# Patient Record
Sex: Female | Born: 1935 | Race: White | Hispanic: No | Marital: Married | Smoking: Former smoker
Health system: Southern US, Community
[De-identification: ages and names within clinical notes are randomized; demographics above are authoritative.]

## PROBLEM LIST (undated history)

## (undated) DIAGNOSIS — K2100 Gastro-esophageal reflux disease with esophagitis, without bleeding: Secondary | ICD-10-CM

## (undated) DIAGNOSIS — R002 Palpitations: Secondary | ICD-10-CM

## (undated) DIAGNOSIS — R739 Hyperglycemia, unspecified: Secondary | ICD-10-CM

## (undated) DIAGNOSIS — F419 Anxiety disorder, unspecified: Secondary | ICD-10-CM

## (undated) DIAGNOSIS — I10 Essential (primary) hypertension: Secondary | ICD-10-CM

## (undated) DIAGNOSIS — R9389 Abnormal findings on diagnostic imaging of other specified body structures: Secondary | ICD-10-CM

## (undated) DIAGNOSIS — M810 Age-related osteoporosis without current pathological fracture: Secondary | ICD-10-CM

## (undated) DIAGNOSIS — M1990 Unspecified osteoarthritis, unspecified site: Secondary | ICD-10-CM

## (undated) DIAGNOSIS — K219 Gastro-esophageal reflux disease without esophagitis: Secondary | ICD-10-CM

## (undated) DIAGNOSIS — Z862 Personal history of diseases of the blood and blood-forming organs and certain disorders involving the immune mechanism: Secondary | ICD-10-CM

## (undated) DIAGNOSIS — E7800 Pure hypercholesterolemia, unspecified: Secondary | ICD-10-CM

## (undated) DIAGNOSIS — L409 Psoriasis, unspecified: Secondary | ICD-10-CM

## (undated) DIAGNOSIS — N6019 Diffuse cystic mastopathy of unspecified breast: Secondary | ICD-10-CM

## (undated) DIAGNOSIS — R7303 Prediabetes: Secondary | ICD-10-CM

## (undated) HISTORY — DX: Abnormal findings on diagnostic imaging of other specified body structures: R93.89

## (undated) HISTORY — DX: Essential (primary) hypertension: I10

## (undated) HISTORY — DX: Unspecified osteoarthritis, unspecified site: M19.90

## (undated) HISTORY — DX: Gastro-esophageal reflux disease without esophagitis: K21.9

## (undated) HISTORY — DX: Age-related osteoporosis without current pathological fracture: M81.0

## (undated) HISTORY — DX: Psoriasis, unspecified: L40.9

## (undated) HISTORY — DX: Hyperglycemia, unspecified: R73.9

## (undated) HISTORY — DX: Anxiety disorder, unspecified: F41.9

## (undated) HISTORY — DX: Pure hypercholesterolemia, unspecified: E78.00

## (undated) HISTORY — DX: Diffuse cystic mastopathy of unspecified breast: N60.19

## (undated) HISTORY — DX: Personal history of diseases of the blood and blood-forming organs and certain disorders involving the immune mechanism: Z86.2

---

## 1974-11-29 HISTORY — PX: BREAST EXCISIONAL BIOPSY: SUR124

## 1978-11-29 HISTORY — PX: ABDOMINAL HYSTERECTOMY: SHX81

## 1998-08-20 ENCOUNTER — Other Ambulatory Visit: Admission: RE | Admit: 1998-08-20 | Discharge: 1998-08-20 | Payer: Self-pay | Admitting: Gynecology

## 1998-08-22 ENCOUNTER — Other Ambulatory Visit: Admission: RE | Admit: 1998-08-22 | Discharge: 1998-08-22 | Payer: Self-pay | Admitting: Gynecology

## 1999-08-25 ENCOUNTER — Other Ambulatory Visit: Admission: RE | Admit: 1999-08-25 | Discharge: 1999-08-25 | Payer: Self-pay | Admitting: Gynecology

## 2000-08-24 ENCOUNTER — Other Ambulatory Visit: Admission: RE | Admit: 2000-08-24 | Discharge: 2000-08-24 | Payer: Self-pay | Admitting: Gynecology

## 2001-08-22 ENCOUNTER — Other Ambulatory Visit: Admission: RE | Admit: 2001-08-22 | Discharge: 2001-08-22 | Payer: Self-pay | Admitting: Gynecology

## 2002-08-29 ENCOUNTER — Other Ambulatory Visit: Admission: RE | Admit: 2002-08-29 | Discharge: 2002-08-29 | Payer: Self-pay | Admitting: Gynecology

## 2003-09-09 ENCOUNTER — Other Ambulatory Visit: Admission: RE | Admit: 2003-09-09 | Discharge: 2003-09-09 | Payer: Self-pay | Admitting: Gynecology

## 2004-10-14 ENCOUNTER — Other Ambulatory Visit: Admission: RE | Admit: 2004-10-14 | Discharge: 2004-10-14 | Payer: Self-pay | Admitting: Gynecology

## 2005-02-04 ENCOUNTER — Ambulatory Visit: Payer: Self-pay | Admitting: Internal Medicine

## 2005-03-08 ENCOUNTER — Ambulatory Visit: Payer: Self-pay | Admitting: Internal Medicine

## 2005-09-15 ENCOUNTER — Ambulatory Visit: Payer: Self-pay | Admitting: Internal Medicine

## 2005-10-25 ENCOUNTER — Other Ambulatory Visit: Admission: RE | Admit: 2005-10-25 | Discharge: 2005-10-25 | Payer: Self-pay | Admitting: Gynecology

## 2005-12-28 ENCOUNTER — Ambulatory Visit: Payer: Self-pay | Admitting: Internal Medicine

## 2006-02-24 ENCOUNTER — Ambulatory Visit: Payer: Self-pay | Admitting: Internal Medicine

## 2006-03-21 ENCOUNTER — Ambulatory Visit: Payer: Self-pay | Admitting: Internal Medicine

## 2006-08-30 ENCOUNTER — Ambulatory Visit: Payer: Self-pay | Admitting: Internal Medicine

## 2006-11-07 ENCOUNTER — Other Ambulatory Visit: Admission: RE | Admit: 2006-11-07 | Discharge: 2006-11-07 | Payer: Self-pay | Admitting: Gynecology

## 2007-02-27 ENCOUNTER — Ambulatory Visit: Payer: Self-pay | Admitting: Internal Medicine

## 2007-09-06 ENCOUNTER — Ambulatory Visit: Payer: Self-pay | Admitting: Internal Medicine

## 2007-11-13 ENCOUNTER — Other Ambulatory Visit: Admission: RE | Admit: 2007-11-13 | Discharge: 2007-11-13 | Payer: Self-pay | Admitting: Gynecology

## 2008-02-05 ENCOUNTER — Ambulatory Visit: Payer: Self-pay | Admitting: Unknown Physician Specialty

## 2008-02-28 ENCOUNTER — Ambulatory Visit: Payer: Self-pay | Admitting: Internal Medicine

## 2008-04-24 ENCOUNTER — Ambulatory Visit: Payer: Self-pay | Admitting: Rheumatology

## 2008-11-05 ENCOUNTER — Ambulatory Visit: Payer: Self-pay | Admitting: Internal Medicine

## 2008-11-15 ENCOUNTER — Ambulatory Visit: Payer: Self-pay | Admitting: Family Medicine

## 2008-11-18 ENCOUNTER — Emergency Department: Payer: Self-pay | Admitting: Emergency Medicine

## 2008-12-11 ENCOUNTER — Other Ambulatory Visit: Admission: RE | Admit: 2008-12-11 | Discharge: 2008-12-11 | Payer: Self-pay | Admitting: Gynecology

## 2008-12-25 ENCOUNTER — Ambulatory Visit: Payer: Self-pay | Admitting: Unknown Physician Specialty

## 2009-03-03 ENCOUNTER — Ambulatory Visit: Payer: Self-pay | Admitting: Internal Medicine

## 2009-03-19 ENCOUNTER — Ambulatory Visit: Payer: Self-pay | Admitting: Unknown Physician Specialty

## 2009-09-03 ENCOUNTER — Ambulatory Visit: Payer: Self-pay | Admitting: Surgery

## 2010-03-09 ENCOUNTER — Ambulatory Visit: Payer: Self-pay | Admitting: Internal Medicine

## 2010-06-22 ENCOUNTER — Ambulatory Visit: Payer: Self-pay | Admitting: Family Medicine

## 2010-06-22 DIAGNOSIS — T7840XA Allergy, unspecified, initial encounter: Secondary | ICD-10-CM | POA: Insufficient documentation

## 2010-06-22 DIAGNOSIS — E7800 Pure hypercholesterolemia, unspecified: Secondary | ICD-10-CM | POA: Insufficient documentation

## 2010-06-22 DIAGNOSIS — I10 Essential (primary) hypertension: Secondary | ICD-10-CM

## 2010-12-29 NOTE — Assessment & Plan Note (Signed)
Summary: sore throat/jbb   Vital Signs:  Patient Profile:   77 Years Old Female CC:      Sore Throat / rwt Height:     58.5 inches Weight:      157 pounds BMI:     32.37 O2 Sat:      97 % O2 treatment:    Room Air Temp:     97.9 degrees F oral Pulse rate:   74 / minute Pulse rhythm:   regular Resp:     20 per minute BP sitting:   152 / 83  (right arm)  Pt. in pain?   yes    Location:   neck    Intensity:   2    Type:       burning  Vitals Entered By: Levonne Spiller EMT-P (June 22, 2010 1:26 PM)              Is Patient Diabetic? No      Current Allergies: ! VIOXX ! * NAPROXEN SODIUMHistory of Present Illness History from: patient Reason for visit: see chief complaint Chief Complaint: Sore Throat / rwt History of Present Illness: Patient reports that she has a sore throat that started 3 days ago. Described as scratchy. Last night, however, the sore throat became severe and she felt a swelling in the right side of her neck. She was afraid to lay flat and slept upright in a recliner. She sucked on a lozenge and reports that it burned her throat towards the end, but soonn had relief. Then went to bed. This morning she still has a slight sore throat and clear nasal drainage. Has a slight dry cough.   She has been taking doxycycline and hydroyzine for a week. given to her by her dermatologist for a skin condition. no other changes in meds or foods.    REVIEW OF SYSTEMS Constitutional Symptoms      Denies fever, chills, night sweats, weight loss, weight gain, and fatigue.  Eyes       Denies change in vision, eye pain, eye discharge, glasses, contact lenses, and eye surgery. Ear/Nose/Throat/Mouth       Complains of sore throat.      Denies hearing loss/aids, change in hearing, ear pain, ear discharge, dizziness, frequent runny nose, frequent nose bleeds, sinus problems, hoarseness, and tooth pain or bleeding.  Respiratory       Complains of dry cough.      Denies productive  cough, wheezing, shortness of breath, asthma, bronchitis, and emphysema/COPD.  Cardiovascular       Denies murmurs, chest pain, and tires easily with exhertion.    Gastrointestinal       Denies stomach pain, nausea/vomiting, diarrhea, constipation, blood in bowel movements, and indigestion. Genitourniary       Denies painful urination, kidney stones, and loss of urinary control. Neurological       Denies paralysis, seizures, and fainting/blackouts. Musculoskeletal       Denies muscle pain, joint pain, joint stiffness, decreased range of motion, redness, swelling, muscle weakness, and gout.  Skin       Denies bruising, unusual mles/lumps or sores, and hair/skin or nail changes.      Comments: Being treated - improving Psych       Denies mood changes, temper/anger issues, anxiety/stress, speech problems, depression, and sleep problems.  Past History:  Past Medical History: Hyperlipidemia Hypertension  Past Surgical History: colonoscopy - 1990, 1995, 2000, 2005  Family History: Father: D:62 auto accident Mother:  D:77 heart Siblings: 3 sisters - deceased 1 brother - alive  Social History: Married Never Smoked Alcohol use-no Drug use-no Smoking Status:  never Drug Use:  no Physical Exam General appearance: well developed, well nourished, no acute distress Eyes: conjunctivae and lids normal Pupils: equal, round, reactive to light Nasal: swollen red turbinates with congestion Oral/Pharynx: beefy red  swollen tonsils w/o exudate Neck: neck supple,  trachea midline, no masses - right sided shotty nontender lymphadenopathy Chest/Lungs: no rales, wheezes, or rhonchi bilateral, breath sounds equal without effort Heart: regular rate and  rhythm, no murmur MSE: oriented to time, place, and person Assessment New Problems: ACUTE PHARYNGITIS (ICD-462) ALLERGIC REACTION (ICD-995.3) HYPERTENSION (ICD-401.9) HYPERLIPIDEMIA (ICD-272.4)   Plan New Orders: New Patient Level II  [99202] Rapid Strep [04540] Planning Comments:   Patient was told to discontinue the doxycycline and return for re-evaluation in 2-3 days. If she were to have any new or concerning symptoms recur or occur, she was encouraged to have this evaluated right away. She was told to continue the hydroxyzine as needed itching.   The patient and/or caregiver has been counseled thoroughly with regard to medications prescribed including dosage, schedule, interactions, rationale for use, and possible side effects and they verbalize understanding.  Diagnoses and expected course of recovery discussed and will return if not improved as expected or if the condition worsens. Patient and/or caregiver verbalized understanding.   Orders Added: 1)  New Patient Level II [99202] 2)  Rapid Strep [98119]  The patient was informed that there is no on-call provider or services available at this clinic during off-hours (when the clinic is closed).  If the patient developed a problem or concern that required immediate attention, the patient was advised to go the the nearest available urgent care or emergency department for medical care.  The patient verbalized understanding.     The risks, benefits and possible side effects of the treatments and tests were explained clearly to the patient and the patient verbalized understanding.

## 2011-03-08 ENCOUNTER — Observation Stay: Payer: Self-pay | Admitting: Internal Medicine

## 2011-03-23 ENCOUNTER — Ambulatory Visit: Payer: Self-pay | Admitting: Specialist

## 2011-04-05 ENCOUNTER — Ambulatory Visit: Payer: Self-pay | Admitting: Specialist

## 2011-04-13 ENCOUNTER — Ambulatory Visit: Payer: Self-pay | Admitting: Internal Medicine

## 2011-09-27 ENCOUNTER — Ambulatory Visit: Payer: Self-pay | Admitting: Unknown Physician Specialty

## 2011-12-07 DIAGNOSIS — M545 Low back pain: Secondary | ICD-10-CM | POA: Diagnosis not present

## 2012-03-08 DIAGNOSIS — Z79899 Other long term (current) drug therapy: Secondary | ICD-10-CM | POA: Diagnosis not present

## 2012-03-08 DIAGNOSIS — E7800 Pure hypercholesterolemia, unspecified: Secondary | ICD-10-CM | POA: Diagnosis not present

## 2012-03-08 DIAGNOSIS — R7989 Other specified abnormal findings of blood chemistry: Secondary | ICD-10-CM | POA: Diagnosis not present

## 2012-03-08 DIAGNOSIS — I10 Essential (primary) hypertension: Secondary | ICD-10-CM | POA: Diagnosis not present

## 2012-03-22 DIAGNOSIS — K219 Gastro-esophageal reflux disease without esophagitis: Secondary | ICD-10-CM | POA: Diagnosis not present

## 2012-03-22 DIAGNOSIS — R109 Unspecified abdominal pain: Secondary | ICD-10-CM | POA: Diagnosis not present

## 2012-03-22 DIAGNOSIS — R1084 Generalized abdominal pain: Secondary | ICD-10-CM | POA: Diagnosis not present

## 2012-03-23 ENCOUNTER — Ambulatory Visit: Payer: Self-pay | Admitting: Internal Medicine

## 2012-03-23 DIAGNOSIS — R110 Nausea: Secondary | ICD-10-CM | POA: Diagnosis not present

## 2012-03-23 DIAGNOSIS — N289 Disorder of kidney and ureter, unspecified: Secondary | ICD-10-CM | POA: Diagnosis not present

## 2012-03-23 DIAGNOSIS — R109 Unspecified abdominal pain: Secondary | ICD-10-CM | POA: Diagnosis not present

## 2012-03-23 DIAGNOSIS — R1084 Generalized abdominal pain: Secondary | ICD-10-CM | POA: Diagnosis not present

## 2012-03-29 ENCOUNTER — Ambulatory Visit: Payer: Self-pay | Admitting: Internal Medicine

## 2012-03-29 DIAGNOSIS — R109 Unspecified abdominal pain: Secondary | ICD-10-CM | POA: Diagnosis not present

## 2012-04-12 DIAGNOSIS — E7800 Pure hypercholesterolemia, unspecified: Secondary | ICD-10-CM | POA: Diagnosis not present

## 2012-04-12 DIAGNOSIS — I10 Essential (primary) hypertension: Secondary | ICD-10-CM | POA: Diagnosis not present

## 2012-04-12 DIAGNOSIS — R1032 Left lower quadrant pain: Secondary | ICD-10-CM | POA: Diagnosis not present

## 2012-04-14 DIAGNOSIS — N83209 Unspecified ovarian cyst, unspecified side: Secondary | ICD-10-CM | POA: Diagnosis not present

## 2012-04-14 DIAGNOSIS — N949 Unspecified condition associated with female genital organs and menstrual cycle: Secondary | ICD-10-CM | POA: Diagnosis not present

## 2012-05-16 ENCOUNTER — Ambulatory Visit: Payer: Self-pay | Admitting: Internal Medicine

## 2012-05-16 DIAGNOSIS — Z1231 Encounter for screening mammogram for malignant neoplasm of breast: Secondary | ICD-10-CM | POA: Diagnosis not present

## 2012-06-07 DIAGNOSIS — L570 Actinic keratosis: Secondary | ICD-10-CM | POA: Diagnosis not present

## 2012-06-07 DIAGNOSIS — L821 Other seborrheic keratosis: Secondary | ICD-10-CM | POA: Diagnosis not present

## 2012-06-07 DIAGNOSIS — L940 Localized scleroderma [morphea]: Secondary | ICD-10-CM | POA: Diagnosis not present

## 2012-06-20 DIAGNOSIS — R1310 Dysphagia, unspecified: Secondary | ICD-10-CM | POA: Diagnosis not present

## 2012-06-20 DIAGNOSIS — R1032 Left lower quadrant pain: Secondary | ICD-10-CM | POA: Diagnosis not present

## 2012-06-20 DIAGNOSIS — Z86010 Personal history of colonic polyps: Secondary | ICD-10-CM | POA: Diagnosis not present

## 2012-06-20 DIAGNOSIS — K219 Gastro-esophageal reflux disease without esophagitis: Secondary | ICD-10-CM | POA: Diagnosis not present

## 2012-07-19 DIAGNOSIS — E7800 Pure hypercholesterolemia, unspecified: Secondary | ICD-10-CM | POA: Diagnosis not present

## 2012-07-19 DIAGNOSIS — R7989 Other specified abnormal findings of blood chemistry: Secondary | ICD-10-CM | POA: Diagnosis not present

## 2012-07-19 DIAGNOSIS — R5381 Other malaise: Secondary | ICD-10-CM | POA: Diagnosis not present

## 2012-07-19 DIAGNOSIS — R5383 Other fatigue: Secondary | ICD-10-CM | POA: Diagnosis not present

## 2012-07-19 DIAGNOSIS — I10 Essential (primary) hypertension: Secondary | ICD-10-CM | POA: Diagnosis not present

## 2012-07-19 DIAGNOSIS — Z79899 Other long term (current) drug therapy: Secondary | ICD-10-CM | POA: Diagnosis not present

## 2012-07-25 ENCOUNTER — Ambulatory Visit: Payer: Self-pay | Admitting: Unknown Physician Specialty

## 2012-07-25 DIAGNOSIS — D126 Benign neoplasm of colon, unspecified: Secondary | ICD-10-CM | POA: Diagnosis not present

## 2012-07-25 DIAGNOSIS — R002 Palpitations: Secondary | ICD-10-CM | POA: Diagnosis not present

## 2012-07-25 DIAGNOSIS — R7309 Other abnormal glucose: Secondary | ICD-10-CM | POA: Diagnosis not present

## 2012-07-25 DIAGNOSIS — K5730 Diverticulosis of large intestine without perforation or abscess without bleeding: Secondary | ICD-10-CM | POA: Diagnosis not present

## 2012-07-25 DIAGNOSIS — L408 Other psoriasis: Secondary | ICD-10-CM | POA: Diagnosis not present

## 2012-07-25 DIAGNOSIS — Z86010 Personal history of colonic polyps: Secondary | ICD-10-CM | POA: Diagnosis not present

## 2012-07-25 DIAGNOSIS — Z87891 Personal history of nicotine dependence: Secondary | ICD-10-CM | POA: Diagnosis not present

## 2012-07-25 DIAGNOSIS — R109 Unspecified abdominal pain: Secondary | ICD-10-CM | POA: Diagnosis not present

## 2012-07-25 DIAGNOSIS — Z7982 Long term (current) use of aspirin: Secondary | ICD-10-CM | POA: Diagnosis not present

## 2012-07-25 DIAGNOSIS — R1032 Left lower quadrant pain: Secondary | ICD-10-CM | POA: Diagnosis not present

## 2012-07-25 DIAGNOSIS — I10 Essential (primary) hypertension: Secondary | ICD-10-CM | POA: Diagnosis not present

## 2012-07-25 DIAGNOSIS — K648 Other hemorrhoids: Secondary | ICD-10-CM | POA: Diagnosis not present

## 2012-07-25 DIAGNOSIS — Z800 Family history of malignant neoplasm of digestive organs: Secondary | ICD-10-CM | POA: Diagnosis not present

## 2012-07-25 DIAGNOSIS — F411 Generalized anxiety disorder: Secondary | ICD-10-CM | POA: Diagnosis not present

## 2012-07-25 DIAGNOSIS — E785 Hyperlipidemia, unspecified: Secondary | ICD-10-CM | POA: Diagnosis not present

## 2012-07-25 DIAGNOSIS — K219 Gastro-esophageal reflux disease without esophagitis: Secondary | ICD-10-CM | POA: Diagnosis not present

## 2012-07-25 DIAGNOSIS — M810 Age-related osteoporosis without current pathological fracture: Secondary | ICD-10-CM | POA: Diagnosis not present

## 2012-07-25 DIAGNOSIS — R1310 Dysphagia, unspecified: Secondary | ICD-10-CM | POA: Diagnosis not present

## 2012-07-25 DIAGNOSIS — Z79899 Other long term (current) drug therapy: Secondary | ICD-10-CM | POA: Diagnosis not present

## 2012-07-25 DIAGNOSIS — M1990 Unspecified osteoarthritis, unspecified site: Secondary | ICD-10-CM | POA: Diagnosis not present

## 2012-07-25 LAB — HM COLONOSCOPY

## 2012-07-28 LAB — PATHOLOGY REPORT

## 2012-08-16 DIAGNOSIS — Z23 Encounter for immunization: Secondary | ICD-10-CM | POA: Diagnosis not present

## 2012-09-05 DIAGNOSIS — M25549 Pain in joints of unspecified hand: Secondary | ICD-10-CM | POA: Diagnosis not present

## 2012-09-05 DIAGNOSIS — M19049 Primary osteoarthritis, unspecified hand: Secondary | ICD-10-CM | POA: Diagnosis not present

## 2012-09-18 ENCOUNTER — Encounter: Payer: Self-pay | Admitting: Rheumatology

## 2012-09-18 DIAGNOSIS — IMO0001 Reserved for inherently not codable concepts without codable children: Secondary | ICD-10-CM | POA: Diagnosis not present

## 2012-09-18 DIAGNOSIS — M19049 Primary osteoarthritis, unspecified hand: Secondary | ICD-10-CM | POA: Diagnosis not present

## 2012-09-18 DIAGNOSIS — M25549 Pain in joints of unspecified hand: Secondary | ICD-10-CM | POA: Diagnosis not present

## 2012-09-29 ENCOUNTER — Encounter: Payer: Self-pay | Admitting: Rheumatology

## 2012-09-29 DIAGNOSIS — M25549 Pain in joints of unspecified hand: Secondary | ICD-10-CM | POA: Diagnosis not present

## 2012-09-29 DIAGNOSIS — M19049 Primary osteoarthritis, unspecified hand: Secondary | ICD-10-CM | POA: Diagnosis not present

## 2012-09-29 DIAGNOSIS — IMO0001 Reserved for inherently not codable concepts without codable children: Secondary | ICD-10-CM | POA: Diagnosis not present

## 2012-10-29 ENCOUNTER — Encounter: Payer: Self-pay | Admitting: Rheumatology

## 2012-11-02 DIAGNOSIS — M19049 Primary osteoarthritis, unspecified hand: Secondary | ICD-10-CM | POA: Diagnosis not present

## 2012-11-02 DIAGNOSIS — M25549 Pain in joints of unspecified hand: Secondary | ICD-10-CM | POA: Diagnosis not present

## 2012-11-07 ENCOUNTER — Encounter: Payer: Self-pay | Admitting: *Deleted

## 2012-11-07 DIAGNOSIS — Z86010 Personal history of colonic polyps: Secondary | ICD-10-CM

## 2012-11-08 ENCOUNTER — Ambulatory Visit (INDEPENDENT_AMBULATORY_CARE_PROVIDER_SITE_OTHER): Payer: Medicare Other | Admitting: Internal Medicine

## 2012-11-08 ENCOUNTER — Encounter: Payer: Self-pay | Admitting: Internal Medicine

## 2012-11-08 VITALS — BP 132/62 | HR 64 | Temp 97.8°F | Ht 58.5 in | Wt 155.2 lb

## 2012-11-08 DIAGNOSIS — I10 Essential (primary) hypertension: Secondary | ICD-10-CM

## 2012-11-08 DIAGNOSIS — R002 Palpitations: Secondary | ICD-10-CM | POA: Diagnosis not present

## 2012-11-08 DIAGNOSIS — R7309 Other abnormal glucose: Secondary | ICD-10-CM | POA: Diagnosis not present

## 2012-11-08 DIAGNOSIS — E119 Type 2 diabetes mellitus without complications: Secondary | ICD-10-CM | POA: Insufficient documentation

## 2012-11-08 DIAGNOSIS — M810 Age-related osteoporosis without current pathological fracture: Secondary | ICD-10-CM | POA: Diagnosis not present

## 2012-11-08 DIAGNOSIS — E785 Hyperlipidemia, unspecified: Secondary | ICD-10-CM | POA: Diagnosis not present

## 2012-11-08 DIAGNOSIS — E782 Mixed hyperlipidemia: Secondary | ICD-10-CM | POA: Diagnosis not present

## 2012-11-08 DIAGNOSIS — R739 Hyperglycemia, unspecified: Secondary | ICD-10-CM

## 2012-11-08 DIAGNOSIS — K219 Gastro-esophageal reflux disease without esophagitis: Secondary | ICD-10-CM | POA: Insufficient documentation

## 2012-11-08 DIAGNOSIS — E1165 Type 2 diabetes mellitus with hyperglycemia: Secondary | ICD-10-CM | POA: Insufficient documentation

## 2012-11-08 LAB — CBC WITH DIFFERENTIAL/PLATELET
Basophils Absolute: 0 10*3/uL (ref 0.0–0.1)
Eosinophils Relative: 0.6 % (ref 0.0–5.0)
HCT: 39.8 % (ref 36.0–46.0)
Lymphocytes Relative: 22.4 % (ref 12.0–46.0)
Lymphs Abs: 2.2 10*3/uL (ref 0.7–4.0)
Monocytes Relative: 8.6 % (ref 3.0–12.0)
Neutrophils Relative %: 68.1 % (ref 43.0–77.0)
Platelets: 213 10*3/uL (ref 150.0–400.0)
RDW: 13.3 % (ref 11.5–14.6)
WBC: 9.8 10*3/uL (ref 4.5–10.5)

## 2012-11-08 LAB — COMPREHENSIVE METABOLIC PANEL
ALT: 17 U/L (ref 0–35)
Albumin: 4.3 g/dL (ref 3.5–5.2)
CO2: 30 mEq/L (ref 19–32)
Calcium: 9 mg/dL (ref 8.4–10.5)
Chloride: 100 mEq/L (ref 96–112)
GFR: 56.22 mL/min — ABNORMAL LOW (ref >60.00)
Glucose, Bld: 81 mg/dL (ref 70–99)
Potassium: 4.5 mEq/L (ref 3.5–5.1)
Sodium: 139 mEq/L (ref 135–145)
Total Bilirubin: 0.7 mg/dL (ref 0.3–1.2)
Total Protein: 7.3 g/dL (ref 6.0–8.3)

## 2012-11-08 LAB — TSH: TSH: 1.9 u[IU]/mL (ref 0.35–5.50)

## 2012-11-08 NOTE — Progress Notes (Signed)
Subjective:    Patient ID: Teresa Wade, female    DOB: 1931/12/18, 78 y.o.   MRN: [id number]  HPI 78 year old female with past history of palpitations, hypercholesterolemia, hypertension and FCD who comes in today to follow up on these issues as well as for a complete physical exam.  She states she has noticed problems recently with increased palpitations.  Makes her feel a little lightheaded and unsteady.  Can feel it skipping.  Has been more noticeable over the last month and worse over the last week.  Several days ago, symptoms lasted all night.  Notices more at night.  Minimal sob.  No chest pain.  States is makes her feel she needs to take a deep breath.  She also has osteoarthritis in her thumbs and hands.  S/p injection.  She did have a colonoscopy recently.  Had mammogram and flu shot also.    Past Medical History  Diagnosis Date  . Hypertension   . Anxiety   . Osteoporosis     s/p Actonel, Reclast (last 2011)  . Emphysema of lung   . Hypercholesterolemia   . GERD (gastroesophageal reflux disease)   . Fibrocystic disease of breast   . History of thrombocytopenia   . Abnormal chest CT     right hilar cyst  . Psoriasis   . Hyperglycemia   . Osteoarthritis     Current Outpatient Prescriptions on File Prior to Visit  Medication Sig Dispense Refill  . acebutolol (SECTRAL) 200 MG capsule Two capsules in the am and one in the pm      . calcium carbonate (OS-CAL) 1250 MG chewable tablet Chew 1 tablet by mouth 2 (two) times daily.      Marland Kitchen esomeprazole (NEXIUM) 40 MG capsule Take 40 mg by mouth 2 (two) times daily.      Marland Kitchen lisinopril (PRINIVIL,ZESTRIL) 10 MG tablet Take 10 mg by mouth daily.      . Omega-3 Fatty Acids (FISH OIL TRIPLE STRENGTH) 1400 MG CAPS Take 1 tablet by mouth daily.      . sertraline (ZOLOFT) 25 MG tablet Take 25 mg by mouth daily.      . simvastatin (ZOCOR) 40 MG tablet Take 40 mg by mouth every evening.        Review of Systems Patient denies any headache,  lightheadedness or dizziness.  No significant sinus or allergy symptoms.  No chest pain.  Increased  Palpitations as outlined.  No increased shortness of breath, but she does state when the above occurs - she feels she cannot get a good breath.  No cough or congestion.  No nausea or vomiting.  No abdominal pain or cramping.  No bowel change, such as diarrhea, constipation, BRBPR or melana.  No urine change.        Objective:   Physical Exam Filed Vitals:   11/08/12 0932  BP: 132/62  Pulse: 64  Temp: 97.8 F (73.29 C)   78 year old female in no acute distress.   HEENT:  Nares- clear.  Oropharynx - without lesions. NECK:  Supple.  Nontender.  No audible bruit.  HEART:  Appears to be regular. LUNGS:  No crackles or wheezing audible.  Respirations even and unlabored.  RADIAL PULSE:  Equal bilaterally.    BREASTS:  No nipple discharge or nipple retraction present.  Could not appreciate any distinct nodules or axillary adenopathy.  ABDOMEN:  Soft, nontender.  Bowel sounds present and normal.  No audible abdominal bruit.  GU:  Normal external genitalia.  Vaginal vault without lesions.  S/p hysterectomy.  Could not appreciate any adnexal masses or tenderness.   RECTAL:  Heme negative.   EXTREMITIES:  No increased edema present.  DP pulses palpable and equal bilaterally.          Assessment & Plan:  GI.  Colonoscopy 02/05/08 - internal hemorrhoids.  Currently  Doing well.  Follow.   HISTORY OF OVARIAN CYST.  Was followed by gyn.  Has had serial ultrasounds.  They have released her and felt no further w/up warranted.  Had CT abdomen/pelvis - negative for acute abnoramlity (03/29/12).    PULMONARY.  Followed by Dr Meredeth Ide.  Breathing stable.  Follow.   INCREASED PSYCHOSOCIAL STRESSORS.  On Zoloft.  Doing well.  Follow.    CARDIOVASCULAR.  Increased palpitations as outlined.  EKG obtained and revealed SR with minimal ST depression in avL and TWI in v1 and v2.  Given change in symptoms and worsening  recently, will refer to cardiology for evaluation and further treatment.  Pt comfortable with this plan.    HEALTH MAINTENANCE.  Physical today.  Colonoscopy as outlined.  She is s/p hysterectomy and does not require yearly pap smears.  Need to obtain copies of last mammogram.

## 2012-11-08 NOTE — Patient Instructions (Addendum)
It was nice seeing you today.  I am sorry you have not been feeling well.  We will schedule an appt to see Dr Lady Gary.  Let me know if you have any problems.

## 2012-11-08 NOTE — Assessment & Plan Note (Signed)
Received IV Reclast - 04/2010.  Had six years of bisphosphonate therapy.  Off now.  Continue calcium and vitamin D.

## 2012-11-12 ENCOUNTER — Encounter: Payer: Self-pay | Admitting: Internal Medicine

## 2012-11-12 NOTE — Assessment & Plan Note (Signed)
Symptoms controlled on Nexium.  Follow.

## 2012-11-12 NOTE — Assessment & Plan Note (Signed)
Low carb diet and exercise.  Check met b and a1c.   

## 2012-11-12 NOTE — Assessment & Plan Note (Signed)
Blood pressure under good control.  Same medication.  Check metabolic panel.    

## 2012-11-12 NOTE — Assessment & Plan Note (Signed)
Low cholesterol diet and exercise.  On simvastatin.  Check lipid panel and liver function.    

## 2012-11-16 ENCOUNTER — Emergency Department: Payer: Self-pay | Admitting: Emergency Medicine

## 2012-11-16 DIAGNOSIS — I4949 Other premature depolarization: Secondary | ICD-10-CM | POA: Diagnosis not present

## 2012-11-16 DIAGNOSIS — E785 Hyperlipidemia, unspecified: Secondary | ICD-10-CM | POA: Diagnosis not present

## 2012-11-16 DIAGNOSIS — Z9079 Acquired absence of other genital organ(s): Secondary | ICD-10-CM | POA: Diagnosis not present

## 2012-11-16 DIAGNOSIS — Z79899 Other long term (current) drug therapy: Secondary | ICD-10-CM | POA: Diagnosis not present

## 2012-11-16 DIAGNOSIS — K219 Gastro-esophageal reflux disease without esophagitis: Secondary | ICD-10-CM | POA: Diagnosis not present

## 2012-11-16 DIAGNOSIS — R002 Palpitations: Secondary | ICD-10-CM | POA: Diagnosis not present

## 2012-11-16 DIAGNOSIS — Z87891 Personal history of nicotine dependence: Secondary | ICD-10-CM | POA: Diagnosis not present

## 2012-11-17 LAB — COMPREHENSIVE METABOLIC PANEL
Alkaline Phosphatase: 109 U/L (ref 50–136)
Anion Gap: 7 (ref 7–16)
Bilirubin,Total: 0.4 mg/dL (ref 0.2–1.0)
Chloride: 107 mmol/L (ref 98–107)
Co2: 25 mmol/L (ref 21–32)
Creatinine: 1.05 mg/dL (ref 0.60–1.30)
EGFR (African American): 59 — ABNORMAL LOW
EGFR (Non-African Amer.): 51 — ABNORMAL LOW
Glucose: 86 mg/dL (ref 65–99)
Osmolality: 281 (ref 275–301)
Potassium: 4.4 mmol/L (ref 3.5–5.1)
Sodium: 139 mmol/L (ref 136–145)

## 2012-11-17 LAB — CK TOTAL AND CKMB (NOT AT ARMC)
CK, Total: 88 U/L (ref 21–215)
CK-MB: 2 ng/mL (ref 0.5–3.6)

## 2012-11-17 LAB — CBC
MCH: 29.8 pg (ref 26.0–34.0)
MCV: 91 fL (ref 80–100)
Platelet: 193 10*3/uL (ref 150–440)
RBC: 4.26 10*6/uL (ref 3.80–5.20)
RDW: 13.5 % (ref 11.5–14.5)
WBC: 7.8 10*3/uL (ref 3.6–11.0)

## 2012-11-17 LAB — TSH: Thyroid Stimulating Horm: 2.06 u[IU]/mL

## 2012-11-17 LAB — MAGNESIUM: Magnesium: 2.1 mg/dL

## 2012-11-17 LAB — TROPONIN I: Troponin-I: <0.02 ng/mL

## 2012-11-23 DIAGNOSIS — R002 Palpitations: Secondary | ICD-10-CM | POA: Diagnosis not present

## 2012-12-20 DIAGNOSIS — I10 Essential (primary) hypertension: Secondary | ICD-10-CM | POA: Diagnosis not present

## 2012-12-20 DIAGNOSIS — I491 Atrial premature depolarization: Secondary | ICD-10-CM | POA: Diagnosis not present

## 2012-12-20 DIAGNOSIS — R002 Palpitations: Secondary | ICD-10-CM | POA: Diagnosis not present

## 2012-12-21 ENCOUNTER — Other Ambulatory Visit: Payer: Self-pay | Admitting: *Deleted

## 2012-12-21 ENCOUNTER — Other Ambulatory Visit: Payer: Self-pay | Admitting: Internal Medicine

## 2012-12-21 NOTE — Telephone Encounter (Signed)
acebutolol (SECTRAL) 200 MG capsule

## 2012-12-22 MED ORDER — ACEBUTOLOL HCL 200 MG PO CAPS: ORAL_CAPSULE | ORAL | Status: DC

## 2012-12-22 NOTE — Telephone Encounter (Signed)
Per [facility] Dr. Lorin Picket you prescribed the acebutolol

## 2012-12-22 NOTE — Telephone Encounter (Signed)
Need to confirm with pharmacy if I am the one that usually fill this medication or does cardiology.

## 2012-12-28 ENCOUNTER — Telehealth: Payer: Self-pay | Admitting: *Deleted

## 2012-12-28 MED ORDER — ACEBUTOLOL HCL 200 MG PO CAPS: ORAL_CAPSULE | ORAL | Status: DC

## 2012-12-28 NOTE — Telephone Encounter (Signed)
Patient called and said that she wanted her acebutolol prescription with 90 day supply. Patient has already picked up medication from pharmacy. She wanted to know if she took this prescription back would we call in other with 90 day supply. Patient said that it is cheaper this way. Please advise?

## 2012-12-28 NOTE — Telephone Encounter (Signed)
Sent in to pharmacy. Patient notified.  

## 2012-12-28 NOTE — Telephone Encounter (Signed)
I am ok to change it to 90 day supply.  They can add to what she has picked up and make 90 - i would think.   Let us know if problems.

## 2013-01-10 ENCOUNTER — Ambulatory Visit (INDEPENDENT_AMBULATORY_CARE_PROVIDER_SITE_OTHER): Payer: Medicare Other | Admitting: Internal Medicine

## 2013-01-10 ENCOUNTER — Encounter: Payer: Self-pay | Admitting: Internal Medicine

## 2013-01-10 VITALS — BP 118/62 | HR 66 | Temp 97.6°F | Ht 59.75 in | Wt 155.0 lb

## 2013-01-10 DIAGNOSIS — I10 Essential (primary) hypertension: Secondary | ICD-10-CM

## 2013-01-10 DIAGNOSIS — K219 Gastro-esophageal reflux disease without esophagitis: Secondary | ICD-10-CM

## 2013-01-10 DIAGNOSIS — M810 Age-related osteoporosis without current pathological fracture: Secondary | ICD-10-CM

## 2013-01-10 DIAGNOSIS — R7309 Other abnormal glucose: Secondary | ICD-10-CM

## 2013-01-10 DIAGNOSIS — R739 Hyperglycemia, unspecified: Secondary | ICD-10-CM

## 2013-01-10 DIAGNOSIS — E785 Hyperlipidemia, unspecified: Secondary | ICD-10-CM | POA: Diagnosis not present

## 2013-01-12 ENCOUNTER — Encounter: Payer: Self-pay | Admitting: Internal Medicine

## 2013-01-12 NOTE — Assessment & Plan Note (Signed)
Remains on simvastatin.  Low cholesterol diet and exercise.  Check lipid panel and liver function with next fasting labs.

## 2013-01-12 NOTE — Assessment & Plan Note (Signed)
Blood pressure under good control.  Same medication regimen.  Check metabolic panel with next labs.   

## 2013-01-12 NOTE — Assessment & Plan Note (Signed)
Low carb diet and exercise.  Follow metabolic panel and a1c.   

## 2013-01-12 NOTE — Assessment & Plan Note (Signed)
Check vitamin D level with next labs.  ?

## 2013-01-12 NOTE — Progress Notes (Signed)
Subjective:    Patient ID: Teresa Wade, female    DOB: 19-May-1932, 79 y.o.   MRN: [id number]  HPI 79 year old female with past history of palpitations, hypercholesterolemia, hypertension and FCD who comes in today for a scheduled follow up.  Is doing better.  Saw cardiology.  Had Holter.  Revealed PACs.  No changes made in her medications.  She denies any significant increased heart rate or palpitations now.  States she may occasionally notice some mild palpitations.  No chest pain or tightness.  No increased sob.  Eating and drinking well.  May occasionally notice a little abdominal discomfort, but no increased pain.  Overall she feels better.  Feels she is doing well.       Past Medical History  Diagnosis Date  . Hypertension   . Anxiety   . Osteoporosis     s/p Actonel, Reclast (last 2011)  . Emphysema of lung   . Hypercholesterolemia   . GERD (gastroesophageal reflux disease)   . Fibrocystic disease of breast   . History of thrombocytopenia   . Abnormal chest CT     right hilar cyst  . Psoriasis   . Hyperglycemia   . Osteoarthritis     Current Outpatient Prescriptions on File Prior to Visit  Medication Sig Dispense Refill  . acebutolol (SECTRAL) 200 MG capsule Two capsules in the am and one in the pm  270 capsule  1  . calcium carbonate (OS-CAL) 1250 MG chewable tablet Chew 1 tablet by mouth 2 (two) times daily.      Marland Kitchen esomeprazole (NEXIUM) 40 MG capsule Take 40 mg by mouth 2 (two) times daily.      Marland Kitchen lisinopril (PRINIVIL,ZESTRIL) 10 MG tablet Take 10 mg by mouth daily.      . magnesium oxide (MAG-OX) 400 MG tablet Take 400 mg by mouth daily.      . Multiple Vitamin (MULTIVITAMIN) tablet Take 1 tablet by mouth daily.      . Omega-3 Fatty Acids (FISH OIL TRIPLE STRENGTH) 1400 MG CAPS Take 1 tablet by mouth daily.      . sertraline (ZOLOFT) 25 MG tablet Take 25 mg by mouth daily.      . simvastatin (ZOCOR) 40 MG tablet Take 40 mg by mouth every evening.      Marland Kitchen  glucosamine-chondroitin 500-400 MG tablet        No current facility-administered medications on file prior to visit.    Review of Systems Patient denies any headache, lightheadedness or dizziness.  No significant sinus or allergy symptoms.  No chest pain.  No significant palpitations.  No increased shortness of breath.  No cough or congestion.  No nausea or vomiting.  No significant abdominal pain or cramping.  No acid reflux.  No bowel change, such as diarrhea, constipation, BRBPR or melana.  No urine change.        Objective:   Physical Exam  Filed Vitals:   01/10/13 0909  BP: 118/62  Pulse: 66  Temp: 97.6 F (87.1 C)   79 year old female in no acute distress.   HEENT:  Nares- clear.  Oropharynx - without lesions. NECK:  Supple.  Nontender.  No audible bruit.  HEART:  Appears to be regular. LUNGS:  No crackles or wheezing audible.  Respirations even and unlabored.  RADIAL PULSE:  Equal bilaterally.   ABDOMEN:  Soft, nontender.  Bowel sounds present and normal.  No audible abdominal bruit.  EXTREMITIES:  No increased edema present.  DP pulses palpable and equal bilaterally.          Assessment & Plan:  GI.  Colonoscopy 02/05/08 - internal hemorrhoids.  Currently  Doing well.  Follow.   HISTORY OF OVARIAN CYST.  Was followed by gyn.  Has had serial ultrasounds.  They have released her and felt no further w/up warranted.  Had CT abdomen/pelvis - negative for acute abnoramlity (03/29/12).    PULMONARY.  Followed by Dr Meredeth Ide.  Breathing stable.  Follow.   INCREASED PSYCHOSOCIAL STRESSORS.  On Zoloft.  Doing well.  Follow.    CARDIOVASCULAR.  Previously had problems with increased palpitations.  Improved now.  Saw cardiology.  Had holter.  PACs.  Doing well. Now.  Follow.      HEALTH MAINTENANCE.  Physical last visit.  Colonoscopy as outlined.  She is s/p hysterectomy and does not require yearly pap smears.  Need to obtain copies of last mammogram.

## 2013-01-12 NOTE — Assessment & Plan Note (Signed)
Symptoms controlled.  Same medication regimen.  Follow.   

## 2013-01-15 ENCOUNTER — Other Ambulatory Visit: Payer: Self-pay | Admitting: *Deleted

## 2013-01-15 MED ORDER — SIMVASTATIN 40 MG PO TABS: 40.0000 mg | ORAL_TABLET | Freq: Every evening | ORAL | Status: DC

## 2013-01-15 MED ORDER — LISINOPRIL 10 MG PO TABS: 10.0000 mg | ORAL_TABLET | Freq: Every day | ORAL | Status: DC

## 2013-01-15 NOTE — Telephone Encounter (Signed)
Sent in to pharmacy.  

## 2013-01-22 DIAGNOSIS — H43819 Vitreous degeneration, unspecified eye: Secondary | ICD-10-CM | POA: Diagnosis not present

## 2013-01-23 ENCOUNTER — Telehealth: Payer: Self-pay | Admitting: Internal Medicine

## 2013-01-23 MED ORDER — SIMVASTATIN 40 MG PO TABS: 40.0000 mg | ORAL_TABLET | Freq: Every evening | ORAL | Status: DC

## 2013-01-23 MED ORDER — LISINOPRIL 10 MG PO TABS: 10.0000 mg | ORAL_TABLET | Freq: Every day | ORAL | Status: DC

## 2013-01-23 NOTE — Telephone Encounter (Signed)
[facility] only [facility] day refill patient is needing a 90 day refill on these two medication . Can you change her refill request on her chart from 30 to 90 day refills per the patient.   Lisinopril (PRINIVIL,ZESTRIL) 10 MG tablet [  #90   simvastatin (ZOCOR) 40 MG tablet  #90

## 2013-01-23 NOTE — Telephone Encounter (Signed)
[facility]

## 2013-02-28 ENCOUNTER — Telehealth: Payer: Self-pay | Admitting: Internal Medicine

## 2013-02-28 NOTE — Telephone Encounter (Signed)
Called [facility] to check if they have tetanus on record. They did not. Called patient to let her know. Patient stated that was all she needed to know.

## 2013-02-28 NOTE — Telephone Encounter (Signed)
Patient stepped on a nail wants to know when she had her last tetanus shot.

## 2013-03-01 ENCOUNTER — Other Ambulatory Visit: Payer: Self-pay | Admitting: *Deleted

## 2013-03-01 MED ORDER — SERTRALINE HCL 25 MG PO TABS: 25.0000 mg | ORAL_TABLET | Freq: Every day | ORAL | Status: DC

## 2013-03-01 MED ORDER — ESOMEPRAZOLE MAGNESIUM 40 MG PO CPDR: 40.0000 mg | DELAYED_RELEASE_CAPSULE | Freq: Two times a day (BID) | ORAL | Status: DC

## 2013-03-01 NOTE — Telephone Encounter (Signed)
Patient about rx for nexium and zoloft. Needs [facility] day supply [facility] [address] I've sent the nexium in already.

## 2013-04-04 ENCOUNTER — Other Ambulatory Visit (INDEPENDENT_AMBULATORY_CARE_PROVIDER_SITE_OTHER): Payer: Medicare Other

## 2013-04-04 ENCOUNTER — Encounter: Payer: Self-pay | Admitting: *Deleted

## 2013-04-04 DIAGNOSIS — R739 Hyperglycemia, unspecified: Secondary | ICD-10-CM

## 2013-04-04 DIAGNOSIS — R7309 Other abnormal glucose: Secondary | ICD-10-CM

## 2013-04-04 DIAGNOSIS — M810 Age-related osteoporosis without current pathological fracture: Secondary | ICD-10-CM | POA: Diagnosis not present

## 2013-04-04 DIAGNOSIS — E785 Hyperlipidemia, unspecified: Secondary | ICD-10-CM | POA: Diagnosis not present

## 2013-04-04 DIAGNOSIS — I10 Essential (primary) hypertension: Secondary | ICD-10-CM

## 2013-04-04 LAB — BASIC METABOLIC PANEL
BUN: 20 mg/dL (ref 6–23)
CO2: 31 mEq/L (ref 19–32)
Chloride: 104 mEq/L (ref 96–112)
Creatinine, Ser: 1.2 mg/dL (ref 0.4–1.2)

## 2013-04-04 LAB — HEPATIC FUNCTION PANEL
Albumin: 4 g/dL (ref 3.5–5.2)
Alkaline Phosphatase: 63 U/L (ref 39–117)
Bilirubin, Direct: 0 mg/dL (ref 0.0–0.3)

## 2013-04-04 LAB — LIPID PANEL
LDL Cholesterol: 90 mg/dL (ref 0–99)
Total CHOL/HDL Ratio: 3
Triglycerides: 116 mg/dL (ref 0.0–149.0)
VLDL: 23.2 mg/dL (ref 0.0–40.0)

## 2013-04-04 LAB — HEMOGLOBIN A1C: Hgb A1c MFr Bld: 6.2 % (ref 4.6–6.5)

## 2013-04-11 ENCOUNTER — Ambulatory Visit (INDEPENDENT_AMBULATORY_CARE_PROVIDER_SITE_OTHER): Payer: Medicare Other | Admitting: Internal Medicine

## 2013-04-11 ENCOUNTER — Encounter: Payer: Self-pay | Admitting: Internal Medicine

## 2013-04-11 VITALS — BP 124/70 | HR 67 | Temp 97.8°F | Ht 59.75 in | Wt 157.5 lb

## 2013-04-11 DIAGNOSIS — Z23 Encounter for immunization: Secondary | ICD-10-CM

## 2013-04-11 DIAGNOSIS — E785 Hyperlipidemia, unspecified: Secondary | ICD-10-CM | POA: Diagnosis not present

## 2013-04-11 DIAGNOSIS — I10 Essential (primary) hypertension: Secondary | ICD-10-CM | POA: Diagnosis not present

## 2013-04-11 DIAGNOSIS — K219 Gastro-esophageal reflux disease without esophagitis: Secondary | ICD-10-CM

## 2013-04-11 DIAGNOSIS — R739 Hyperglycemia, unspecified: Secondary | ICD-10-CM

## 2013-04-11 DIAGNOSIS — N289 Disorder of kidney and ureter, unspecified: Secondary | ICD-10-CM

## 2013-04-11 DIAGNOSIS — M810 Age-related osteoporosis without current pathological fracture: Secondary | ICD-10-CM | POA: Diagnosis not present

## 2013-04-11 DIAGNOSIS — R7309 Other abnormal glucose: Secondary | ICD-10-CM

## 2013-04-11 LAB — BASIC METABOLIC PANEL
GFR: 49.33 mL/min — ABNORMAL LOW (ref >60.00)
Glucose, Bld: 78 mg/dL (ref 70–99)
Potassium: 4.7 mEq/L (ref 3.5–5.1)
Sodium: 140 mEq/L (ref 135–145)

## 2013-04-11 NOTE — Progress Notes (Signed)
Subjective:    Patient ID: Teresa Wade, female    DOB: 1932-05-11, 79 y.o.   MRN: [id number]  HPI 79 year old female with past history of palpitations, hypercholesterolemia, hypertension and FCD who comes in today for a scheduled follow up.  Is doing better.  Saw cardiology.  Had Holter.  Revealed PACs.  No changes made in her medications.  She denies any significant increased heart rate or palpitations now.  No chest pain or tightness.  No increased sob.  Eating and drinking well.   Overall she feels better.  Feels she is doing well.       Past Medical History  Diagnosis Date  . Hypertension   . Anxiety   . Osteoporosis     s/p Actonel, Reclast (last 2011)  . Emphysema of lung   . Hypercholesterolemia   . GERD (gastroesophageal reflux disease)   . Fibrocystic disease of breast   . History of thrombocytopenia   . Abnormal chest CT     right hilar cyst  . Psoriasis   . Hyperglycemia   . Osteoarthritis     Current Outpatient Prescriptions on File Prior to Visit  Medication Sig Dispense Refill  . acebutolol (SECTRAL) 200 MG capsule Two capsules in the am and one in the pm  270 capsule  1  . calcium carbonate (OS-CAL) 1250 MG chewable tablet Chew 1 tablet by mouth 2 (two) times daily.      Marland Kitchen esomeprazole (NEXIUM) 40 MG capsule Take 1 capsule (40 mg total) by mouth 2 (two) times daily.  180 capsule  1  . glucosamine-chondroitin 500-400 MG tablet       . lisinopril (PRINIVIL,ZESTRIL) 10 MG tablet Take 1 tablet (10 mg total) by mouth daily.  90 tablet  1  . magnesium oxide (MAG-OX) 400 MG tablet Take 400 mg by mouth daily.      . Multiple Vitamin (MULTIVITAMIN) tablet Take 1 tablet by mouth daily.      . Omega-3 Fatty Acids (FISH OIL TRIPLE STRENGTH) 1400 MG CAPS Take 1 tablet by mouth daily.      . sertraline (ZOLOFT) 25 MG tablet Take 1 tablet (25 mg total) by mouth daily.  90 tablet  1  . simvastatin (ZOCOR) 40 MG tablet Take 1 tablet (40 mg total) by mouth every evening.  90  tablet  1   No current facility-administered medications on file prior to visit.    Review of Systems Patient denies any headache, lightheadedness or dizziness.  No significant sinus or allergy symptoms.  No chest pain.  No significant palpitations.  No increased shortness of breath.  No cough or congestion.  No nausea or vomiting.  No significant abdominal pain or cramping.  No acid reflux.  No bowel change, such as diarrhea, constipation, BRBPR or melana.  No urine change.   Did step on a rusty nail previously.  Is due tetanus booster.       Objective:   Physical Exam  Filed Vitals:   04/11/13 0912  BP: 124/70  Pulse: 67  Temp: 97.8 F (45.53 C)   79 year old female in no acute distress.   HEENT:  Nares- clear.  Oropharynx - without lesions. NECK:  Supple.  Nontender.  No audible bruit.  HEART:  Appears to be regular. LUNGS:  No crackles or wheezing audible.  Respirations even and unlabored.  RADIAL PULSE:  Equal bilaterally.   ABDOMEN:  Soft, nontender.  Bowel sounds present and normal.  No audible  abdominal bruit.  EXTREMITIES:  No increased edema present.  DP pulses palpable and equal bilaterally. \ No lesions or evidence of infection - plantar surface of foot.           Assessment & Plan:  GI.  Colonoscopy 02/05/08 - internal hemorrhoids.  Currently  Doing well.  Follow.   HISTORY OF OVARIAN CYST.  Was followed by gyn.  Has had serial ultrasounds.  They have released her and felt no further w/up warranted.  Had CT abdomen/pelvis - negative for acute abnoramlity (03/29/12).    S/P PUNCTURE WOUND.  No evidence of infection.  Tetanus booster given today.    PULMONARY.  Followed by Dr Meredeth Ide.  Breathing stable.  Follow.   INCREASED PSYCHOSOCIAL STRESSORS.  On Zoloft.  Doing well.  Follow.    CARDIOVASCULAR.  Previously had problems with increased palpitations.  Improved now.  Saw cardiology.  Had holter.  PACs.  Doing well. Now.  Follow.      HEALTH MAINTENANCE.  Physical  11/08/12.  Colonoscopy as outlined.  She is s/p hysterectomy and does not require yearly pap smears.  Need to obtain copies of last mammogram.

## 2013-04-15 ENCOUNTER — Other Ambulatory Visit: Payer: Self-pay | Admitting: Internal Medicine

## 2013-04-15 DIAGNOSIS — R739 Hyperglycemia, unspecified: Secondary | ICD-10-CM

## 2013-04-15 DIAGNOSIS — I10 Essential (primary) hypertension: Secondary | ICD-10-CM

## 2013-04-15 DIAGNOSIS — E7800 Pure hypercholesterolemia, unspecified: Secondary | ICD-10-CM

## 2013-04-15 NOTE — Progress Notes (Signed)
Orders placed for f/u labs.  

## 2013-04-16 ENCOUNTER — Encounter: Payer: Self-pay | Admitting: Internal Medicine

## 2013-04-16 NOTE — Assessment & Plan Note (Signed)
Blood pressure under good control.  Same medication regimen.  Follow metabolic panel.  Cr just slightly increased.   Stay hydrated.  Recheck today.

## 2013-04-16 NOTE — Assessment & Plan Note (Signed)
Remains on simvastatin.  Low cholesterol diet and exercise.  Follow lipid panel and liver function.  Recent cholesterol panel wnl.

## 2013-04-16 NOTE — Assessment & Plan Note (Addendum)
Low carb diet and exercise.  Follow met b and a1c.  

## 2013-04-16 NOTE — Assessment & Plan Note (Signed)
Symptoms controlled.  Same medication regimen.  Follow.   

## 2013-04-16 NOTE — Assessment & Plan Note (Addendum)
Calcium, vitamin D and weight bearing exercise.  Follow.  Vitamin D checked 04/04/13 - wnl.

## 2013-05-01 ENCOUNTER — Telehealth: Payer: Self-pay | Admitting: Internal Medicine

## 2013-05-01 DIAGNOSIS — Z1239 Encounter for other screening for malignant neoplasm of breast: Secondary | ICD-10-CM

## 2013-05-01 NOTE — Telephone Encounter (Signed)
Needing order for Mammogram. Patient aware of appointment at [facility] on 6.25.14 @ 9:40.

## 2013-05-02 NOTE — Telephone Encounter (Signed)
Order placed for mammogram.  See message.

## 2013-06-06 DIAGNOSIS — L819 Disorder of pigmentation, unspecified: Secondary | ICD-10-CM | POA: Diagnosis not present

## 2013-06-06 DIAGNOSIS — L940 Localized scleroderma [morphea]: Secondary | ICD-10-CM | POA: Diagnosis not present

## 2013-06-06 DIAGNOSIS — Z1283 Encounter for screening for malignant neoplasm of skin: Secondary | ICD-10-CM | POA: Diagnosis not present

## 2013-06-07 ENCOUNTER — Ambulatory Visit: Payer: Self-pay | Admitting: Internal Medicine

## 2013-06-07 DIAGNOSIS — I10 Essential (primary) hypertension: Secondary | ICD-10-CM | POA: Diagnosis not present

## 2013-06-07 DIAGNOSIS — E781 Pure hyperglyceridemia: Secondary | ICD-10-CM | POA: Diagnosis not present

## 2013-06-07 DIAGNOSIS — I491 Atrial premature depolarization: Secondary | ICD-10-CM | POA: Diagnosis not present

## 2013-06-07 DIAGNOSIS — Z1231 Encounter for screening mammogram for malignant neoplasm of breast: Secondary | ICD-10-CM | POA: Diagnosis not present

## 2013-06-08 ENCOUNTER — Encounter: Payer: Self-pay | Admitting: *Deleted

## 2013-06-21 ENCOUNTER — Encounter: Payer: Self-pay | Admitting: Internal Medicine

## 2013-06-29 ENCOUNTER — Other Ambulatory Visit: Payer: Self-pay | Admitting: *Deleted

## 2013-06-29 MED ORDER — ACEBUTOLOL HCL 200 MG PO CAPS: ORAL_CAPSULE | ORAL | Status: DC

## 2013-07-19 ENCOUNTER — Other Ambulatory Visit: Payer: Self-pay | Admitting: Internal Medicine

## 2013-08-08 ENCOUNTER — Other Ambulatory Visit (INDEPENDENT_AMBULATORY_CARE_PROVIDER_SITE_OTHER): Payer: Medicare Other

## 2013-08-08 DIAGNOSIS — R739 Hyperglycemia, unspecified: Secondary | ICD-10-CM

## 2013-08-08 DIAGNOSIS — R7309 Other abnormal glucose: Secondary | ICD-10-CM

## 2013-08-08 DIAGNOSIS — E7800 Pure hypercholesterolemia, unspecified: Secondary | ICD-10-CM

## 2013-08-08 DIAGNOSIS — I10 Essential (primary) hypertension: Secondary | ICD-10-CM | POA: Diagnosis not present

## 2013-08-08 LAB — URINALYSIS, ROUTINE W REFLEX MICROSCOPIC
Bilirubin Urine: NEGATIVE
Hgb urine dipstick: NEGATIVE
Ketones, ur: NEGATIVE
Nitrite: NEGATIVE
Total Protein, Urine: NEGATIVE
pH: 6.5 (ref 5.0–8.0)

## 2013-08-08 LAB — LIPID PANEL
Cholesterol: 159 mg/dL (ref 0–200)
HDL: 51.6 mg/dL (ref >39.00)
Triglycerides: 110 mg/dL (ref 0.0–149.0)
VLDL: 22 mg/dL (ref 0.0–40.0)

## 2013-08-08 LAB — HEPATIC FUNCTION PANEL
ALT: 18 U/L (ref 0–35)
AST: 25 U/L (ref 0–37)
Total Bilirubin: 0.9 mg/dL (ref 0.3–1.2)
Total Protein: 7.1 g/dL (ref 6.0–8.3)

## 2013-08-08 LAB — MICROALBUMIN / CREATININE URINE RATIO
Creatinine,U: 237.3 mg/dL
Microalb Creat Ratio: 0.5 mg/g (ref 0.0–30.0)
Microalb, Ur: 1.3 mg/dL (ref 0.0–1.9)

## 2013-08-08 LAB — BASIC METABOLIC PANEL
Calcium: 9.2 mg/dL (ref 8.4–10.5)
GFR: 50.32 mL/min — ABNORMAL LOW (ref >60.00)
Potassium: 4.9 mEq/L (ref 3.5–5.1)
Sodium: 137 mEq/L (ref 135–145)

## 2013-08-08 LAB — HEMOGLOBIN A1C: Hgb A1c MFr Bld: 6.5 % (ref 4.6–6.5)

## 2013-08-15 ENCOUNTER — Ambulatory Visit (INDEPENDENT_AMBULATORY_CARE_PROVIDER_SITE_OTHER): Payer: Medicare Other | Admitting: Internal Medicine

## 2013-08-15 ENCOUNTER — Encounter: Payer: Self-pay | Admitting: Internal Medicine

## 2013-08-15 VITALS — BP 120/70 | HR 61 | Temp 97.8°F | Ht 59.75 in | Wt 161.8 lb

## 2013-08-15 DIAGNOSIS — E785 Hyperlipidemia, unspecified: Secondary | ICD-10-CM

## 2013-08-15 DIAGNOSIS — E119 Type 2 diabetes mellitus without complications: Secondary | ICD-10-CM | POA: Diagnosis not present

## 2013-08-15 DIAGNOSIS — M810 Age-related osteoporosis without current pathological fracture: Secondary | ICD-10-CM

## 2013-08-15 DIAGNOSIS — I10 Essential (primary) hypertension: Secondary | ICD-10-CM

## 2013-08-15 DIAGNOSIS — K219 Gastro-esophageal reflux disease without esophagitis: Secondary | ICD-10-CM

## 2013-08-18 ENCOUNTER — Encounter: Payer: Self-pay | Admitting: Internal Medicine

## 2013-08-18 NOTE — Assessment & Plan Note (Signed)
Low carb diet and exercise.  Follow met b and a1c.  

## 2013-08-18 NOTE — Assessment & Plan Note (Signed)
Calcium, vitamin D and weight bearing exercise.  Follow.  Vitamin D checked 04/04/13 - wnl.

## 2013-08-18 NOTE — Assessment & Plan Note (Signed)
Remains on simvastatin.  Low cholesterol diet and exercise.  Follow lipid panel and liver function.  Recent cholesterol panel wnl.

## 2013-08-18 NOTE — Assessment & Plan Note (Signed)
Symptoms controlled.  Same medication regimen.  Follow.   

## 2013-08-18 NOTE — Progress Notes (Signed)
Subjective:    Patient ID: Teresa Wade, female    DOB: Nov 10, 1932, 79 y.o.   MRN: [id number]  HPI 79 year old female with past history of palpitations, hypercholesterolemia, hypertension and FCD who comes in today for a scheduled follow up.  Is doing better.  Saw cardiology.  Had Holter.  Revealed PACs.  No changes made in her medications.  She denies any significant increased heart rate or palpitations.  No chest pain or tightness.  No increased sob.  Eating and drinking well.   Overall she feels better.  Feels she is doing well.  Seeing Dr Cheree Ditto for her skin lesions.      Past Medical History  Diagnosis Date  . Hypertension   . Anxiety   . Osteoporosis     s/p Actonel, Reclast (last 2011)  . Emphysema of lung   . Hypercholesterolemia   . GERD (gastroesophageal reflux disease)   . Fibrocystic disease of breast   . History of thrombocytopenia   . Abnormal chest CT     right hilar cyst  . Psoriasis   . Hyperglycemia   . Osteoarthritis     Current Outpatient Prescriptions on File Prior to Visit  Medication Sig Dispense Refill  . acebutolol (SECTRAL) 200 MG capsule Two capsules in the am and one in the pm  270 capsule  1  . calcium carbonate (OS-CAL) 1250 MG chewable tablet Chew 1 tablet by mouth 2 (two) times daily.      Marland Kitchen esomeprazole (NEXIUM) 40 MG capsule Take 1 capsule (40 mg total) by mouth 2 (two) times daily.  180 capsule  1  . lisinopril (PRINIVIL,ZESTRIL) 10 MG tablet TAKE 1 TABLET (10 MG TOTAL) BY MOUTH DAILY.  90 tablet  1  . magnesium oxide (MAG-OX) 400 MG tablet Take 400 mg by mouth daily.      . Multiple Vitamin (MULTIVITAMIN) tablet Take 1 tablet by mouth daily.      . Omega-3 Fatty Acids (FISH OIL TRIPLE STRENGTH) 1400 MG CAPS Take 1 tablet by mouth daily.      . sertraline (ZOLOFT) 25 MG tablet Take 1 tablet (25 mg total) by mouth daily.  90 tablet  1  . simvastatin (ZOCOR) 40 MG tablet TAKE 1 TABLET (40 MG TOTAL) BY MOUTH EVERY EVENING.  90 tablet  1   No  current facility-administered medications on file prior to visit.    Review of Systems Patient denies any headache, lightheadedness or dizziness.  No significant sinus or allergy symptoms.  No chest pain.  No significant palpitations.  No increased shortness of breath.  No cough or congestion.  No nausea or vomiting.  No significant abdominal pain or cramping.  No acid reflux.  No bowel change, such as diarrhea, constipation, BRBPR or melana.  No urine change.       Objective:   Physical Exam  Filed Vitals:   08/15/13 0755  BP: 120/70  Pulse: 61  Temp: 97.8 F (11.59 C)   79 year old female in no acute distress.   HEENT:  Nares- clear.  Oropharynx - without lesions. NECK:  Supple.  Nontender.  No audible bruit.  HEART:  Appears to be regular. LUNGS:  No crackles or wheezing audible.  Respirations even and unlabored.  RADIAL PULSE:  Equal bilaterally.   ABDOMEN:  Soft, nontender.  Bowel sounds present and normal.  No audible abdominal bruit.  EXTREMITIES:  No increased edema present.  DP pulses palpable and equal bilaterally.  Assessment & Plan:  GI.  Colonoscopy 02/05/08 - internal hemorrhoids.  Currently  Doing well.  Follow.   HISTORY OF OVARIAN CYST.  Was followed by gyn.  Has had serial ultrasounds.  They have released her and felt no further w/up warranted.  Had CT abdomen/pelvis - negative for acute abnoramlity (03/29/12).    PULMONARY.  Followed by Dr Meredeth Ide.  Breathing stable.  Follow.   INCREASED PSYCHOSOCIAL STRESSORS.  On Zoloft.  Doing well.  Follow.    CARDIOVASCULAR.  Previously had problems with increased palpitations.  Improved now.  Saw cardiology.  Had holter.  PACs.  Doing well. Now.  Follow.      HEALTH MAINTENANCE.  Physical 11/08/12.  Colonoscopy as outlined.  She is s/p hysterectomy and does not require yearly pap smears.  Mammogram 06/07/13 - Birads II.

## 2013-08-18 NOTE — Assessment & Plan Note (Signed)
Blood pressure under good control.  Same medication regimen.  Follow metabolic panel.   

## 2013-08-22 DIAGNOSIS — Z23 Encounter for immunization: Secondary | ICD-10-CM | POA: Diagnosis not present

## 2013-08-24 ENCOUNTER — Other Ambulatory Visit: Payer: Self-pay | Admitting: *Deleted

## 2013-08-24 MED ORDER — ESOMEPRAZOLE MAGNESIUM 40 MG PO CPDR: 40.0000 mg | DELAYED_RELEASE_CAPSULE | Freq: Two times a day (BID) | ORAL | Status: DC

## 2013-08-27 ENCOUNTER — Other Ambulatory Visit: Payer: Self-pay | Admitting: *Deleted

## 2013-08-27 MED ORDER — SERTRALINE HCL 25 MG PO TABS: 25.0000 mg | ORAL_TABLET | Freq: Every day | ORAL | Status: DC

## 2013-09-03 ENCOUNTER — Ambulatory Visit: Payer: Self-pay | Admitting: Internal Medicine

## 2013-09-03 DIAGNOSIS — Z7189 Other specified counseling: Secondary | ICD-10-CM | POA: Diagnosis not present

## 2013-09-03 DIAGNOSIS — E119 Type 2 diabetes mellitus without complications: Secondary | ICD-10-CM | POA: Diagnosis not present

## 2013-09-29 ENCOUNTER — Ambulatory Visit: Payer: Self-pay | Admitting: Internal Medicine

## 2013-09-29 DIAGNOSIS — E119 Type 2 diabetes mellitus without complications: Secondary | ICD-10-CM | POA: Diagnosis not present

## 2013-09-29 DIAGNOSIS — Z7189 Other specified counseling: Secondary | ICD-10-CM | POA: Diagnosis not present

## 2013-10-03 DIAGNOSIS — E119 Type 2 diabetes mellitus without complications: Secondary | ICD-10-CM | POA: Diagnosis not present

## 2013-10-29 ENCOUNTER — Ambulatory Visit: Payer: Self-pay | Admitting: Internal Medicine

## 2013-10-29 DIAGNOSIS — E119 Type 2 diabetes mellitus without complications: Secondary | ICD-10-CM | POA: Diagnosis not present

## 2013-10-29 DIAGNOSIS — Z7189 Other specified counseling: Secondary | ICD-10-CM | POA: Diagnosis not present

## 2013-11-05 DIAGNOSIS — L940 Localized scleroderma [morphea]: Secondary | ICD-10-CM | POA: Diagnosis not present

## 2013-11-05 DIAGNOSIS — Z419 Encounter for procedure for purposes other than remedying health state, unspecified: Secondary | ICD-10-CM | POA: Diagnosis not present

## 2013-11-06 ENCOUNTER — Telehealth: Payer: Self-pay | Admitting: *Deleted

## 2013-11-06 NOTE — Telephone Encounter (Signed)
Pharmacy note:  Clarithromycin  Simvastatin   INTERACTION: Cannot be given together per dermatologist, PCP is going to change cholesterol med. No Statins

## 2013-11-14 ENCOUNTER — Other Ambulatory Visit (INDEPENDENT_AMBULATORY_CARE_PROVIDER_SITE_OTHER): Payer: Medicare Other

## 2013-11-14 DIAGNOSIS — E119 Type 2 diabetes mellitus without complications: Secondary | ICD-10-CM | POA: Diagnosis not present

## 2013-11-14 DIAGNOSIS — E785 Hyperlipidemia, unspecified: Secondary | ICD-10-CM | POA: Diagnosis not present

## 2013-11-14 DIAGNOSIS — I10 Essential (primary) hypertension: Secondary | ICD-10-CM | POA: Diagnosis not present

## 2013-11-14 LAB — HEPATIC FUNCTION PANEL
ALT: 19 U/L (ref 0–35)
Albumin: 4.1 g/dL (ref 3.5–5.2)
Bilirubin, Direct: 0.1 mg/dL (ref 0.0–0.3)
Total Protein: 6.4 g/dL (ref 6.0–8.3)

## 2013-11-14 LAB — CBC WITH DIFFERENTIAL/PLATELET
Basophils Relative: 0.7 % (ref 0.0–3.0)
Eosinophils Absolute: 0.1 10*3/uL (ref 0.0–0.7)
Eosinophils Relative: 1.1 % (ref 0.0–5.0)
Hemoglobin: 12.6 g/dL (ref 12.0–15.0)
Lymphocytes Relative: 31.5 % (ref 12.0–46.0)
MCHC: 34.4 g/dL (ref 30.0–36.0)
MCV: 87.2 fl (ref 78.0–100.0)
Neutro Abs: 2.6 10*3/uL (ref 1.4–7.7)
Neutrophils Relative %: 55.4 % (ref 43.0–77.0)
RBC: 4.2 Mil/uL (ref 3.87–5.11)
WBC: 4.7 10*3/uL (ref 4.5–10.5)

## 2013-11-14 LAB — BASIC METABOLIC PANEL
GFR: 58.06 mL/min — ABNORMAL LOW (ref >60.00)
Potassium: 4.9 mEq/L (ref 3.5–5.1)
Sodium: 139 mEq/L (ref 135–145)

## 2013-11-14 LAB — LIPID PANEL
Cholesterol: 163 mg/dL (ref 0–200)
HDL: 49.6 mg/dL (ref >39.00)
Triglycerides: 133 mg/dL (ref 0.0–149.0)
VLDL: 26.6 mg/dL (ref 0.0–40.0)

## 2013-11-16 ENCOUNTER — Encounter: Payer: Self-pay | Admitting: Internal Medicine

## 2013-11-16 ENCOUNTER — Ambulatory Visit (INDEPENDENT_AMBULATORY_CARE_PROVIDER_SITE_OTHER): Payer: Medicare Other | Admitting: Internal Medicine

## 2013-11-16 VITALS — BP 124/70 | HR 65 | Temp 97.9°F | Ht 58.5 in | Wt 160.5 lb

## 2013-11-16 DIAGNOSIS — E7800 Pure hypercholesterolemia, unspecified: Secondary | ICD-10-CM

## 2013-11-16 DIAGNOSIS — K219 Gastro-esophageal reflux disease without esophagitis: Secondary | ICD-10-CM | POA: Diagnosis not present

## 2013-11-16 DIAGNOSIS — E119 Type 2 diabetes mellitus without complications: Secondary | ICD-10-CM

## 2013-11-16 DIAGNOSIS — I10 Essential (primary) hypertension: Secondary | ICD-10-CM | POA: Diagnosis not present

## 2013-11-16 DIAGNOSIS — E785 Hyperlipidemia, unspecified: Secondary | ICD-10-CM

## 2013-11-16 DIAGNOSIS — M810 Age-related osteoporosis without current pathological fracture: Secondary | ICD-10-CM

## 2013-11-16 MED ORDER — PRAVASTATIN SODIUM 40 MG PO TABS: 40.0000 mg | ORAL_TABLET | Freq: Every day | ORAL | Status: DC

## 2013-11-16 NOTE — Progress Notes (Signed)
Subjective:    Patient ID: Teresa Wade, female    DOB: [birth year], 78 y.o.   MRN: [id number]  HPI 79 year old female with past history of palpitations, hypercholesterolemia, hypertension and FCD who comes in today to follow up on these issues as well as for a complete physical exam.   Is doing better.  Saw cardiology.  Had Holter.  Revealed PACs.  No changes made in her medications.  She denies any significant increased heart rate or palpitations.  No chest pain or tightness.  No increased sob.  Eating and drinking well.   Overall she feels she is doing well.  Seeing Dr Cheree Ditto for her skin lesions.  Wants to start biaxin.  Need to change simvastatin.       Past Medical History  Diagnosis Date  . Hypertension   . Anxiety   . Osteoporosis     s/p Actonel, Reclast (last 2011)  . Emphysema of lung   . Hypercholesterolemia   . GERD (gastroesophageal reflux disease)   . Fibrocystic disease of breast   . History of thrombocytopenia   . Abnormal chest CT     right hilar cyst  . Psoriasis   . Hyperglycemia   . Osteoarthritis     Current Outpatient Prescriptions on File Prior to Visit  Medication Sig Dispense Refill  . acebutolol (SECTRAL) 200 MG capsule Two capsules in the am and one in the pm  270 capsule  1  . calcium carbonate (OS-CAL) 1250 MG chewable tablet Chew 1 tablet by mouth 2 (two) times daily.      Marland Kitchen esomeprazole (NEXIUM) 40 MG capsule Take 1 capsule (40 mg total) by mouth 2 (two) times daily.  180 capsule  1  . lisinopril (PRINIVIL,ZESTRIL) 10 MG tablet TAKE 1 TABLET (10 MG TOTAL) BY MOUTH DAILY.  90 tablet  1  . magnesium oxide (MAG-OX) 400 MG tablet Take 400 mg by mouth daily.      . Multiple Vitamin (MULTIVITAMIN) tablet Take 1 tablet by mouth daily.      . Omega-3 Fatty Acids (FISH OIL TRIPLE STRENGTH) 1400 MG CAPS Take 1 tablet by mouth daily.      . sertraline (ZOLOFT) 25 MG tablet Take 1 tablet (25 mg total) by mouth daily.  90 tablet  0  . simvastatin (ZOCOR) 40  MG tablet TAKE 1 TABLET (40 MG TOTAL) BY MOUTH EVERY EVENING.  90 tablet  1   No current facility-administered medications on file prior to visit.    Review of Systems Patient denies any headache, lightheadedness or dizziness.  No significant sinus or allergy symptoms.  No chest pain.  No significant palpitations.  No increased shortness of breath.  No cough or congestion.  No nausea or vomiting.  No acid reflux.   No significant abdominal pain or cramping.  No bowel change, such as diarrhea, constipation, BRBPR or melana.  No urine change.  Overall she feels good.       Objective:   Physical Exam  Filed Vitals:   11/16/13 1328  BP: 124/70  Pulse: 65  Temp: 97.9 F (87.61 C)   79 year old female in no acute distress.   HEENT:  Nares- clear.  Oropharynx - without lesions. NECK:  Supple.  Nontender.  No audible bruit.  HEART:  Appears to be regular. LUNGS:  No crackles or wheezing audible.  Respirations even and unlabored.  RADIAL PULSE:  Equal bilaterally.    BREASTS:  No nipple discharge or nipple  retraction present.  Could not appreciate any distinct nodules or axillary adenopathy.  ABDOMEN:  Soft, nontender.  Bowel sounds present and normal.  No audible abdominal bruit.  GU:  Not performed.     EXTREMITIES:  No increased edema present.  DP pulses palpable and equal bilaterally.          Assessment & Plan:  GI.  Colonoscopy 02/05/08 - internal hemorrhoids.  States she had a colonoscopy in the last 1-2 years.  Need results.   Doing well.  Follow.   HISTORY OF OVARIAN CYST.  Was followed by gyn.  Has had serial ultrasounds.  They have released her and felt no further w/up warranted.  Had CT abdomen/pelvis - negative for acute abnoramlity (03/29/12).    PULMONARY.  Followed by Dr Meredeth Ide.  Breathing stable.  Follow.   INCREASED PSYCHOSOCIAL STRESSORS.  On Zoloft.  Doing well.  Follow.    CARDIOVASCULAR.  Previously had problems with increased palpitations.  Improved now.  Saw  cardiology.  Had holter.  PACs.  Doing well. Now.  Follow.      HEALTH MAINTENANCE.  Physical today.  Colonoscopy as outlined.  She is s/p hysterectomy and does not require yearly pap smears.  Mammogram 06/07/13 - Birads II.

## 2013-11-16 NOTE — Progress Notes (Signed)
Pre-visit discussion using our clinic review tool. No additional management support is needed unless otherwise documented below in the visit note.  

## 2013-11-18 ENCOUNTER — Encounter: Payer: Self-pay | Admitting: Internal Medicine

## 2013-11-18 NOTE — Assessment & Plan Note (Signed)
Continue vitamin D and weight bearing exercise.  Follow.  Vitamin D checked 04/04/13 - wnl.    

## 2013-11-18 NOTE — Assessment & Plan Note (Signed)
Low carb diet and exercise.  Follow met b and a1c.  

## 2013-11-18 NOTE — Assessment & Plan Note (Signed)
Symptoms controlled.  Same medication regimen.  Follow.   

## 2013-11-18 NOTE — Assessment & Plan Note (Signed)
Blood pressure under good control.  Same medication regimen.  Follow metabolic panel.   

## 2013-11-18 NOTE — Assessment & Plan Note (Signed)
Low cholesterol diet and exercise.  Follow lipid panel and liver function.  Recent cholesterol panel wnl.  Will change to pravastatin since planning to start biaxin.  Follow liver panel.

## 2013-11-27 DIAGNOSIS — M19049 Primary osteoarthritis, unspecified hand: Secondary | ICD-10-CM | POA: Diagnosis not present

## 2013-11-29 ENCOUNTER — Ambulatory Visit: Payer: Self-pay | Admitting: Internal Medicine

## 2013-11-29 HISTORY — PX: THUMB AMPUTATION: SHX804

## 2013-12-02 DIAGNOSIS — Z86010 Personal history of colonic polyps: Secondary | ICD-10-CM | POA: Insufficient documentation

## 2013-12-03 DIAGNOSIS — R079 Chest pain, unspecified: Secondary | ICD-10-CM | POA: Diagnosis not present

## 2013-12-03 DIAGNOSIS — I10 Essential (primary) hypertension: Secondary | ICD-10-CM | POA: Diagnosis not present

## 2013-12-04 ENCOUNTER — Ambulatory Visit: Payer: Self-pay | Admitting: Specialist

## 2013-12-11 ENCOUNTER — Ambulatory Visit: Payer: Self-pay | Admitting: Specialist

## 2013-12-11 DIAGNOSIS — I10 Essential (primary) hypertension: Secondary | ICD-10-CM | POA: Diagnosis not present

## 2013-12-11 DIAGNOSIS — Z7982 Long term (current) use of aspirin: Secondary | ICD-10-CM | POA: Diagnosis not present

## 2013-12-11 DIAGNOSIS — R002 Palpitations: Secondary | ICD-10-CM | POA: Diagnosis not present

## 2013-12-11 DIAGNOSIS — Z87891 Personal history of nicotine dependence: Secondary | ICD-10-CM | POA: Diagnosis not present

## 2013-12-11 DIAGNOSIS — M19049 Primary osteoarthritis, unspecified hand: Secondary | ICD-10-CM | POA: Diagnosis not present

## 2013-12-11 DIAGNOSIS — K219 Gastro-esophageal reflux disease without esophagitis: Secondary | ICD-10-CM | POA: Diagnosis not present

## 2013-12-11 DIAGNOSIS — Z79899 Other long term (current) drug therapy: Secondary | ICD-10-CM | POA: Diagnosis not present

## 2013-12-11 DIAGNOSIS — Z881 Allergy status to other antibiotic agents status: Secondary | ICD-10-CM | POA: Diagnosis not present

## 2013-12-11 DIAGNOSIS — Z888 Allergy status to other drugs, medicaments and biological substances status: Secondary | ICD-10-CM | POA: Diagnosis not present

## 2013-12-11 DIAGNOSIS — E7800 Pure hypercholesterolemia, unspecified: Secondary | ICD-10-CM | POA: Diagnosis not present

## 2013-12-17 DIAGNOSIS — M19049 Primary osteoarthritis, unspecified hand: Secondary | ICD-10-CM | POA: Diagnosis not present

## 2013-12-17 DIAGNOSIS — Z4889 Encounter for other specified surgical aftercare: Secondary | ICD-10-CM | POA: Diagnosis not present

## 2013-12-26 ENCOUNTER — Other Ambulatory Visit: Payer: Self-pay | Admitting: Internal Medicine

## 2013-12-28 ENCOUNTER — Other Ambulatory Visit: Payer: Medicare Other

## 2013-12-31 ENCOUNTER — Other Ambulatory Visit (INDEPENDENT_AMBULATORY_CARE_PROVIDER_SITE_OTHER): Payer: Medicare Other

## 2013-12-31 DIAGNOSIS — M19049 Primary osteoarthritis, unspecified hand: Secondary | ICD-10-CM | POA: Diagnosis not present

## 2013-12-31 DIAGNOSIS — E7800 Pure hypercholesterolemia, unspecified: Secondary | ICD-10-CM

## 2013-12-31 LAB — HEPATIC FUNCTION PANEL
ALBUMIN: 3.9 g/dL (ref 3.5–5.2)
ALK PHOS: 70 U/L (ref 39–117)
ALT: 17 U/L (ref 0–35)
AST: 23 U/L (ref 0–37)
BILIRUBIN DIRECT: 0 mg/dL (ref 0.0–0.3)
Total Bilirubin: 0.5 mg/dL (ref 0.3–1.2)
Total Protein: 6.6 g/dL (ref 6.0–8.3)

## 2014-01-01 ENCOUNTER — Other Ambulatory Visit: Payer: Self-pay | Admitting: Internal Medicine

## 2014-01-01 DIAGNOSIS — Z79899 Other long term (current) drug therapy: Secondary | ICD-10-CM

## 2014-01-01 NOTE — Progress Notes (Signed)
Order placed for f/u lab.   

## 2014-01-02 ENCOUNTER — Encounter: Payer: Self-pay | Admitting: *Deleted

## 2014-01-14 ENCOUNTER — Other Ambulatory Visit: Payer: Self-pay | Admitting: Internal Medicine

## 2014-01-30 ENCOUNTER — Other Ambulatory Visit (INDEPENDENT_AMBULATORY_CARE_PROVIDER_SITE_OTHER): Payer: Medicare Other

## 2014-01-30 ENCOUNTER — Other Ambulatory Visit: Payer: Self-pay | Admitting: Internal Medicine

## 2014-01-30 DIAGNOSIS — I10 Essential (primary) hypertension: Secondary | ICD-10-CM

## 2014-01-30 DIAGNOSIS — M810 Age-related osteoporosis without current pathological fracture: Secondary | ICD-10-CM

## 2014-01-30 DIAGNOSIS — Z79899 Other long term (current) drug therapy: Secondary | ICD-10-CM | POA: Diagnosis not present

## 2014-01-30 DIAGNOSIS — K219 Gastro-esophageal reflux disease without esophagitis: Secondary | ICD-10-CM

## 2014-01-30 DIAGNOSIS — E119 Type 2 diabetes mellitus without complications: Secondary | ICD-10-CM

## 2014-01-30 DIAGNOSIS — E785 Hyperlipidemia, unspecified: Secondary | ICD-10-CM

## 2014-01-30 LAB — HEPATIC FUNCTION PANEL
ALBUMIN: 3.7 g/dL (ref 3.5–5.2)
ALK PHOS: 67 U/L (ref 39–117)
ALT: 22 U/L (ref 0–35)
AST: 25 U/L (ref 0–37)
Bilirubin, Direct: 0 mg/dL (ref 0.0–0.3)
Total Bilirubin: 0.6 mg/dL (ref 0.3–1.2)
Total Protein: 6.4 g/dL (ref 6.0–8.3)

## 2014-01-30 NOTE — Progress Notes (Signed)
Orders placed for labs

## 2014-01-31 ENCOUNTER — Encounter: Payer: Self-pay | Admitting: *Deleted

## 2014-02-20 ENCOUNTER — Other Ambulatory Visit: Payer: Self-pay | Admitting: Internal Medicine

## 2014-03-12 ENCOUNTER — Other Ambulatory Visit (INDEPENDENT_AMBULATORY_CARE_PROVIDER_SITE_OTHER): Payer: Medicare Other

## 2014-03-12 DIAGNOSIS — E119 Type 2 diabetes mellitus without complications: Secondary | ICD-10-CM

## 2014-03-12 DIAGNOSIS — I10 Essential (primary) hypertension: Secondary | ICD-10-CM | POA: Diagnosis not present

## 2014-03-12 DIAGNOSIS — E785 Hyperlipidemia, unspecified: Secondary | ICD-10-CM | POA: Diagnosis not present

## 2014-03-12 LAB — LIPID PANEL
CHOLESTEROL: 171 mg/dL (ref 0–200)
HDL: 48.9 mg/dL (ref >39.00)
LDL Cholesterol: 89 mg/dL (ref 0–99)
Total CHOL/HDL Ratio: 3
Triglycerides: 165 mg/dL — ABNORMAL HIGH (ref 0.0–149.0)
VLDL: 33 mg/dL (ref 0.0–40.0)

## 2014-03-12 LAB — BASIC METABOLIC PANEL
BUN: 21 mg/dL (ref 6–23)
CHLORIDE: 103 meq/L (ref 96–112)
CO2: 29 mEq/L (ref 19–32)
CREATININE: 1 mg/dL (ref 0.4–1.2)
Calcium: 9.3 mg/dL (ref 8.4–10.5)
GFR: 58.7 mL/min — ABNORMAL LOW (ref >60.00)
Glucose, Bld: 98 mg/dL (ref 70–99)
Potassium: 4.4 mEq/L (ref 3.5–5.1)
Sodium: 138 mEq/L (ref 135–145)

## 2014-03-12 LAB — MICROALBUMIN / CREATININE URINE RATIO
Creatinine,U: 77.4 mg/dL
Microalb Creat Ratio: 0.3 mg/g (ref 0.0–30.0)
Microalb, Ur: 0.2 mg/dL (ref 0.0–1.9)

## 2014-03-12 LAB — HEMOGLOBIN A1C: Hgb A1c MFr Bld: 6.3 % (ref 4.6–6.5)

## 2014-03-12 LAB — HEPATIC FUNCTION PANEL
ALK PHOS: 59 U/L (ref 39–117)
ALT: 21 U/L (ref 0–35)
AST: 27 U/L (ref 0–37)
Albumin: 3.9 g/dL (ref 3.5–5.2)
Bilirubin, Direct: 0 mg/dL (ref 0.0–0.3)
TOTAL PROTEIN: 6.8 g/dL (ref 6.0–8.3)
Total Bilirubin: 0.7 mg/dL (ref 0.3–1.2)

## 2014-03-19 ENCOUNTER — Ambulatory Visit (INDEPENDENT_AMBULATORY_CARE_PROVIDER_SITE_OTHER): Payer: Medicare Other | Admitting: Internal Medicine

## 2014-03-19 ENCOUNTER — Encounter: Payer: Self-pay | Admitting: Internal Medicine

## 2014-03-19 VITALS — BP 130/82 | HR 65 | Temp 98.1°F | Resp 14 | Wt 161.5 lb

## 2014-03-19 DIAGNOSIS — K219 Gastro-esophageal reflux disease without esophagitis: Secondary | ICD-10-CM | POA: Diagnosis not present

## 2014-03-19 DIAGNOSIS — R7309 Other abnormal glucose: Secondary | ICD-10-CM

## 2014-03-19 DIAGNOSIS — Z86010 Personal history of colonic polyps: Secondary | ICD-10-CM

## 2014-03-19 DIAGNOSIS — Z23 Encounter for immunization: Secondary | ICD-10-CM

## 2014-03-19 DIAGNOSIS — E785 Hyperlipidemia, unspecified: Secondary | ICD-10-CM | POA: Diagnosis not present

## 2014-03-19 DIAGNOSIS — R739 Hyperglycemia, unspecified: Secondary | ICD-10-CM

## 2014-03-19 DIAGNOSIS — E119 Type 2 diabetes mellitus without complications: Secondary | ICD-10-CM

## 2014-03-19 DIAGNOSIS — I10 Essential (primary) hypertension: Secondary | ICD-10-CM

## 2014-03-19 DIAGNOSIS — M810 Age-related osteoporosis without current pathological fracture: Secondary | ICD-10-CM

## 2014-03-19 MED ORDER — SIMVASTATIN 40 MG PO TABS: 40.0000 mg | ORAL_TABLET | Freq: Every day | ORAL | Status: DC

## 2014-03-19 MED ORDER — SERTRALINE HCL 25 MG PO TABS: 25.0000 mg | ORAL_TABLET | Freq: Every day | ORAL | Status: DC

## 2014-03-19 NOTE — Progress Notes (Signed)
Pre visit review using our clinic review tool, if applicable. No additional management support is needed unless otherwise documented below in the visit note. 

## 2014-03-23 ENCOUNTER — Encounter: Payer: Self-pay | Admitting: Internal Medicine

## 2014-03-23 DIAGNOSIS — R739 Hyperglycemia, unspecified: Secondary | ICD-10-CM | POA: Insufficient documentation

## 2014-03-23 NOTE — Assessment & Plan Note (Signed)
Symptoms controlled.  Same medication regimen.  Follow.   

## 2014-03-23 NOTE — Assessment & Plan Note (Signed)
She is exercising.  a1c just checked - 6.3.  Low carb diet and exercise.  Follow.

## 2014-03-23 NOTE — Assessment & Plan Note (Signed)
Continue vitamin D and weight bearing exercise.  Follow.  Vitamin D checked 04/04/13 - wnl.

## 2014-03-23 NOTE — Assessment & Plan Note (Signed)
Colonoscopy as outlined.    

## 2014-03-23 NOTE — Assessment & Plan Note (Signed)
Low cholesterol diet and exercise.  Follow lipid panel and liver function.  Recent cholesterol panel with LDL 89 and triglycerides 165.  She is back on her simvastatin.  Follow.

## 2014-03-23 NOTE — Assessment & Plan Note (Signed)
Blood pressure under good control.  Same medication regimen.  Follow metabolic panel.   

## 2014-03-23 NOTE — Assessment & Plan Note (Signed)
Low carb diet and exercise.  Follow met b and a1c.    

## 2014-03-23 NOTE — Progress Notes (Signed)
Subjective:    Patient ID: Teresa Wade, female    DOB: Mar 20, 1932, 80 y.o.   MRN: [id number]  HPI 80 year old female with past history of palpitations, hypercholesterolemia, hypertension and FCD who comes in today for a scheduled follow up.   Is doing better.  Saw cardiology.  Had Holter.  Revealed PACs.  No changes made in her medications.  She denies any significant increased heart rate or palpitations.  No chest pain or tightness.  No increased sob.  Eating and drinking well.   Overall she feels she is doing well.  Seeing Dr Phillip Heal for her skin lesions.  She is walking 2-3x/week.  Overall feels good.        Past Medical History  Diagnosis Date  . Hypertension   . Anxiety   . Osteoporosis     s/p Actonel, Reclast (last 2011)  . Emphysema of lung   . Hypercholesterolemia   . GERD (gastroesophageal reflux disease)   . Fibrocystic disease of breast   . History of thrombocytopenia   . Abnormal chest CT     right hilar cyst  . Psoriasis   . Hyperglycemia   . Osteoarthritis     Current Outpatient Prescriptions on File Prior to Visit  Medication Sig Dispense Refill  . acebutolol (SECTRAL) 200 MG capsule TAKE 2 CAPSULES BY MOUTH EVERY MORNING & 1 CAPSULE EVERY EVENING  270 capsule  1  . calcium carbonate (OS-CAL) 1250 MG chewable tablet Chew 1 tablet by mouth 2 (two) times daily.      Marland Kitchen lisinopril (PRINIVIL,ZESTRIL) 10 MG tablet TAKE 1 TABLET (10 MG TOTAL) BY MOUTH DAILY.  90 tablet  1  . magnesium oxide (MAG-OX) 400 MG tablet Take 400 mg by mouth daily.      . Multiple Vitamin (MULTIVITAMIN) tablet Take 1 tablet by mouth daily.      Marland Kitchen NEXIUM 40 MG capsule TAKE 1 CAPSULE (40 MG TOTAL) BY MOUTH 2 (TWO) TIMES DAILY.  180 capsule  1  . Omega-3 Fatty Acids (FISH OIL TRIPLE STRENGTH) 1400 MG CAPS Take 1 tablet by mouth daily.       No current facility-administered medications on file prior to visit.    Review of Systems Patient denies any headache, lightheadedness or dizziness.  No  significant sinus or allergy symptoms.  No chest pain.  No significant palpitations.  No increased shortness of breath.  No cough or congestion.  No nausea or vomiting.  No acid reflux.   No significant abdominal pain or cramping.  No bowel change, such as diarrhea, constipation, BRBPR or melana. No urine change.  Overall she feels good.       Objective:   Physical Exam  Filed Vitals:   03/19/14 1352  BP: 130/82  Pulse: 65  Temp: 98.1 F (36.7 C)  Resp: 14   Blood pressure recheck:  40/39  80 year old female in no acute distress.   HEENT:  Nares- clear.  Oropharynx - without lesions. NECK:  Supple.  Nontender.  No audible bruit.  HEART:  Appears to be regular. LUNGS:  No crackles or wheezing audible.  Respirations even and unlabored.  RADIAL PULSE:  Equal bilaterally.  ABDOMEN:  Soft, nontender.  Bowel sounds present and normal.  No audible abdominal bruit.     EXTREMITIES:  No increased edema present.  DP pulses palpable and equal bilaterally.          Assessment & Plan:  GI.  Colonoscopy 02/05/08 - internal hemorrhoids.  States she had a colonoscopy in the last 1-2 years.  Need results.   Doing well.  Follow.   HISTORY OF OVARIAN CYST.  Was followed by gyn.  Has had serial ultrasounds.  They have released her and felt no further w/up warranted.  Had CT abdomen/pelvis - negative for acute abnoramlity (03/29/12).    PULMONARY.  Followed by Dr Raul Del.  Breathing stable.  Follow.   INCREASED PSYCHOSOCIAL STRESSORS.  On Zoloft.  Doing well.  Follow.    CARDIOVASCULAR.  Previously had problems with increased palpitations.  Improved now.  Saw cardiology.  Had holter.  PACs.  Doing well. Now.  Follow.      HEALTH MAINTENANCE.  Physical 11/16/13.  Colonoscopy as outlined.  She is s/p hysterectomy and does not require yearly pap smears.  Mammogram 06/07/13 - Birads II.

## 2014-05-08 DIAGNOSIS — L80 Vitiligo: Secondary | ICD-10-CM | POA: Diagnosis not present

## 2014-05-08 DIAGNOSIS — Z1283 Encounter for screening for malignant neoplasm of skin: Secondary | ICD-10-CM | POA: Diagnosis not present

## 2014-05-08 DIAGNOSIS — L940 Localized scleroderma [morphea]: Secondary | ICD-10-CM | POA: Diagnosis not present

## 2014-05-08 DIAGNOSIS — B372 Candidiasis of skin and nail: Secondary | ICD-10-CM | POA: Diagnosis not present

## 2014-06-22 ENCOUNTER — Other Ambulatory Visit: Payer: Self-pay | Admitting: Internal Medicine

## 2014-06-24 ENCOUNTER — Ambulatory Visit: Payer: Self-pay | Admitting: Internal Medicine

## 2014-06-24 DIAGNOSIS — Z1231 Encounter for screening mammogram for malignant neoplasm of breast: Secondary | ICD-10-CM | POA: Diagnosis not present

## 2014-06-24 LAB — HM MAMMOGRAPHY: HM Mammogram: NEGATIVE

## 2014-06-25 ENCOUNTER — Encounter: Payer: Self-pay | Admitting: Internal Medicine

## 2014-07-08 ENCOUNTER — Other Ambulatory Visit: Payer: Self-pay | Admitting: Internal Medicine

## 2014-07-23 ENCOUNTER — Other Ambulatory Visit (INDEPENDENT_AMBULATORY_CARE_PROVIDER_SITE_OTHER): Payer: Medicare Other

## 2014-07-23 DIAGNOSIS — R739 Hyperglycemia, unspecified: Secondary | ICD-10-CM

## 2014-07-23 DIAGNOSIS — I10 Essential (primary) hypertension: Secondary | ICD-10-CM | POA: Diagnosis not present

## 2014-07-23 DIAGNOSIS — R7309 Other abnormal glucose: Secondary | ICD-10-CM

## 2014-07-23 DIAGNOSIS — E785 Hyperlipidemia, unspecified: Secondary | ICD-10-CM

## 2014-07-23 LAB — LIPID PANEL
CHOL/HDL RATIO: 3
Cholesterol: 166 mg/dL (ref 0–200)
HDL: 48.6 mg/dL (ref >39.00)
LDL Cholesterol: 90 mg/dL (ref 0–99)
NONHDL: 117.4
Triglycerides: 137 mg/dL (ref 0.0–149.0)
VLDL: 27.4 mg/dL (ref 0.0–40.0)

## 2014-07-23 LAB — HEMOGLOBIN A1C: HEMOGLOBIN A1C: 6.5 % (ref 4.6–6.5)

## 2014-07-23 LAB — BASIC METABOLIC PANEL
BUN: 18 mg/dL (ref 6–23)
CO2: 32 mEq/L (ref 19–32)
CREATININE: 1 mg/dL (ref 0.4–1.2)
Calcium: 9.1 mg/dL (ref 8.4–10.5)
Chloride: 103 mEq/L (ref 96–112)
GFR: 54.12 mL/min — AB (ref >60.00)
Glucose, Bld: 106 mg/dL — ABNORMAL HIGH (ref 70–99)
Potassium: 4.8 mEq/L (ref 3.5–5.1)
Sodium: 138 mEq/L (ref 135–145)

## 2014-07-23 LAB — HEPATIC FUNCTION PANEL
ALK PHOS: 69 U/L (ref 39–117)
ALT: 17 U/L (ref 0–35)
AST: 26 U/L (ref 0–37)
Albumin: 3.9 g/dL (ref 3.5–5.2)
BILIRUBIN DIRECT: 0.1 mg/dL (ref 0.0–0.3)
BILIRUBIN TOTAL: 0.5 mg/dL (ref 0.2–1.2)
Total Protein: 7 g/dL (ref 6.0–8.3)

## 2014-07-25 ENCOUNTER — Ambulatory Visit (INDEPENDENT_AMBULATORY_CARE_PROVIDER_SITE_OTHER): Payer: Medicare Other | Admitting: Internal Medicine

## 2014-07-25 ENCOUNTER — Encounter: Payer: Self-pay | Admitting: Internal Medicine

## 2014-07-25 VITALS — BP 120/60 | HR 68 | Temp 97.9°F | Ht 58.5 in | Wt 163.5 lb

## 2014-07-25 DIAGNOSIS — E785 Hyperlipidemia, unspecified: Secondary | ICD-10-CM

## 2014-07-25 DIAGNOSIS — Z23 Encounter for immunization: Secondary | ICD-10-CM | POA: Diagnosis not present

## 2014-07-25 DIAGNOSIS — M79609 Pain in unspecified limb: Secondary | ICD-10-CM

## 2014-07-25 DIAGNOSIS — E119 Type 2 diabetes mellitus without complications: Secondary | ICD-10-CM | POA: Diagnosis not present

## 2014-07-25 DIAGNOSIS — I10 Essential (primary) hypertension: Secondary | ICD-10-CM

## 2014-07-25 DIAGNOSIS — M79645 Pain in left finger(s): Secondary | ICD-10-CM

## 2014-07-25 DIAGNOSIS — K219 Gastro-esophageal reflux disease without esophagitis: Secondary | ICD-10-CM

## 2014-07-25 DIAGNOSIS — Z86010 Personal history of colonic polyps: Secondary | ICD-10-CM

## 2014-07-25 DIAGNOSIS — M810 Age-related osteoporosis without current pathological fracture: Secondary | ICD-10-CM

## 2014-07-25 NOTE — Progress Notes (Signed)
Subjective:    Patient ID: Teresa Wade, female    DOB: Nov 07, 1932, 80 y.o.   MRN: [id number]  HPI 80 year old female with past history of palpitations, hypercholesterolemia, hypertension and FCD who comes in today for a scheduled follow up.   Is doing better.  Saw cardiology.  Had Holter.  Revealed PACs.  No changes made in her medications.  She denies any increased heart rate or palpitations.  No chest pain or tightness.  No increased sob.  Eating and drinking well.   Overall she feels she is doing well.  Seeing Dr Phillip Heal for her skin lesions.   Overall feels good.   With her thumb surgery, she got out of the routine of walking.  She is starting back and is planning to go back to [facility].  Her main complaint is that of some pain at the base of her left thumb.  Persistent pain s/p surgery.  She is planning to make a f/u appt with Dr Margaretmary Eddy.  She also reports some difficulty sleeping.  She has been taking benadryl.  This helps sometimes but not every time.  Wants to try melatonin.       Past Medical History  Diagnosis Date  . Hypertension   . Anxiety   . Osteoporosis     s/p Actonel, Reclast (last 2011)  . Emphysema of lung   . Hypercholesterolemia   . GERD (gastroesophageal reflux disease)   . Fibrocystic disease of breast   . History of thrombocytopenia   . Abnormal chest CT     right hilar cyst  . Psoriasis   . Hyperglycemia   . Osteoarthritis     Current Outpatient Prescriptions on File Prior to Visit  Medication Sig Dispense Refill  . acebutolol (SECTRAL) 200 MG capsule TAKE 2 CAPSULES BY MOUTH EVERY MORNING & 1 CAPSULE EVERY EVENING  270 capsule  1  . aspirin 81 MG tablet Take 81 mg by mouth daily.      . calcium carbonate (OS-CAL) 1250 MG chewable tablet Chew 1 tablet by mouth 2 (two) times daily.      Marland Kitchen lisinopril (PRINIVIL,ZESTRIL) 10 MG tablet TAKE 1 TABLET (10 MG TOTAL) BY MOUTH DAILY.  90 tablet  1  . magnesium oxide (MAG-OX) 400 MG tablet Take 400 mg by mouth  daily.      . Multiple Vitamin (MULTIVITAMIN) tablet Take 1 tablet by mouth daily.      Marland Kitchen NEXIUM 40 MG capsule TAKE 1 CAPSULE (40 MG TOTAL) BY MOUTH 2 (TWO) TIMES DAILY.  180 capsule  1  . Omega-3 Fatty Acids (FISH OIL TRIPLE STRENGTH) 1400 MG CAPS Take 1 tablet by mouth daily.      . sertraline (ZOLOFT) 25 MG tablet Take 1 tablet (25 mg total) by mouth daily.  90 tablet  1  . simvastatin (ZOCOR) 40 MG tablet Take 1 tablet (40 mg total) by mouth at bedtime.  90 tablet  2   No current facility-administered medications on file prior to visit.    Review of Systems Patient denies any headache, lightheadedness or dizziness.  No significant sinus or allergy symptoms.  No chest pain.  No palpitations.  No increased shortness of breath.  No cough or congestion.  No nausea or vomiting.  No acid reflux.   No abdominal pain or cramping.  No bowel change, such as diarrhea, constipation, BRBPR or melana.  No urine change.  Overall she feels good.  Sleep issues as outlined.  Pain -  base of thumb as outlined.       Objective:   Physical Exam  Filed Vitals:   07/25/14 0903  BP: 120/60  Pulse: 68  Temp: 97.9 F (36.6 C)   Blood pressure recheck:  118/64, pulse 57  80 year old female in no acute distress.   HEENT:  Nares- clear.  Oropharynx - without lesions. NECK:  Supple.  Nontender.  No audible bruit.  HEART:  Appears to be regular. LUNGS:  No crackles or wheezing audible.  Respirations even and unlabored.  RADIAL PULSE:  Equal bilaterally.  ABDOMEN:  Soft, nontender.  Bowel sounds present and normal.  No audible abdominal bruit.     EXTREMITIES:  No increased edema present.  DP pulses palpable and equal bilaterally.   FEET:  No lesions.   MSK:  Some increased pain base of left thumb.          Assessment & Plan:  GI.  Colonoscopy 02/05/08 - internal hemorrhoids.  States she had a f/u colonoscopy as outlined - 07/25/12.  No follow up colonoscopy recommended.   Doing well.  Follow.   HISTORY OF  OVARIAN CYST.  Was followed by gyn.  Has had serial ultrasounds.  They have released her and felt no further w/up warranted.  Had CT abdomen/pelvis - negative for acute abnoramlity (03/29/12).    PULMONARY.  Followed by Dr Raul Del.  Breathing stable.  Follow.   INCREASED PSYCHOSOCIAL STRESSORS.  On Zoloft.  Doing well.  Follow.    CARDIOVASCULAR.  Previously had problems with increased palpitations.  Improved now.  Saw cardiology.  Had holter.  PACs.  Doing well. Now.  Follow.      HEALTH MAINTENANCE.  Physical 11/16/13.  Colonoscopy as outlined.  She is s/p hysterectomy and does not require yearly pap smears.  Mammogram 06/24/14 - Birads I.

## 2014-07-25 NOTE — Progress Notes (Signed)
Pre visit review using our clinic review tool, if applicable. No additional management support is needed unless otherwise documented below in the visit note. 

## 2014-07-28 ENCOUNTER — Encounter: Payer: Self-pay | Admitting: Internal Medicine

## 2014-07-28 DIAGNOSIS — M79646 Pain in unspecified finger(s): Secondary | ICD-10-CM | POA: Insufficient documentation

## 2014-07-28 NOTE — Assessment & Plan Note (Signed)
Continue vitamin D and weight bearing exercise.  Follow.   

## 2014-07-28 NOTE — Assessment & Plan Note (Signed)
Low cholesterol diet and exercise.  Follow lipid panel and liver function.  On simvastatin.  Follow.

## 2014-07-28 NOTE — Assessment & Plan Note (Signed)
Low carb diet and exercise.  Follow met b and a1c.    

## 2014-07-28 NOTE — Assessment & Plan Note (Signed)
Persistent pain at the base of her left thumb.  S/p surgery.  She plans to f/u with Dr Margaretmary Eddy.

## 2014-07-28 NOTE — Assessment & Plan Note (Signed)
Colonoscopy as outlined.  No follow up colonoscopy recommended.

## 2014-07-28 NOTE — Assessment & Plan Note (Signed)
Blood pressure under good control.  Same medication regimen.  Follow metabolic panel.   

## 2014-07-28 NOTE — Assessment & Plan Note (Signed)
Symptoms controlled.  Same medication regimen.  Follow.

## 2014-08-07 DIAGNOSIS — R002 Palpitations: Secondary | ICD-10-CM | POA: Diagnosis not present

## 2014-08-07 DIAGNOSIS — I10 Essential (primary) hypertension: Secondary | ICD-10-CM | POA: Diagnosis not present

## 2014-08-07 DIAGNOSIS — E785 Hyperlipidemia, unspecified: Secondary | ICD-10-CM | POA: Diagnosis not present

## 2014-09-09 ENCOUNTER — Other Ambulatory Visit: Payer: Self-pay | Admitting: Internal Medicine

## 2014-09-10 ENCOUNTER — Other Ambulatory Visit: Payer: Self-pay | Admitting: Internal Medicine

## 2014-10-10 DIAGNOSIS — M1811 Unilateral primary osteoarthritis of first carpometacarpal joint, right hand: Secondary | ICD-10-CM | POA: Diagnosis not present

## 2014-11-25 ENCOUNTER — Other Ambulatory Visit (INDEPENDENT_AMBULATORY_CARE_PROVIDER_SITE_OTHER): Payer: Medicare Other

## 2014-11-25 DIAGNOSIS — E119 Type 2 diabetes mellitus without complications: Secondary | ICD-10-CM

## 2014-11-25 DIAGNOSIS — K219 Gastro-esophageal reflux disease without esophagitis: Secondary | ICD-10-CM | POA: Diagnosis not present

## 2014-11-25 DIAGNOSIS — E139 Other specified diabetes mellitus without complications: Secondary | ICD-10-CM | POA: Diagnosis not present

## 2014-11-25 DIAGNOSIS — E785 Hyperlipidemia, unspecified: Secondary | ICD-10-CM

## 2014-11-25 LAB — BASIC METABOLIC PANEL
BUN: 21 mg/dL (ref 6–23)
CHLORIDE: 102 meq/L (ref 96–112)
CO2: 32 mEq/L (ref 19–32)
CREATININE: 1.1 mg/dL (ref 0.4–1.2)
Calcium: 9.7 mg/dL (ref 8.4–10.5)
GFR: 51.77 mL/min — ABNORMAL LOW (ref >60.00)
GLUCOSE: 111 mg/dL — AB (ref 70–99)
POTASSIUM: 4.6 meq/L (ref 3.5–5.1)
Sodium: 140 mEq/L (ref 135–145)

## 2014-11-25 LAB — CBC WITH DIFFERENTIAL/PLATELET
BASOS ABS: 0 10*3/uL (ref 0.0–0.1)
Basophils Relative: 0.4 % (ref 0.0–3.0)
EOS ABS: 0.1 10*3/uL (ref 0.0–0.7)
Eosinophils Relative: 1.7 % (ref 0.0–5.0)
HCT: 38.8 % (ref 36.0–46.0)
Hemoglobin: 12.9 g/dL (ref 12.0–15.0)
LYMPHS PCT: 26.4 % (ref 12.0–46.0)
Lymphs Abs: 1.4 10*3/uL (ref 0.7–4.0)
MCHC: 33.2 g/dL (ref 30.0–36.0)
MCV: 89 fl (ref 78.0–100.0)
MONO ABS: 0.6 10*3/uL (ref 0.1–1.0)
Monocytes Relative: 10.3 % (ref 3.0–12.0)
NEUTROS ABS: 3.3 10*3/uL (ref 1.4–7.7)
Neutrophils Relative %: 61.2 % (ref 43.0–77.0)
Platelets: 209 10*3/uL (ref 150.0–400.0)
RBC: 4.36 Mil/uL (ref 3.87–5.11)
RDW: 13.4 % (ref 11.5–15.5)
WBC: 5.5 10*3/uL (ref 4.0–10.5)

## 2014-11-25 LAB — HEPATIC FUNCTION PANEL
ALBUMIN: 4.1 g/dL (ref 3.5–5.2)
ALT: 15 U/L (ref 0–35)
AST: 25 U/L (ref 0–37)
Alkaline Phosphatase: 69 U/L (ref 39–117)
Bilirubin, Direct: 0.1 mg/dL (ref 0.0–0.3)
Total Bilirubin: 0.8 mg/dL (ref 0.2–1.2)
Total Protein: 6.9 g/dL (ref 6.0–8.3)

## 2014-11-25 LAB — LIPID PANEL
CHOLESTEROL: 187 mg/dL (ref 0–200)
HDL: 51.4 mg/dL (ref >39.00)
LDL CALC: 106 mg/dL — AB (ref 0–99)
NonHDL: 135.6
TRIGLYCERIDES: 147 mg/dL (ref 0.0–149.0)
Total CHOL/HDL Ratio: 4
VLDL: 29.4 mg/dL (ref 0.0–40.0)

## 2014-11-25 LAB — HEMOGLOBIN A1C: Hgb A1c MFr Bld: 6.8 % — ABNORMAL HIGH (ref 4.6–6.5)

## 2014-11-25 LAB — HM DIABETES EYE EXAM

## 2014-11-25 LAB — TSH: TSH: 1.51 u[IU]/mL (ref 0.35–4.50)

## 2014-11-26 ENCOUNTER — Other Ambulatory Visit: Payer: Self-pay | Admitting: Internal Medicine

## 2014-11-26 ENCOUNTER — Encounter: Payer: Self-pay | Admitting: Internal Medicine

## 2014-11-26 ENCOUNTER — Ambulatory Visit (INDEPENDENT_AMBULATORY_CARE_PROVIDER_SITE_OTHER): Payer: Medicare Other | Admitting: Internal Medicine

## 2014-11-26 VITALS — BP 118/60 | HR 67 | Temp 98.3°F | Ht 59.0 in | Wt 164.2 lb

## 2014-11-26 DIAGNOSIS — E669 Obesity, unspecified: Secondary | ICD-10-CM | POA: Diagnosis not present

## 2014-11-26 DIAGNOSIS — I10 Essential (primary) hypertension: Secondary | ICD-10-CM

## 2014-11-26 DIAGNOSIS — K219 Gastro-esophageal reflux disease without esophagitis: Secondary | ICD-10-CM | POA: Diagnosis not present

## 2014-11-26 DIAGNOSIS — E7800 Pure hypercholesterolemia, unspecified: Secondary | ICD-10-CM

## 2014-11-26 DIAGNOSIS — M79645 Pain in left finger(s): Secondary | ICD-10-CM | POA: Diagnosis not present

## 2014-11-26 DIAGNOSIS — E119 Type 2 diabetes mellitus without complications: Secondary | ICD-10-CM | POA: Diagnosis not present

## 2014-11-26 DIAGNOSIS — R739 Hyperglycemia, unspecified: Secondary | ICD-10-CM

## 2014-11-26 DIAGNOSIS — Z86010 Personal history of colonic polyps: Secondary | ICD-10-CM

## 2014-11-26 DIAGNOSIS — M810 Age-related osteoporosis without current pathological fracture: Secondary | ICD-10-CM

## 2014-11-26 LAB — HM DIABETES FOOT EXAM

## 2014-11-26 NOTE — Progress Notes (Signed)
Subjective:    Patient ID: Teresa Wade, female    DOB: [birth year], 80 y.o.   MRN: [id number]  HPI 80 year old female with past history of palpitations, hypercholesterolemia, hypertension and FCD who comes in today to follow up on these issues as well as for her complete physical exam.   Is doing better.  Previously saw cardiology.  Had Holter.  Revealed PACs.  No changes made in her medications.  She denies any increased heart rate or palpitations.  No chest pain or tightness.  No increased sob.  Eating and drinking well.   Overall she feels she is doing well.  Seeing Dr Phillip Heal for her skin lesions.   Overall feels good.   S/p thumb surgery.  Seeing Dr Margaretmary Eddy.  Suing a brace.  Doing relatively well.  Has joined a hiking club.  Planning to exercise more.  She has also noticed some persistent intermittent hoarseness.  Some burning (acid reflux).  Worse over the last two months.  Worse in the am.  Taking nexium two times a day.  Discussed adding zantac.  Discussed avoiding foods that aggravate and not eating late.  Discussed the need for referral back to GI.  She declines.       Past Medical History  Diagnosis Date  . Hypertension   . Anxiety   . Osteoporosis     s/p Actonel, Reclast (last 2011)  . Emphysema of lung   . Hypercholesterolemia   . GERD (gastroesophageal reflux disease)   . Fibrocystic disease of breast   . History of thrombocytopenia   . Abnormal chest CT     right hilar cyst  . Psoriasis   . Hyperglycemia   . Osteoarthritis     Current Outpatient Prescriptions on File Prior to Visit  Medication Sig Dispense Refill  . acebutolol (SECTRAL) 200 MG capsule TAKE 2 CAPSULES BY MOUTH EVERY MORNING & 1 CAPSULE EVERY EVENING 270 capsule 1  . aspirin 81 MG tablet Take 81 mg by mouth daily.    . calcium carbonate (OS-CAL) 1250 MG chewable tablet Chew 1 tablet by mouth 2 (two) times daily.    Marland Kitchen esomeprazole (NEXIUM) 40 MG capsule TAKE 1 CAPSULE (40 MG TOTAL) BY MOUTH 2 (TWO)  TIMES DAILY. 180 capsule 1  . lisinopril (PRINIVIL,ZESTRIL) 10 MG tablet TAKE 1 TABLET (10 MG TOTAL) BY MOUTH DAILY. 90 tablet 1  . magnesium oxide (MAG-OX) 400 MG tablet Take 400 mg by mouth daily.    . Multiple Vitamin (MULTIVITAMIN) tablet Take 1 tablet by mouth daily.    . Omega-3 Fatty Acids (FISH OIL TRIPLE STRENGTH) 1400 MG CAPS Take 1 tablet by mouth daily.    . sertraline (ZOLOFT) 25 MG tablet TAKE 1 TABLET (25 MG TOTAL) BY MOUTH DAILY. 90 tablet 1   No current facility-administered medications on file prior to visit.    Review of Systems Patient denies any headache, lightheadedness or dizziness.  No significant sinus or allergy symptoms.  No chest pain.  No palpitations.  No increased shortness of breath.  No cough or congestion.  No nausea or vomiting.  Burning and acid reflux as outlined.  Intermittent hoarseness.   No abdominal pain or cramping.  No bowel change, such as diarrhea, constipation, BRBPR or melana.  No urine change.  Overall she feels good.   Has an eye lesion.  Saw Dr George Ina      Objective:   Physical Exam  Filed Vitals:   11/26/14 0921  BP: 118/60  Pulse: 67  Temp: 98.3 F (17.62 C)   80 year old female in no acute distress.   HEENT:  Nares- clear.  Oropharynx - without lesions. NECK:  Supple.  Nontender.  No audible bruit.  HEART:  Appears to be regular. LUNGS:  No crackles or wheezing audible.  Respirations even and unlabored.  RADIAL PULSE:  Equal bilaterally.    BREASTS:  No nipple discharge or nipple retraction present.  Could not appreciate any distinct nodules or axillary adenopathy.  ABDOMEN:  Soft, nontender.  Bowel sounds present and normal.  No audible abdominal bruit.  EXTREMITIES:  No increased edema present.  DP pulses palpable and equal bilaterally.      FEET:  No lesions.        Assessment & Plan:  Obesity (BMI 30-39.9) Diet and exercise.    Essential hypertension Blood pressure doing well.  Same medication regimen.  Follow  pressures.  Follow metabolic panel.  Cr 1.1 12.28/15.    Gastroesophageal reflux disease, esophagitis presence not specified Symptoms as outlined.  On nexium bid.  Add zantac before bed.  Discussed the importance of not eating late and avoiding foods that aggravate.  Discussed referral to GI.  She wants to hold on referral. Get her back in soon to reassess.    Type 2 diabetes mellitus without complication Low carb diet and exercise.  Keep up to date with eye checks.  Follow met b and a1c.   Lab Results  Component Value Date   HGBA1C 6.8* 11/25/2014   Hypercholesterolemia Low cholesterol diet and exercise.  On simvastatin.  Follow lipid panel and liver function tests.   Lab Results  Component Value Date   CHOL 187 11/25/2014   HDL 51.40 11/25/2014   LDLCALC 106* 11/25/2014   TRIG 147.0 11/25/2014   CHOLHDL 4 11/25/2014   History of colonic polyps Colonoscopy 07/25/12.  No f/u colonoscopy recommended.    Thumb pain, left S/p surgery.  Following with Dr Margaretmary Eddy.  Stable.    Osteoporosis Continue calcium and vitamin D.  Will need f/u bone density.  Will get above issues sorted through first.    GI.  Colonoscopy 02/05/08 - internal hemorrhoids.  States she had a f/u colonoscopy as outlined - 07/25/12.  No follow up colonoscopy recommended.   Doing well.  Follow.   HISTORY OF OVARIAN CYST.  Was followed by gyn.  Has had serial ultrasounds.  They have released her and felt no further w/up warranted.  Had CT abdomen/pelvis - negative for acute abnoramlity (03/29/12).    PULMONARY.  Followed by Dr Raul Del.  Breathing stable.  Follow.   INCREASED PSYCHOSOCIAL STRESSORS.  On Zoloft.  Doing well.  Follow.    CARDIOVASCULAR.  Previously had problems with increased palpitations.  Improved now.  Saw cardiology.  Had holter.  PACs.  Doing well. Now.  Follow.      HEALTH MAINTENANCE.  Physical today.  Colonoscopy as outlined.  She is s/p hysterectomy and does not require yearly pap smears.   Mammogram 06/24/14 - Birads I.

## 2014-11-26 NOTE — Patient Instructions (Signed)
Add zantac (ranitidine) 150mg - take before bed 

## 2014-11-26 NOTE — Progress Notes (Signed)
Pre visit review using our clinic review tool, if applicable. No additional management support is needed unless otherwise documented below in the visit note. 

## 2014-12-01 ENCOUNTER — Encounter: Payer: Self-pay | Admitting: Internal Medicine

## 2014-12-01 DIAGNOSIS — Z6830 Body mass index (BMI) 30.0-30.9, adult: Secondary | ICD-10-CM | POA: Insufficient documentation

## 2014-12-17 ENCOUNTER — Other Ambulatory Visit: Payer: Self-pay | Admitting: Internal Medicine

## 2015-01-10 ENCOUNTER — Other Ambulatory Visit: Payer: Self-pay | Admitting: Internal Medicine

## 2015-01-13 ENCOUNTER — Ambulatory Visit: Payer: Medicare Other | Admitting: Internal Medicine

## 2015-01-27 ENCOUNTER — Ambulatory Visit: Payer: Medicare Other | Admitting: Internal Medicine

## 2015-03-06 ENCOUNTER — Other Ambulatory Visit: Payer: Self-pay | Admitting: Internal Medicine

## 2015-03-11 ENCOUNTER — Encounter: Payer: Self-pay | Admitting: Internal Medicine

## 2015-03-11 ENCOUNTER — Ambulatory Visit (INDEPENDENT_AMBULATORY_CARE_PROVIDER_SITE_OTHER): Payer: 59 | Admitting: Internal Medicine

## 2015-03-11 VITALS — BP 126/69 | HR 67 | Temp 98.0°F | Ht 59.0 in | Wt 158.1 lb

## 2015-03-11 DIAGNOSIS — E7800 Pure hypercholesterolemia, unspecified: Secondary | ICD-10-CM

## 2015-03-11 DIAGNOSIS — R739 Hyperglycemia, unspecified: Secondary | ICD-10-CM

## 2015-03-11 DIAGNOSIS — M810 Age-related osteoporosis without current pathological fracture: Secondary | ICD-10-CM | POA: Diagnosis not present

## 2015-03-11 DIAGNOSIS — I10 Essential (primary) hypertension: Secondary | ICD-10-CM | POA: Diagnosis not present

## 2015-03-11 DIAGNOSIS — Z860100 Personal history of colon polyps, unspecified: Secondary | ICD-10-CM

## 2015-03-11 DIAGNOSIS — E669 Obesity, unspecified: Secondary | ICD-10-CM

## 2015-03-11 DIAGNOSIS — K219 Gastro-esophageal reflux disease without esophagitis: Secondary | ICD-10-CM

## 2015-03-11 DIAGNOSIS — E119 Type 2 diabetes mellitus without complications: Secondary | ICD-10-CM

## 2015-03-11 MED ORDER — NITROGLYCERIN 0.4 MG SL SUBL: 0.4000 mg | SUBLINGUAL_TABLET | SUBLINGUAL | Status: AC | PRN

## 2015-03-11 NOTE — Progress Notes (Signed)
Patient ID: Teresa Wade, female   DOB: 1932/04/30, 81 y.o.   MRN: [id number]   Subjective:    Patient ID: Teresa Wade, female    DOB: 24-Sep-1932, 81 y.o.   MRN: [id number]  HPI  Patient here for a scheduled follow up.  Recently saw Dr Lady Gary - chest pressure.  Had stress echo and echo.  States ok.  Recommended f/u in 6 months.  Had an episode after her cardiac evaluation that occurred two hours after eating supper.  Lasted several hours.  Took TUMS and zantac.  Was ok next day.  Has not had any episodes since.  Is on nexium bid.  Still with break through symptoms.  We discussed the need for f/u with GI.  Has improved some.  Still some breakthrough and hoarseness.  Also reports some nausea.  Tries to watch what she is eating.     Past Medical History  Diagnosis Date  . Hypertension   . Anxiety   . Osteoporosis     s/p Actonel, Reclast (last 2011)  . Emphysema of lung   . Hypercholesterolemia   . GERD (gastroesophageal reflux disease)   . Fibrocystic disease of breast   . History of thrombocytopenia   . Abnormal chest CT     right hilar cyst  . Psoriasis   . Hyperglycemia   . Osteoarthritis     Current Outpatient Prescriptions on File Prior to Visit  Medication Sig Dispense Refill  . acebutolol (SECTRAL) 200 MG capsule TAKE 2 CAPSULES BY MOUTH EVERY MORNING & 1 CAPSULE EVERY EVENING 270 capsule 1  . aspirin 81 MG tablet Take 81 mg by mouth daily.    . calcium carbonate (OS-CAL) 1250 MG chewable tablet Chew 1 tablet by mouth 2 (two) times daily.    Marland Kitchen esomeprazole (NEXIUM) 40 MG capsule TAKE 1 CAPSULE (40 MG TOTAL) BY MOUTH 2 (TWO) TIMES DAILY. 180 capsule 1  . lisinopril (PRINIVIL,ZESTRIL) 10 MG tablet TAKE 1 TABLET BY MOUTH DAILY 90 tablet 1  . magnesium oxide (MAG-OX) 400 MG tablet Take 400 mg by mouth daily.    . Omega-3 Fatty Acids (FISH OIL TRIPLE STRENGTH) 1400 MG CAPS Take 1 tablet by mouth daily.    . sertraline (ZOLOFT) 25 MG tablet TAKE 1 TABLET (25 MG TOTAL) BY  MOUTH DAILY. 90 tablet 0  . simvastatin (ZOCOR) 40 MG tablet TAKE 1 TABLET (40 MG TOTAL) BY MOUTH AT BEDTIME. 90 tablet 1  . TACLONEX external suspension   2   No current facility-administered medications on file prior to visit.    Review of Systems  Constitutional: Negative for appetite change and unexpected weight change.  HENT: Negative for congestion and sinus pressure.   Respiratory: Negative for cough and shortness of breath.   Cardiovascular: Negative for chest pain and palpitations.       Does report the episode of discomfort as outlined.  Occurred after eating.  See above.    Gastrointestinal: Positive for nausea. Negative for vomiting, abdominal pain and diarrhea.  Genitourinary: Negative for dysuria and difficulty urinating.  Musculoskeletal: Negative for joint swelling.  Skin: Negative for color change and rash.  Neurological: Negative for dizziness, light-headedness and headaches.  Hematological: Negative for adenopathy. Does not bruise/bleed easily.  Psychiatric/Behavioral: Negative for dysphoric mood and agitation.       Objective:    Physical Exam  Constitutional: She appears well-developed and well-nourished. No distress.  HENT:  Nose: Nose normal.  Mouth/Throat: Oropharynx is clear and moist.  Neck: Neck supple. No thyromegaly present.  Cardiovascular: Normal rate and regular rhythm.   Pulmonary/Chest: Breath sounds normal. No respiratory distress. She has no wheezes.  Abdominal: Soft. Bowel sounds are normal. There is no tenderness.  Musculoskeletal: She exhibits no edema or tenderness.  Lymphadenopathy:    She has no cervical adenopathy.  Skin: No rash noted. No erythema.  Psychiatric: She has a normal mood and affect. Her behavior is normal.    BP 126/69 mmHg  Pulse 67  Temp(Src) 98 F (36.7 C) (Oral)  Ht $R'4\' 11"'NS$  (1.499 m)  Wt 158 lb 2 oz (71.725 kg)  BMI 31.92 kg/m2  SpO2 95%  LMP 11/09/1979 Wt Readings from Last 3 Encounters:  03/11/15 158 lb 2  oz (71.725 kg)  11/26/14 164 lb 4 oz (74.503 kg)  07/25/14 163 lb 8 oz (74.163 kg)     Lab Results  Component Value Date   WBC 5.5 11/25/2014   HGB 12.9 11/25/2014   HCT 38.8 11/25/2014   PLT 209.0 11/25/2014   GLUCOSE 111* 11/25/2014   CHOL 187 11/25/2014   TRIG 147.0 11/25/2014   HDL 51.40 11/25/2014   LDLCALC 106* 11/25/2014   ALT 15 11/25/2014   AST 25 11/25/2014   NA 140 11/25/2014   K 4.6 11/25/2014   CL 102 11/25/2014   CREATININE 1.1 11/25/2014   BUN 21 11/25/2014   CO2 32 11/25/2014   TSH 1.51 11/25/2014   HGBA1C 6.8* 11/25/2014   MICROALBUR 0.2 03/12/2014       Assessment & Plan:   Problem List Items Addressed This Visit    Diabetes    Low carb diet and exercise.  Follow met b and a1c.        Relevant Orders   Hemoglobin A1c   Microalbumin / creatinine urine ratio   Essential hypertension    Blood pressure doing well.  Same medication regimen.  Follow pressures.  Follow metabolic panel.       Relevant Medications   nitroGLYCERIN (NITROSTAT) 0.4 MG SL tablet   Other Relevant Orders   Basic metabolic panel   GERD (gastroesophageal reflux disease) - Primary    On nexium bid.  Has had break through symptoms despite nexium bid.  Discussed the need for f/u with GI for reevaluation and possible EGD.  She is in agreement.  Referral placed.       Relevant Orders   Ambulatory referral to Gastroenterology   History of colonic polyps    Colonoscopy as outlined.  Recommended no f/u colonoscopy.       Hypercholesterolemia    Low cholesterol diet and exercise.  On simvastatin.  Follow lipid panel and liver function tests.       Relevant Medications   nitroGLYCERIN (NITROSTAT) 0.4 MG SL tablet   Other Relevant Orders   Lipid panel   Hepatic function panel   Hyperglycemia    Low carb diet and exercise.  Follow met b and a1c.       Obesity (BMI 30-39.9)    Diet and exercise.        Osteoporosis    Continue weight bearing exercise and vitamin D  supplements.          I spent 25 minutes with the patient and more than 50% of the time was spent in consultation regarding the above.     Einar Pheasant, MD

## 2015-03-11 NOTE — Progress Notes (Signed)
Pre visit review using our clinic review tool, if applicable. No additional management support is needed unless otherwise documented below in the visit note. 

## 2015-03-16 ENCOUNTER — Encounter: Payer: Self-pay | Admitting: Internal Medicine

## 2015-03-16 NOTE — Assessment & Plan Note (Signed)
On nexium bid.  Has had break through symptoms despite nexium bid.  Discussed the need for f/u with GI for reevaluation and possible EGD.  She is in agreement.  Referral placed.

## 2015-03-16 NOTE — Assessment & Plan Note (Signed)
Continue weight bearing exercise and vitamin D supplements.

## 2015-03-16 NOTE — Assessment & Plan Note (Signed)
Diet and exercise.   

## 2015-03-16 NOTE — Assessment & Plan Note (Signed)
Low cholesterol diet and exercise.  On simvastatin.  Follow lipid panel and liver function tests.   

## 2015-03-16 NOTE — Assessment & Plan Note (Signed)
Low carb diet and exercise.  Follow met b and a1c.   

## 2015-03-16 NOTE — Assessment & Plan Note (Signed)
Low carb diet and exercise.  Follow met b and a1c.  

## 2015-03-16 NOTE — Assessment & Plan Note (Signed)
Colonoscopy as outlined.  Recommended no f/u colonoscopy.

## 2015-03-16 NOTE — Assessment & Plan Note (Signed)
Blood pressure doing well.  Same medication regimen.  Follow pressures.  Follow metabolic panel.   

## 2015-03-22 NOTE — Op Note (Signed)
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   PROCEDURE PERFORMED: Excisional trapezium arthroplasty, base of left thumb.   SURGEON: Christophe Louis, M.D.   ANESTHESIA: General.   COMPLICATIONS: None.   TOURNIQUET TIME: 74 minutes.   DRAINS: None.   DESCRIPTION OF PROCEDURE: After adequate induction of general anesthesia, the left upper extremity was thoroughly prepped with alcohol and ChloraPrep and draped in standard sterile fashion. The extremity was wrapped out with the Esmarch bandage and pneumatic tourniquet elevated to 250 mmHg. Under loupe magnification, a standard dorsal incision is made over the first metacarpocarpal joint. The dissection is carefully carried down to the base of the first metacarpal. The tendons are retracted to each side and protected throughout the case. The capsule is dissected out with the periosteal. Longitudinal incision is then made in the capsule and this is reflected to each side. It was at first difficult to figure out the anatomy and the FluoroScan was brought in and this demonstrated that the trapezium actually was essentially welded to the base of the first metacarpal and was moving along with it.  After this had been figured out, the trapezium was then completely excised using the saw and the rongeur. The tendons and capsule are protected at all times. Careful palpation and also use of the FluoroScan demonstrated no residual bone present. The wound is thoroughly irrigated multiple times. Three small Gelfoam pads are then folded and compressed into the shape of a trapezium and sewn in that position using 4-0 Mersilene. This was then sewn into the floor of the wound in the exact anatomic position of the  trapezium. Mersilene was oversewn over top of this to keep it in place. The capsule is then repaired carefully with 5-0 Mersilene. Skin edges are infiltrated with 0.5% Marcaine plain. Skin is closed with a running subcuticular 3-0 Prolene. A soft bulky dressing is applied with a fiberglass splint keeping the thumb in the abducted position. The tourniquet is released. The patient is returned to the recovery room in satisfactory condition having tolerated the procedure quite well.    ____________________________ Lucas Mallow, MD ces:dp D: 12/11/2013 10:53:14 ET T: 12/11/2013 11:22:29 ET JOB#: [id number]  cc: Lucas Mallow, MD, <Dictator> Lucas Mallow MD ELECTRONICALLY SIGNED 12/20/2013 20:22

## 2015-03-26 ENCOUNTER — Other Ambulatory Visit: Payer: BLUE CROSS/BLUE SHIELD

## 2015-03-27 ENCOUNTER — Other Ambulatory Visit (INDEPENDENT_AMBULATORY_CARE_PROVIDER_SITE_OTHER): Payer: 59

## 2015-03-27 DIAGNOSIS — E7800 Pure hypercholesterolemia, unspecified: Secondary | ICD-10-CM

## 2015-03-27 DIAGNOSIS — E119 Type 2 diabetes mellitus without complications: Secondary | ICD-10-CM

## 2015-03-27 DIAGNOSIS — I10 Essential (primary) hypertension: Secondary | ICD-10-CM

## 2015-03-27 LAB — BASIC METABOLIC PANEL
BUN: 18 mg/dL (ref 6–23)
CO2: 31 meq/L (ref 19–32)
CREATININE: 1 mg/dL (ref 0.40–1.20)
Calcium: 9.3 mg/dL (ref 8.4–10.5)
Chloride: 104 mEq/L (ref 96–112)
GFR: 56.53 mL/min — ABNORMAL LOW (ref >60.00)
GLUCOSE: 107 mg/dL — AB (ref 70–99)
Potassium: 4.8 mEq/L (ref 3.5–5.1)
Sodium: 138 mEq/L (ref 135–145)

## 2015-03-27 LAB — LIPID PANEL
CHOL/HDL RATIO: 3
Cholesterol: 149 mg/dL (ref 0–200)
HDL: 46.1 mg/dL (ref >39.00)
LDL Cholesterol: 74 mg/dL (ref 0–99)
NONHDL: 102.9
Triglycerides: 143 mg/dL (ref 0.0–149.0)
VLDL: 28.6 mg/dL (ref 0.0–40.0)

## 2015-03-27 LAB — HEPATIC FUNCTION PANEL
ALBUMIN: 4 g/dL (ref 3.5–5.2)
ALT: 12 U/L (ref 0–35)
AST: 19 U/L (ref 0–37)
Alkaline Phosphatase: 67 U/L (ref 39–117)
BILIRUBIN TOTAL: 0.6 mg/dL (ref 0.2–1.2)
Bilirubin, Direct: 0.1 mg/dL (ref 0.0–0.3)
TOTAL PROTEIN: 6.5 g/dL (ref 6.0–8.3)

## 2015-03-27 LAB — HEMOGLOBIN A1C: Hgb A1c MFr Bld: 6.4 % (ref 4.6–6.5)

## 2015-03-28 ENCOUNTER — Encounter: Payer: Self-pay | Admitting: *Deleted

## 2015-04-17 ENCOUNTER — Telehealth: Payer: Self-pay | Admitting: *Deleted

## 2015-04-17 DIAGNOSIS — M79646 Pain in unspecified finger(s): Secondary | ICD-10-CM

## 2015-04-17 NOTE — Telephone Encounter (Signed)
Pt did not injure it, she states it is bending at the joint, states it has been that way for a while however it is getting worse and painful.

## 2015-04-17 NOTE — Telephone Encounter (Signed)
Pt called requesting a referral to Teresa Wade at [facility] in reference to her third finger on her right hand.  Pt states the finger is turning to the left and is painful.  Please advise

## 2015-04-17 NOTE — Telephone Encounter (Signed)
I can place an order for the referral, but need a little more information.  Did she injure it or is she concerned regarding a fracture, etc.

## 2015-04-17 NOTE — Telephone Encounter (Signed)
Order placed for ortho referral.   

## 2015-05-11 ENCOUNTER — Other Ambulatory Visit: Payer: Self-pay | Admitting: Internal Medicine

## 2015-05-25 ENCOUNTER — Other Ambulatory Visit: Payer: Self-pay | Admitting: Internal Medicine

## 2015-05-31 ENCOUNTER — Other Ambulatory Visit: Payer: Self-pay | Admitting: Internal Medicine

## 2015-06-12 ENCOUNTER — Telehealth: Payer: Self-pay | Admitting: *Deleted

## 2015-06-12 NOTE — Telephone Encounter (Signed)
Horris Latino called states Dr Pryor Ochoa wanted to advise Dr Nicki Reaper, pt is on Lisinopril requesting it be changed due to pts hoarseness.  Please advise pt

## 2015-06-12 NOTE — Telephone Encounter (Signed)
I can work her in on 06/19/15 at 10:00 to discuss changing medications.  Please schedule.  Thanks.

## 2015-06-12 NOTE — Telephone Encounter (Signed)
Spoke with pts husband.  Advised of appoint.  Her verbalized understanding.  Appoint given to Melissa N to schedule.

## 2015-06-14 ENCOUNTER — Other Ambulatory Visit: Payer: Self-pay | Admitting: Internal Medicine

## 2015-06-19 ENCOUNTER — Ambulatory Visit (INDEPENDENT_AMBULATORY_CARE_PROVIDER_SITE_OTHER): Payer: Medicare Other | Admitting: Internal Medicine

## 2015-06-19 ENCOUNTER — Encounter: Payer: Self-pay | Admitting: Internal Medicine

## 2015-06-19 VITALS — BP 118/75 | HR 66 | Temp 98.0°F | Resp 19 | Ht 59.0 in | Wt 160.5 lb

## 2015-06-19 DIAGNOSIS — K219 Gastro-esophageal reflux disease without esophagitis: Secondary | ICD-10-CM | POA: Diagnosis not present

## 2015-06-19 DIAGNOSIS — I10 Essential (primary) hypertension: Secondary | ICD-10-CM

## 2015-06-19 DIAGNOSIS — E119 Type 2 diabetes mellitus without complications: Secondary | ICD-10-CM

## 2015-06-19 DIAGNOSIS — Z1239 Encounter for other screening for malignant neoplasm of breast: Secondary | ICD-10-CM

## 2015-06-19 MED ORDER — LOSARTAN POTASSIUM 25 MG PO TABS: 25.0000 mg | ORAL_TABLET | Freq: Every day | ORAL | Status: DC

## 2015-06-19 NOTE — Patient Instructions (Signed)
Stop lisinopril.  Start losartan 25mg (one per day) 

## 2015-06-19 NOTE — Progress Notes (Signed)
Pre visit review using our clinic review tool, if applicable. No additional management support is needed unless otherwise documented below in the visit note. 

## 2015-06-19 NOTE — Progress Notes (Signed)
Patient ID: Teresa Wade, female   DOB: [birth year], 81 y.o.   MRN: [id number]   Subjective:    Patient ID: Teresa Wade, female    DOB: [birth year], 81 y.o.   MRN: [id number]  HPI  Patient here as a work in to discuss changing her blood pressure medication.  Saw ENT.  They are concerned that the lisinopril is contributing to her hoarseness.  Here to discuss changing her lisinopril. Dr Pryor Ochoa also asked her to continue nexium in the am and zantac before her evening meal.  Also instructed her to take gaviscon at night.  She states her symptoms are some better.  She is eating and drinking well.  No vomiting.  No abdominal pain.  Bowels stable.    Past Medical History  Diagnosis Date  . Hypertension   . Anxiety   . Osteoporosis     s/p Actonel, Reclast (last 2011)  . Emphysema of lung   . Hypercholesterolemia   . GERD (gastroesophageal reflux disease)   . Fibrocystic disease of breast   . History of thrombocytopenia   . Abnormal chest CT     right hilar cyst  . Psoriasis   . Hyperglycemia   . Osteoarthritis     Outpatient Encounter Prescriptions as of 06/19/2015  Medication Sig  . acebutolol (SECTRAL) 200 MG capsule TAKE 2 CAPSULES BY MOUTH EVERY MORNING & 1 CAPSULE EVERY EVENING  . aspirin 81 MG tablet Take 81 mg by mouth daily.  . calcium carbonate (OS-CAL) 1250 MG chewable tablet Chew 1 tablet by mouth 2 (two) times daily.  Marland Kitchen esomeprazole (NEXIUM) 40 MG capsule TAKE ONE CAPSULE BY MOUTH TWICE A DAY  . magnesium oxide (MAG-OX) 400 MG tablet Take 400 mg by mouth daily.  . nitroGLYCERIN (NITROSTAT) 0.4 MG SL tablet Place 1 tablet (0.4 mg total) under the tongue every 5 (five) minutes as needed for chest pain. May repeat x 1.  If persistent pain, call 911  . Omega-3 Fatty Acids (FISH OIL TRIPLE STRENGTH) 1400 MG CAPS Take 1 tablet by mouth daily.  . sertraline (ZOLOFT) 25 MG tablet TAKE 1 TABLET BY MOUTH DAILY  . simvastatin (ZOCOR) 40 MG tablet TAKE 1 TABLET BY MOUTH AT BEDTIME    . TACLONEX external suspension   . [DISCONTINUED] lisinopril (PRINIVIL,ZESTRIL) 10 MG tablet TAKE 1 TABLET BY MOUTH DAILY  . losartan (COZAAR) 25 MG tablet Take 1 tablet (25 mg total) by mouth daily.   No facility-administered encounter medications on file as of 06/19/2015.    Review of Systems  Constitutional: Negative for appetite change and unexpected weight change.  HENT: Negative for congestion and sinus pressure.   Respiratory: Negative for cough, chest tightness and shortness of breath.   Cardiovascular: Negative for chest pain, palpitations and leg swelling.  Gastrointestinal: Negative for nausea, vomiting, abdominal pain and diarrhea.  Neurological: Negative for dizziness, light-headedness and headaches.  Psychiatric/Behavioral: Negative for agitation.       Objective:     Pulse recheck - 64  Physical Exam  Constitutional: She appears well-developed and well-nourished. No distress.  HENT:  Nose: Nose normal.  Mouth/Throat: Oropharynx is clear and moist.  Neck: Neck supple. No thyromegaly present.  Cardiovascular: Normal rate and regular rhythm.   Pulmonary/Chest: Breath sounds normal. No respiratory distress. She has no wheezes.  Abdominal: Soft. Bowel sounds are normal. There is no tenderness.  Musculoskeletal: She exhibits no edema or tenderness.  Lymphadenopathy:    She has no cervical adenopathy.  Skin: No  rash noted. No erythema.  Psychiatric: She has a normal mood and affect. Her behavior is normal.    BP 118/75 mmHg  Pulse 66  Temp(Src) 98 F (36.7 C) (Oral)  Resp 19  Ht $R'4\' 11"'Yg$  (1.499 m)  Wt 160 lb 8 oz (72.802 kg)  BMI 32.40 kg/m2  SpO2 97%  LMP 11/09/1979 Wt Readings from Last 3 Encounters:  06/19/15 160 lb 8 oz (72.802 kg)  03/11/15 158 lb 2 oz (71.725 kg)  11/26/14 164 lb 4 oz (74.503 kg)     Lab Results  Component Value Date   WBC 5.5 11/25/2014   HGB 12.9 11/25/2014   HCT 38.8 11/25/2014   PLT 209.0 11/25/2014   GLUCOSE 107*  03/27/2015   CHOL 149 03/27/2015   TRIG 143.0 03/27/2015   HDL 46.10 03/27/2015   LDLCALC 74 03/27/2015   ALT 12 03/27/2015   AST 19 03/27/2015   NA 138 03/27/2015   K 4.8 03/27/2015   CL 104 03/27/2015   CREATININE 1.00 03/27/2015   BUN 18 03/27/2015   CO2 31 03/27/2015   TSH 1.51 11/25/2014   HGBA1C 6.4 03/27/2015   MICROALBUR 0.2 03/12/2014       Assessment & Plan:   Problem List Items Addressed This Visit    Diabetes    Low carb diet and exercise.  Follow met b and a1c.       Relevant Medications   losartan (COZAAR) 25 MG tablet   Essential hypertension    Blood pressure under good control.  ENT concern that lisinopril contributing to her hoarseness.  Will change to losartan $RemoveBef'25mg'[id number]$ .   Follow pressures.  Follow metabolic panel.  Get her back in soon to reassess.       Relevant Medications   losartan (COZAAR) 25 MG tablet   GERD (gastroesophageal reflux disease)    Symptoms have improved some on the nexium in the am and zantac before her evening meal and the gaviscon at night.  Keep f/u with GI and ENT.        Other Visit Diagnoses    Screening breast examination    -  Primary    Relevant Orders    MM DIGITAL SCREENING BILATERAL        Einar Pheasant, MD

## 2015-06-21 ENCOUNTER — Encounter: Payer: Self-pay | Admitting: Internal Medicine

## 2015-06-21 NOTE — Assessment & Plan Note (Addendum)
Blood pressure under good control.  ENT concern that lisinopril contributing to her hoarseness.  Will change to losartan 25mg .   Follow pressures.  Follow metabolic panel.  Get her back in soon to reassess.

## 2015-06-21 NOTE — Assessment & Plan Note (Signed)
Low carb diet and exercise.  Follow met b and a1c.  

## 2015-06-21 NOTE — Assessment & Plan Note (Signed)
Symptoms have improved some on the nexium in the am and zantac before her evening meal and the gaviscon at night.  Keep f/u with GI and ENT.

## 2015-06-26 ENCOUNTER — Ambulatory Visit: Payer: BLUE CROSS/BLUE SHIELD

## 2015-06-27 ENCOUNTER — Ambulatory Visit
Admission: RE | Admit: 2015-06-27 | Discharge: 2015-06-27 | Disposition: A | Discharge status: Home / Self Care | Payer: Medicare Other | Source: Ambulatory Visit | Attending: Internal Medicine | Admitting: Internal Medicine

## 2015-06-27 ENCOUNTER — Other Ambulatory Visit: Payer: Self-pay | Admitting: Internal Medicine

## 2015-06-27 DIAGNOSIS — Z1231 Encounter for screening mammogram for malignant neoplasm of breast: Secondary | ICD-10-CM | POA: Insufficient documentation

## 2015-06-27 DIAGNOSIS — Z1239 Encounter for other screening for malignant neoplasm of breast: Secondary | ICD-10-CM

## 2015-06-29 ENCOUNTER — Encounter: Payer: Self-pay | Admitting: Internal Medicine

## 2015-06-29 DIAGNOSIS — Z0000 Encounter for general adult medical examination without abnormal findings: Secondary | ICD-10-CM | POA: Insufficient documentation

## 2015-07-03 ENCOUNTER — Other Ambulatory Visit: Payer: Self-pay | Admitting: Internal Medicine

## 2015-07-14 ENCOUNTER — Encounter: Payer: Self-pay | Admitting: Internal Medicine

## 2015-07-14 ENCOUNTER — Ambulatory Visit (INDEPENDENT_AMBULATORY_CARE_PROVIDER_SITE_OTHER): Payer: Medicare Other | Admitting: Internal Medicine

## 2015-07-14 VITALS — BP 124/78 | HR 60 | Temp 98.2°F | Ht 59.0 in | Wt 156.0 lb

## 2015-07-14 DIAGNOSIS — R490 Dysphonia: Secondary | ICD-10-CM

## 2015-07-14 DIAGNOSIS — Z860100 Personal history of colon polyps, unspecified: Secondary | ICD-10-CM

## 2015-07-14 DIAGNOSIS — E669 Obesity, unspecified: Secondary | ICD-10-CM

## 2015-07-14 DIAGNOSIS — K219 Gastro-esophageal reflux disease without esophagitis: Secondary | ICD-10-CM | POA: Diagnosis not present

## 2015-07-14 DIAGNOSIS — E119 Type 2 diabetes mellitus without complications: Secondary | ICD-10-CM | POA: Diagnosis not present

## 2015-07-14 DIAGNOSIS — E7800 Pure hypercholesterolemia, unspecified: Secondary | ICD-10-CM

## 2015-07-14 DIAGNOSIS — I10 Essential (primary) hypertension: Secondary | ICD-10-CM

## 2015-07-14 MED ORDER — LOSARTAN POTASSIUM 25 MG PO TABS: 25.0000 mg | ORAL_TABLET | Freq: Every day | ORAL | Status: DC

## 2015-07-14 NOTE — Progress Notes (Signed)
Pre visit review using our clinic review tool, if applicable. No additional management support is needed unless otherwise documented below in the visit note. 

## 2015-07-14 NOTE — Progress Notes (Signed)
Patient ID: Teresa Wade, female   DOB: 02/27/1932, 81 y.o.   MRN: [id number]   Subjective:    Patient ID: Teresa Wade, female    DOB: 12-01-1931, 81 y.o.   MRN: [id number]  HPI  Patient here for a scheduled follow up.  Was having issues with persistent reflux and hoarseness.  Saw ENT.  Recommended changing lisinopril.  I saw her on 06/19/15 and we stopped her lisinopril and started her on losartan $RemoveBef'25mg'bKxxYBnZbF$  q day.  States is some better.  Blood pressure doing well.  No significant problems with acid reflux.  Saw GI last week.  Took her off one of the nexium capsules.  Still taking one nexium and one zantac.  Breathing doing well.  No increased cough or congestion.  Bowels doing well.     Past Medical History  Diagnosis Date  . Hypertension   . Anxiety   . Osteoporosis     s/p Actonel, Reclast (last 2011)  . Emphysema of lung   . Hypercholesterolemia   . GERD (gastroesophageal reflux disease)   . Fibrocystic disease of breast   . History of thrombocytopenia   . Abnormal chest CT     right hilar cyst  . Psoriasis   . Hyperglycemia   . Osteoarthritis     Family history and social history reviewed.     Outpatient Encounter Prescriptions as of 07/14/2015  Medication Sig  . acebutolol (SECTRAL) 200 MG capsule TAKE 2 CAPSULES BY MOUTH EVERY MORNING & 1 CAPSULE EVERY EVENING  . aspirin 81 MG tablet Take 81 mg by mouth daily.  . calcium carbonate (OS-CAL) 1250 MG chewable tablet Chew 1 tablet by mouth 2 (two) times daily.  Marland Kitchen esomeprazole (NEXIUM) 40 MG capsule TAKE ONE CAPSULE BY MOUTH TWICE A DAY (Patient taking differently: TAKE ONE CAPSULE BY MOUTH once daily)  . losartan (COZAAR) 25 MG tablet Take 1 tablet (25 mg total) by mouth daily.  . magnesium oxide (MAG-OX) 400 MG tablet Take 400 mg by mouth daily.  . nitroGLYCERIN (NITROSTAT) 0.4 MG SL tablet Place 1 tablet (0.4 mg total) under the tongue every 5 (five) minutes as needed for chest pain. May repeat x 1.  If persistent pain,  call 911  . Omega-3 Fatty Acids (FISH OIL TRIPLE STRENGTH) 1400 MG CAPS Take 1 tablet by mouth daily.  . ranitidine (ZANTAC) 150 MG tablet Take 150 mg by mouth at bedtime.  . sertraline (ZOLOFT) 25 MG tablet TAKE 1 TABLET BY MOUTH DAILY  . simvastatin (ZOCOR) 40 MG tablet TAKE 1 TABLET BY MOUTH AT BEDTIME  . TACLONEX external suspension   . [DISCONTINUED] losartan (COZAAR) 25 MG tablet Take 1 tablet (25 mg total) by mouth daily.  . [DISCONTINUED] lisinopril (PRINIVIL,ZESTRIL) 10 MG tablet TAKE 1 TABLET BY MOUTH DAILY (Patient not taking: Reported on 07/14/2015)   No facility-administered encounter medications on file as of 07/14/2015.    Review of Systems  Constitutional: Negative for appetite change and unexpected weight change.  HENT: Negative for congestion and sinus pressure.   Respiratory: Negative for cough, chest tightness and shortness of breath.   Cardiovascular: Negative for chest pain, palpitations and leg swelling.  Gastrointestinal: Negative for nausea, vomiting, abdominal pain and diarrhea.       Seeing GI for acid reflux.  Saw ENT - persistent hoarseness.    Genitourinary: Negative for dysuria and difficulty urinating.  Musculoskeletal: Negative for back pain and joint swelling.  Skin: Negative for color change and wound.  Neurological:  Negative for dizziness, light-headedness and headaches.  Hematological: Negative for adenopathy. Does not bruise/bleed easily.  Psychiatric/Behavioral: Negative for dysphoric mood and agitation.       Objective:    Physical Exam  Constitutional: She appears well-developed and well-nourished. No distress.  HENT:  Nose: Nose normal.  Mouth/Throat: Oropharynx is clear and moist.  Eyes: Conjunctivae are normal. Right eye exhibits no discharge. Left eye exhibits no discharge.  Neck: Neck supple. No thyromegaly present.  Cardiovascular: Normal rate and regular rhythm.   Pulmonary/Chest: Breath sounds normal. No respiratory distress. She  has no wheezes.  Abdominal: Soft. Bowel sounds are normal. There is no tenderness.  Musculoskeletal: She exhibits no edema or tenderness.  Lymphadenopathy:    She has no cervical adenopathy.  Skin: No rash noted. No erythema.  Psychiatric: She has a normal mood and affect. Her behavior is normal.    BP 124/78 mmHg  Pulse 60  Temp(Src) 98.2 F (36.8 C) (Oral)  Ht $R'4\' 11"'vP$  (1.499 m)  Wt 156 lb (70.761 kg)  BMI 31.49 kg/m2  SpO2 95%  LMP 11/09/1979 Wt Readings from Last 3 Encounters:  07/14/15 156 lb (70.761 kg)  06/19/15 160 lb 8 oz (72.802 kg)  03/11/15 158 lb 2 oz (71.725 kg)     Lab Results  Component Value Date   WBC 5.5 11/25/2014   HGB 12.9 11/25/2014   HCT 38.8 11/25/2014   PLT 209.0 11/25/2014   GLUCOSE 107* 03/27/2015   CHOL 149 03/27/2015   TRIG 143.0 03/27/2015   HDL 46.10 03/27/2015   LDLCALC 74 03/27/2015   ALT 12 03/27/2015   AST 19 03/27/2015   NA 138 03/27/2015   K 4.8 03/27/2015   CL 104 03/27/2015   CREATININE 1.00 03/27/2015   BUN 18 03/27/2015   CO2 31 03/27/2015   TSH 1.51 11/25/2014   HGBA1C 6.4 03/27/2015   MICROALBUR 0.2 03/12/2014    Mm Screening Breast Tomo Bilateral  06/27/2015   CLINICAL DATA:  Screening.  EXAM: DIGITAL SCREENING BILATERAL MAMMOGRAM WITH 3D TOMO WITH CAD  COMPARISON:  Previous exam(s).  ACR Breast Density Category b: There are scattered areas of fibroglandular density.  FINDINGS: There are no findings suspicious for malignancy. Images were processed with CAD.  IMPRESSION: No mammographic evidence of malignancy. A result letter of this screening mammogram will be mailed directly to the patient.  RECOMMENDATION: Screening mammogram in one year. (Code:SM-B-01Y)  BI-RADS CATEGORY  1: Negative.   Electronically Signed   By: Fidela Salisbury M.D.   On: 06/27/2015 14:47       Assessment & Plan:   Problem List Items Addressed This Visit    Diabetes    Low carb diet and exercise.  Follow met b and a1c.       Relevant  Medications   losartan (COZAAR) 25 MG tablet   Other Relevant Orders   Hemoglobin A1c   Essential hypertension - Primary    Blood pressure under good control.  Doing well on losartan $RemoveBef'25mg'ULzrcmtYGK$ .  Continue same medication regimen.  Follow pressures.  Follow metabolic panel.        Relevant Medications   losartan (COZAAR) 25 MG tablet   Other Relevant Orders   Basic metabolic panel   GERD (gastroesophageal reflux disease)    Seeing GI.  Only taking one nexium daily now.  Changed lisinopril recently.  See if helps with hoarseness.  Follow.  Doing better.       Relevant Medications   ranitidine (ZANTAC) 150 MG  tablet   History of colonic polyps    Colonoscopy 07/25/12 - as outlined.  No repeat colonoscopy needed.       Hoarseness    Some better after changing lisinopril.  Now on losartan.  Follow.       Hypercholesterolemia    On simvastatin.  Low cholesterol diet and exercise.  Follow lipid panel and liver function tests.        Relevant Medications   losartan (COZAAR) 25 MG tablet   Other Relevant Orders   Lipid panel   Hepatic function panel   Obesity (BMI 30-39.9)    Diet and exercise.  Follow.            Einar Pheasant, MD

## 2015-07-16 ENCOUNTER — Encounter: Payer: Self-pay | Admitting: Internal Medicine

## 2015-07-16 DIAGNOSIS — R490 Dysphonia: Secondary | ICD-10-CM | POA: Insufficient documentation

## 2015-07-16 NOTE — Assessment & Plan Note (Signed)
Diet and exercise.  Follow.  

## 2015-07-16 NOTE — Assessment & Plan Note (Signed)
Seeing GI.  Only taking one nexium daily now.  Changed lisinopril recently.  See if helps with hoarseness.  Follow.  Doing better.

## 2015-07-16 NOTE — Assessment & Plan Note (Signed)
On simvastatin.  Low cholesterol diet and exercise.  Follow lipid panel and liver function tests.   

## 2015-07-16 NOTE — Assessment & Plan Note (Signed)
Colonoscopy 07/25/12 - as outlined.  No repeat colonoscopy needed.

## 2015-07-16 NOTE — Assessment & Plan Note (Signed)
Low carb diet and exercise.  Follow met b and a1c.  

## 2015-07-16 NOTE — Assessment & Plan Note (Addendum)
Blood pressure under good control.  Doing well on losartan 25mg .  Continue same medication regimen.  Follow pressures.  Follow metabolic panel.

## 2015-07-16 NOTE — Assessment & Plan Note (Signed)
Some better after changing lisinopril.  Now on losartan.  Follow.

## 2015-07-28 ENCOUNTER — Other Ambulatory Visit (INDEPENDENT_AMBULATORY_CARE_PROVIDER_SITE_OTHER): Payer: Medicare Other

## 2015-07-28 DIAGNOSIS — E7800 Pure hypercholesterolemia, unspecified: Secondary | ICD-10-CM

## 2015-07-28 DIAGNOSIS — E119 Type 2 diabetes mellitus without complications: Secondary | ICD-10-CM | POA: Diagnosis not present

## 2015-07-28 DIAGNOSIS — I10 Essential (primary) hypertension: Secondary | ICD-10-CM | POA: Diagnosis not present

## 2015-07-28 LAB — LIPID PANEL
CHOLESTEROL: 160 mg/dL (ref 0–200)
HDL: 47.8 mg/dL (ref >39.00)
LDL CALC: 82 mg/dL (ref 0–99)
NONHDL: 112.13
Total CHOL/HDL Ratio: 3
Triglycerides: 150 mg/dL — ABNORMAL HIGH (ref 0.0–149.0)
VLDL: 30 mg/dL (ref 0.0–40.0)

## 2015-07-28 LAB — BASIC METABOLIC PANEL
BUN: 19 mg/dL (ref 6–23)
CHLORIDE: 101 meq/L (ref 96–112)
CO2: 31 meq/L (ref 19–32)
Calcium: 9.3 mg/dL (ref 8.4–10.5)
Creatinine, Ser: 1.09 mg/dL (ref 0.40–1.20)
GFR: 51.13 mL/min — ABNORMAL LOW (ref >60.00)
Glucose, Bld: 102 mg/dL — ABNORMAL HIGH (ref 70–99)
POTASSIUM: 4.8 meq/L (ref 3.5–5.1)
SODIUM: 140 meq/L (ref 135–145)

## 2015-07-28 LAB — HEPATIC FUNCTION PANEL
ALT: 12 U/L (ref 0–35)
AST: 18 U/L (ref 0–37)
Albumin: 4 g/dL (ref 3.5–5.2)
Alkaline Phosphatase: 72 U/L (ref 39–117)
BILIRUBIN TOTAL: 0.5 mg/dL (ref 0.2–1.2)
Bilirubin, Direct: 0.1 mg/dL (ref 0.0–0.3)
Total Protein: 6.5 g/dL (ref 6.0–8.3)

## 2015-07-28 LAB — HEMOGLOBIN A1C: HEMOGLOBIN A1C: 6.3 % (ref 4.6–6.5)

## 2015-07-29 ENCOUNTER — Encounter: Payer: Self-pay | Admitting: *Deleted

## 2015-08-29 ENCOUNTER — Other Ambulatory Visit: Payer: Self-pay | Admitting: Internal Medicine

## 2015-09-01 ENCOUNTER — Other Ambulatory Visit: Payer: Self-pay | Admitting: Family Medicine

## 2015-11-12 ENCOUNTER — Other Ambulatory Visit: Payer: Self-pay | Admitting: Internal Medicine

## 2015-11-27 ENCOUNTER — Ambulatory Visit: Payer: BLUE CROSS/BLUE SHIELD | Admitting: Internal Medicine

## 2015-12-05 ENCOUNTER — Ambulatory Visit (INDEPENDENT_AMBULATORY_CARE_PROVIDER_SITE_OTHER): Payer: Medicare Other | Admitting: Internal Medicine

## 2015-12-05 ENCOUNTER — Encounter: Payer: Self-pay | Admitting: Internal Medicine

## 2015-12-05 VITALS — BP 120/60 | HR 68 | Temp 97.6°F | Resp 18 | Ht 59.0 in | Wt 165.4 lb

## 2015-12-05 DIAGNOSIS — E119 Type 2 diabetes mellitus without complications: Secondary | ICD-10-CM

## 2015-12-05 DIAGNOSIS — E7800 Pure hypercholesterolemia, unspecified: Secondary | ICD-10-CM

## 2015-12-05 DIAGNOSIS — Z0000 Encounter for general adult medical examination without abnormal findings: Secondary | ICD-10-CM | POA: Diagnosis not present

## 2015-12-05 DIAGNOSIS — E669 Obesity, unspecified: Secondary | ICD-10-CM

## 2015-12-05 DIAGNOSIS — M810 Age-related osteoporosis without current pathological fracture: Secondary | ICD-10-CM

## 2015-12-05 DIAGNOSIS — I10 Essential (primary) hypertension: Secondary | ICD-10-CM

## 2015-12-05 DIAGNOSIS — K219 Gastro-esophageal reflux disease without esophagitis: Secondary | ICD-10-CM | POA: Diagnosis not present

## 2015-12-05 DIAGNOSIS — Z86010 Personal history of colonic polyps: Secondary | ICD-10-CM

## 2015-12-05 LAB — HM DIABETES FOOT EXAM

## 2015-12-05 NOTE — Progress Notes (Signed)
Patient ID: AVALEE CASTRELLON, female   DOB: Apr 07, 1932, 82 y.o.   MRN: [id number]   Subjective:    Patient ID: ROBECCA FULGHAM, female    DOB: 1932/10/17, 82 y.o.   MRN: [id number]  HPI  Patient with past history of hypercholesterolemia, GERD and hypertension.  She comes in today to follow up on these issues as well as for a complete physical exam.  She reports that she is doing well.  Feels good.  We discussed exercise and diet.  No chest pain or tightness.  No increased sob.  Breathing stable and doing well.  No acid reflux reported.  No problems swallowing.  No abdominal pain or cramping.  Bowels stable.  No urinary issues.    Past Medical History  Diagnosis Date  . Hypertension   . Anxiety   . Osteoporosis     s/p Actonel, Reclast (last 2011)  . Emphysema of lung ([place])   . Hypercholesterolemia   . GERD (gastroesophageal reflux disease)   . Fibrocystic disease of breast   . History of thrombocytopenia   . Abnormal chest CT     right hilar cyst  . Psoriasis   . Hyperglycemia   . Osteoarthritis    Past Surgical History  Procedure Laterality Date  . Abdominal hysterectomy  1980  . Breast excisional biopsy  1976    benign   Family History  Problem Relation Age of Onset  . Heart disease Mother   . Mental illness      sibling, suicide  . Colon cancer Sister    Social History   Social History  . Marital Status: Married    Spouse Name: N/A  . Number of Children: 1  . Years of Education: N/A   Social History Main Topics  . Smoking status: Former Research scientist (life sciences)  . Smokeless tobacco: Never Used  . Alcohol Use: No  . Drug Use: No  . Sexual Activity: Not Asked   Other Topics Concern  . None   Social History Narrative    Outpatient Encounter Prescriptions as of 12/05/2015  Medication Sig  . acebutolol (SECTRAL) 200 MG capsule TAKE 2 CAPSULES BY MOUTH EVERY MORNING & 1 CAPSULE EVERY EVENING  . aspirin 81 MG tablet Take 81 mg by mouth daily.  . calcium carbonate (OS-CAL) 1250  MG chewable tablet Chew 1 tablet by mouth 2 (two) times daily.  Marland Kitchen esomeprazole (NEXIUM) 40 MG capsule TAKE ONE CAPSULE BY MOUTH TWICE A DAY (Patient taking differently: TAKE ONE CAPSULE BY MOUTH once daily)  . losartan (COZAAR) 25 MG tablet Take 1 tablet (25 mg total) by mouth daily.  . magnesium oxide (MAG-OX) 400 MG tablet Take 400 mg by mouth daily.  . nitroGLYCERIN (NITROSTAT) 0.4 MG SL tablet Place 1 tablet (0.4 mg total) under the tongue every 5 (five) minutes as needed for chest pain. May repeat x 1.  If persistent pain, call 911  . Omega-3 Fatty Acids (FISH OIL TRIPLE STRENGTH) 1400 MG CAPS Take 1 tablet by mouth daily.  . ranitidine (ZANTAC) 150 MG tablet Take 150 mg by mouth at bedtime.  . sertraline (ZOLOFT) 25 MG tablet TAKE 1 TABLET BY MOUTH DAILY  . simvastatin (ZOCOR) 40 MG tablet TAKE 1 TABLET BY MOUTH AT BEDTIME  . TACLONEX external suspension    No facility-administered encounter medications on file as of 12/05/2015.    Review of Systems  Constitutional: Negative for appetite change and unexpected weight change.  HENT: Negative for congestion and sinus pressure.  Eyes: Negative for pain and visual disturbance.  Respiratory: Negative for cough, chest tightness and shortness of breath.   Cardiovascular: Negative for chest pain, palpitations and leg swelling.  Gastrointestinal: Negative for nausea, vomiting, abdominal pain and diarrhea.  Genitourinary: Negative for dysuria and difficulty urinating.  Musculoskeletal: Negative for back pain and joint swelling.  Skin: Negative for color change and rash.  Neurological: Negative for dizziness, light-headedness and headaches.  Hematological: Negative for adenopathy. Does not bruise/bleed easily.  Psychiatric/Behavioral: Negative for dysphoric mood and agitation.       Objective:     Blood pressure rechecked by me:  128/78  Physical Exam  Constitutional: She is oriented to person, place, and time. She appears well-developed  and well-nourished. No distress.  HENT:  Nose: Nose normal.  Mouth/Throat: Oropharynx is clear and moist.  Eyes: Right eye exhibits no discharge. Left eye exhibits no discharge. No scleral icterus.  Neck: Neck supple. No thyromegaly present.  Cardiovascular: Normal rate and regular rhythm.   Pulmonary/Chest: Breath sounds normal. No accessory muscle usage. No tachypnea. No respiratory distress. She has no decreased breath sounds. She has no wheezes. She has no rhonchi. Right breast exhibits no inverted nipple, no mass, no nipple discharge and no tenderness (no axillary adenopathy). Left breast exhibits no inverted nipple, no mass, no nipple discharge and no tenderness (no axilarry adenopathy).  Abdominal: Soft. Bowel sounds are normal. There is no tenderness.  Musculoskeletal: She exhibits no edema or tenderness.  Lymphadenopathy:    She has no cervical adenopathy.  Neurological: She is alert and oriented to person, place, and time.  Skin: Skin is warm. No rash noted. No pallor.  Psychiatric: She has a normal mood and affect. Her behavior is normal.    BP 120/60 mmHg  Pulse 68  Temp(Src) 97.6 F (36.4 C) (Oral)  Resp 18  Ht '4\' 11"'$  (1.499 m)  Wt 165 lb 6 oz (75.014 kg)  BMI 33.38 kg/m2  SpO2 97%  LMP 11/09/1979 Wt Readings from Last 3 Encounters:  12/05/15 165 lb 6 oz (75.014 kg)  07/14/15 156 lb (70.761 kg)  06/19/15 160 lb 8 oz (72.802 kg)     Lab Results  Component Value Date   WBC 5.5 11/25/2014   HGB 12.9 11/25/2014   HCT 38.8 11/25/2014   PLT 209.0 11/25/2014   GLUCOSE 102* 07/28/2015   CHOL 160 07/28/2015   TRIG 150.0* 07/28/2015   HDL 47.80 07/28/2015   LDLCALC 82 07/28/2015   ALT 12 07/28/2015   AST 18 07/28/2015   NA 140 07/28/2015   K 4.8 07/28/2015   CL 101 07/28/2015   CREATININE 1.09 07/28/2015   BUN 19 07/28/2015   CO2 31 07/28/2015   TSH 1.51 11/25/2014   HGBA1C 6.3 07/28/2015   MICROALBUR 0.2 03/12/2014    Mm Screening Breast Tomo  Bilateral  06/27/2015  CLINICAL DATA:  Screening. EXAM: DIGITAL SCREENING BILATERAL MAMMOGRAM WITH 3D TOMO WITH CAD COMPARISON:  Previous exam(s). ACR Breast Density Category b: There are scattered areas of fibroglandular density. FINDINGS: There are no findings suspicious for malignancy. Images were processed with CAD. IMPRESSION: No mammographic evidence of malignancy. A result letter of this screening mammogram will be mailed directly to the patient. RECOMMENDATION: Screening mammogram in one year. (Code:SM-B-01Y) BI-RADS CATEGORY  1: Negative. Electronically Signed   By: Fidela Salisbury M.D.   On: 06/27/2015 14:47       Assessment & Plan:   Problem List Items Addressed This Visit    Diabetes (  [place])    Low carb diet and exercise.  Follow met b and a1c.       Relevant Orders   TSH   Basic metabolic panel   Hemoglobin A1c   Microalbumin / creatinine urine ratio   Essential hypertension - Primary    Blood pressure under good control.  Continue same medication regimen.  Follow pressures.  Follow metabolic panel.        Relevant Orders   CBC with Differential/Platelet   GERD (gastroesophageal reflux disease)    On nexium.  Sees GI.  Upper symptoms controlled.  Follow.        Health care maintenance    Physical today 12/05/15.  Mammogram 06/27/15 - Birads I.  Colonoscopy 2013 as outlined.  Will need f/u bone density.       History of colonic polyps    Colonoscopy 8//27/13 as outlined - tubular adenoma.  No repeat colonoscopy needed.        Hypercholesterolemia    On simvastatin.  Low cholesterol diet and exercise.  Follow lipid panel and liver function tests.        Relevant Orders   Lipid panel   Hepatic function panel   Obesity (BMI 30-39.9)    Diet and exercise.  Follow.       Osteoporosis    Check vitamin D level.       Relevant Orders   VITAMIN D 25 Hydroxy (Vit-D Deficiency, Fractures)       Einar Pheasant, MD

## 2015-12-05 NOTE — Progress Notes (Signed)
Pre-visit discussion using our clinic review tool. No additional management support is needed unless otherwise documented below in the visit note.  

## 2015-12-07 ENCOUNTER — Encounter: Payer: Self-pay | Admitting: Internal Medicine

## 2015-12-07 NOTE — Assessment & Plan Note (Signed)
Check vitamin D level 

## 2015-12-07 NOTE — Assessment & Plan Note (Signed)
Physical today 12/05/15.  Mammogram 06/27/15 - Birads I.  Colonoscopy 2013 as outlined.  Will need f/u bone density.

## 2015-12-07 NOTE — Assessment & Plan Note (Signed)
Blood pressure under good control.  Continue same medication regimen.  Follow pressures.  Follow metabolic panel.   

## 2015-12-07 NOTE — Assessment & Plan Note (Signed)
Diet and exercise.  Follow.  

## 2015-12-07 NOTE — Assessment & Plan Note (Signed)
On nexium.  Sees GI.  Upper symptoms controlled.  Follow.

## 2015-12-07 NOTE — Assessment & Plan Note (Signed)
Low carb diet and exercise.  Follow met b and a1c.  

## 2015-12-07 NOTE — Assessment & Plan Note (Signed)
On simvastatin.  Low cholesterol diet and exercise.  Follow lipid panel and liver function tests.   

## 2015-12-07 NOTE — Assessment & Plan Note (Signed)
Colonoscopy 8//27/13 as outlined - tubular adenoma.  No repeat colonoscopy needed.

## 2015-12-21 ENCOUNTER — Other Ambulatory Visit: Payer: Self-pay | Admitting: Internal Medicine

## 2015-12-22 NOTE — Telephone Encounter (Signed)
Okay to refill? Last seen on 12/05/15.

## 2015-12-22 NOTE — Telephone Encounter (Signed)
ok'd refill for sertraline #90 with one refill.

## 2015-12-26 ENCOUNTER — Other Ambulatory Visit (INDEPENDENT_AMBULATORY_CARE_PROVIDER_SITE_OTHER): Payer: Medicare Other

## 2015-12-26 DIAGNOSIS — E7800 Pure hypercholesterolemia, unspecified: Secondary | ICD-10-CM | POA: Diagnosis not present

## 2015-12-26 DIAGNOSIS — I10 Essential (primary) hypertension: Secondary | ICD-10-CM | POA: Diagnosis not present

## 2015-12-26 DIAGNOSIS — M810 Age-related osteoporosis without current pathological fracture: Secondary | ICD-10-CM | POA: Diagnosis not present

## 2015-12-26 DIAGNOSIS — E119 Type 2 diabetes mellitus without complications: Secondary | ICD-10-CM | POA: Diagnosis not present

## 2015-12-26 LAB — CBC WITH DIFFERENTIAL/PLATELET
Basophils Absolute: 0 10*3/uL (ref 0.0–0.1)
Basophils Relative: 0.2 % (ref 0.0–3.0)
EOS PCT: 1.2 % (ref 0.0–5.0)
Eosinophils Absolute: 0.1 10*3/uL (ref 0.0–0.7)
HCT: 38.8 % (ref 36.0–46.0)
Hemoglobin: 12.7 g/dL (ref 12.0–15.0)
LYMPHS ABS: 1.6 10*3/uL (ref 0.7–4.0)
Lymphocytes Relative: 28.4 % (ref 12.0–46.0)
MCHC: 32.8 g/dL (ref 30.0–36.0)
MCV: 88.1 fl (ref 78.0–100.0)
MONOS PCT: 13 % — AB (ref 3.0–12.0)
Monocytes Absolute: 0.7 10*3/uL (ref 0.1–1.0)
NEUTROS ABS: 3.2 10*3/uL (ref 1.4–7.7)
Neutrophils Relative %: 57.2 % (ref 43.0–77.0)
PLATELETS: 201 10*3/uL (ref 150.0–400.0)
RBC: 4.4 Mil/uL (ref 3.87–5.11)
RDW: 13.9 % (ref 11.5–15.5)
WBC: 5.5 10*3/uL (ref 4.0–10.5)

## 2015-12-26 LAB — BASIC METABOLIC PANEL
BUN: 23 mg/dL (ref 6–23)
CO2: 31 mEq/L (ref 19–32)
Calcium: 9.3 mg/dL (ref 8.4–10.5)
Chloride: 101 mEq/L (ref 96–112)
Creatinine, Ser: 1 mg/dL (ref 0.40–1.20)
GFR: 56.42 mL/min — ABNORMAL LOW (ref >60.00)
Glucose, Bld: 109 mg/dL — ABNORMAL HIGH (ref 70–99)
POTASSIUM: 4.7 meq/L (ref 3.5–5.1)
SODIUM: 138 meq/L (ref 135–145)

## 2015-12-26 LAB — HEPATIC FUNCTION PANEL
ALBUMIN: 4.1 g/dL (ref 3.5–5.2)
ALT: 13 U/L (ref 0–35)
AST: 20 U/L (ref 0–37)
Alkaline Phosphatase: 70 U/L (ref 39–117)
Bilirubin, Direct: 0.1 mg/dL (ref 0.0–0.3)
Total Bilirubin: 0.6 mg/dL (ref 0.2–1.2)
Total Protein: 6.8 g/dL (ref 6.0–8.3)

## 2015-12-26 LAB — LIPID PANEL
CHOL/HDL RATIO: 3
Cholesterol: 150 mg/dL (ref 0–200)
HDL: 51.3 mg/dL (ref >39.00)
LDL Cholesterol: 70 mg/dL (ref 0–99)
NONHDL: 98.69
Triglycerides: 144 mg/dL (ref 0.0–149.0)
VLDL: 28.8 mg/dL (ref 0.0–40.0)

## 2015-12-26 LAB — HEMOGLOBIN A1C: HEMOGLOBIN A1C: 6.5 % (ref 4.6–6.5)

## 2015-12-26 LAB — VITAMIN D 25 HYDROXY (VIT D DEFICIENCY, FRACTURES): VITD: 40.13 ng/mL (ref 30.00–100.00)

## 2015-12-26 LAB — MICROALBUMIN / CREATININE URINE RATIO
Creatinine,U: 198.7 mg/dL
MICROALB UR: 0.8 mg/dL (ref 0.0–1.9)
MICROALB/CREAT RATIO: 0.4 mg/g (ref 0.0–30.0)

## 2015-12-26 LAB — TSH: TSH: 1.06 u[IU]/mL (ref 0.35–4.50)

## 2015-12-28 ENCOUNTER — Encounter: Payer: Self-pay | Admitting: *Deleted

## 2015-12-29 LAB — HM DIABETES EYE EXAM

## 2015-12-30 ENCOUNTER — Encounter: Payer: Self-pay | Admitting: Internal Medicine

## 2016-01-01 ENCOUNTER — Other Ambulatory Visit: Payer: Self-pay | Admitting: Internal Medicine

## 2016-01-02 NOTE — Telephone Encounter (Signed)
Sectral 200mg  filled

## 2016-01-09 ENCOUNTER — Other Ambulatory Visit: Payer: Self-pay | Admitting: Internal Medicine

## 2016-01-13 ENCOUNTER — Telehealth: Payer: Self-pay | Admitting: Internal Medicine

## 2016-01-13 NOTE — Telephone Encounter (Signed)
Pt came in and dropped off a [employer] form to be filled out.. Placed in Dr. Bary Leriche box.. Please advise pt when completed.. Form can be faxed or mailed (envelope attached with stamp).

## 2016-01-13 NOTE — Telephone Encounter (Signed)
placed in red folder

## 2016-01-14 NOTE — Telephone Encounter (Signed)
Form signed and placed in your box.

## 2016-01-16 NOTE — Telephone Encounter (Signed)
Form faxed, copy mailed to patient, & original sent to scan

## 2016-03-02 ENCOUNTER — Other Ambulatory Visit: Payer: Self-pay | Admitting: Nurse Practitioner

## 2016-03-02 DIAGNOSIS — R1011 Right upper quadrant pain: Secondary | ICD-10-CM

## 2016-03-02 DIAGNOSIS — R1013 Epigastric pain: Secondary | ICD-10-CM

## 2016-03-07 ENCOUNTER — Other Ambulatory Visit: Payer: Self-pay | Admitting: Internal Medicine

## 2016-03-11 ENCOUNTER — Ambulatory Visit
Admission: RE | Admit: 2016-03-11 | Discharge: 2016-03-11 | Disposition: A | Discharge status: Home / Self Care | Payer: Medicare Other | Source: Ambulatory Visit | Attending: Nurse Practitioner | Admitting: Nurse Practitioner

## 2016-03-11 ENCOUNTER — Other Ambulatory Visit: Payer: Self-pay | Admitting: Nurse Practitioner

## 2016-03-11 DIAGNOSIS — R1013 Epigastric pain: Secondary | ICD-10-CM | POA: Diagnosis present

## 2016-03-11 DIAGNOSIS — R1011 Right upper quadrant pain: Secondary | ICD-10-CM | POA: Diagnosis not present

## 2016-03-15 ENCOUNTER — Encounter
Admission: RE | Admit: 2016-03-15 | Discharge: 2016-03-15 | Disposition: A | Discharge status: Home / Self Care | Payer: Medicare Other | Source: Ambulatory Visit | Attending: Nurse Practitioner | Admitting: Nurse Practitioner

## 2016-03-15 DIAGNOSIS — R1013 Epigastric pain: Secondary | ICD-10-CM | POA: Diagnosis present

## 2016-03-15 DIAGNOSIS — R1011 Right upper quadrant pain: Secondary | ICD-10-CM | POA: Diagnosis present

## 2016-03-15 MED ORDER — SINCALIDE 5 MCG IJ SOLR
0.0200 ug/kg | Freq: Once | INTRAMUSCULAR | Status: AC
  Administered 2016-03-15: 1.5 ug via INTRAVENOUS

## 2016-03-15 MED ORDER — TECHNETIUM TC 99M MEBROFENIN IV KIT
5.0800 | PACK | Freq: Once | INTRAVENOUS | Status: AC | PRN
  Administered 2016-03-15: 5.08 via INTRAVENOUS

## 2016-05-19 ENCOUNTER — Other Ambulatory Visit: Payer: Self-pay | Admitting: Internal Medicine

## 2016-05-19 DIAGNOSIS — Z1231 Encounter for screening mammogram for malignant neoplasm of breast: Secondary | ICD-10-CM

## 2016-06-02 ENCOUNTER — Ambulatory Visit (INDEPENDENT_AMBULATORY_CARE_PROVIDER_SITE_OTHER): Payer: Medicare Other | Admitting: Internal Medicine

## 2016-06-02 ENCOUNTER — Encounter: Payer: Self-pay | Admitting: Internal Medicine

## 2016-06-02 VITALS — BP 130/60 | HR 62 | Temp 98.2°F | Resp 18 | Ht 59.0 in | Wt 166.4 lb

## 2016-06-02 DIAGNOSIS — L299 Pruritus, unspecified: Secondary | ICD-10-CM | POA: Insufficient documentation

## 2016-06-02 DIAGNOSIS — K219 Gastro-esophageal reflux disease without esophagitis: Secondary | ICD-10-CM | POA: Diagnosis not present

## 2016-06-02 DIAGNOSIS — E669 Obesity, unspecified: Secondary | ICD-10-CM

## 2016-06-02 DIAGNOSIS — I10 Essential (primary) hypertension: Secondary | ICD-10-CM

## 2016-06-02 DIAGNOSIS — E7800 Pure hypercholesterolemia, unspecified: Secondary | ICD-10-CM | POA: Diagnosis not present

## 2016-06-02 DIAGNOSIS — E119 Type 2 diabetes mellitus without complications: Secondary | ICD-10-CM

## 2016-06-02 LAB — HEMOGLOBIN A1C: Hgb A1c MFr Bld: 6.7 % — ABNORMAL HIGH (ref 4.6–6.5)

## 2016-06-02 LAB — HEPATIC FUNCTION PANEL
ALK PHOS: 63 U/L (ref 39–117)
ALT: 13 U/L (ref 0–35)
AST: 19 U/L (ref 0–37)
Albumin: 4 g/dL (ref 3.5–5.2)
BILIRUBIN DIRECT: 0.1 mg/dL (ref 0.0–0.3)
BILIRUBIN TOTAL: 0.6 mg/dL (ref 0.2–1.2)
Total Protein: 6.6 g/dL (ref 6.0–8.3)

## 2016-06-02 LAB — LIPID PANEL
CHOL/HDL RATIO: 3
Cholesterol: 142 mg/dL (ref 0–200)
HDL: 45.4 mg/dL (ref >39.00)
LDL Cholesterol: 67 mg/dL (ref 0–99)
NONHDL: 96.73
TRIGLYCERIDES: 148 mg/dL (ref 0.0–149.0)
VLDL: 29.6 mg/dL (ref 0.0–40.0)

## 2016-06-02 LAB — BASIC METABOLIC PANEL
BUN: 17 mg/dL (ref 6–23)
CHLORIDE: 104 meq/L (ref 96–112)
CO2: 33 meq/L — AB (ref 19–32)
CREATININE: 0.94 mg/dL (ref 0.40–1.20)
Calcium: 9.1 mg/dL (ref 8.4–10.5)
GFR: 60.53 mL/min (ref >60.00)
Glucose, Bld: 111 mg/dL — ABNORMAL HIGH (ref 70–99)
POTASSIUM: 4.3 meq/L (ref 3.5–5.1)
Sodium: 139 mEq/L (ref 135–145)

## 2016-06-02 NOTE — Assessment & Plan Note (Signed)
Blood pressure under good control.  Continue same medication regimen.  Follow pressures.  Follow metabolic panel.   

## 2016-06-02 NOTE — Progress Notes (Signed)
Patient ID: Teresa Wade, female   DOB: 11-Jul-1932, 82 y.o.   MRN: [id number]   Subjective:    Patient ID: Teresa Wade, female    DOB: 06-02-1932, 82 y.o.   MRN: [id number]  HPI  Patient here for a scheduled follow up.  Was evaluated in April by GI for RUQ pain.  [place] was negative for gallbladder disease.  HIDA negative.  Pain resolved.  Also saw cardiology.  Functional study showed no reversible ischemia with EF 74%.  Notes reviewed.  Abdominal pain is better.  Acid reflux controlled.  States if she watches her diet and takes her medication, she does well.  Bowels doing well.  Tries to stay active.  No chest pain.  Breathing stable.  She does report itching right breast - nipple.  No rash.  Intermittent.  No pain.  No breast lump.     Past Medical History  Diagnosis Date  . Hypertension   . Anxiety   . Osteoporosis     s/p Actonel, Reclast (last 2011)  . Emphysema of lung ([place])   . Hypercholesterolemia   . GERD (gastroesophageal reflux disease)   . Fibrocystic disease of breast   . History of thrombocytopenia   . Abnormal chest CT     right hilar cyst  . Psoriasis   . Hyperglycemia   . Osteoarthritis    Past Surgical History  Procedure Laterality Date  . Abdominal hysterectomy  1980  . Breast excisional biopsy  1976    benign   Family History  Problem Relation Age of Onset  . Heart disease Mother   . Mental illness      sibling, suicide  . Colon cancer Sister    Social History   Social History  . Marital Status: Married    Spouse Name: N/A  . Number of Children: 1  . Years of Education: N/A   Social History Main Topics  . Smoking status: Former Research scientist (life sciences)  . Smokeless tobacco: Never Used  . Alcohol Use: No  . Drug Use: No  . Sexual Activity: Not Asked   Other Topics Concern  . None   Social History Narrative    Outpatient Encounter Prescriptions as of 06/02/2016  Medication Sig  . acebutolol (SECTRAL) 200 MG capsule TAKE 2 CAPSULES BY MOUTH EVERY MORNING  & 1 CAPSULE EVERY EVENING  . aspirin 81 MG tablet Take 81 mg by mouth daily.  . calcium carbonate (OS-CAL) 1250 MG chewable tablet Chew 1 tablet by mouth 2 (two) times daily.  Marland Kitchen esomeprazole (NEXIUM) 40 MG capsule TAKE ONE CAPSULE BY MOUTH TWICE A DAY  . losartan (COZAAR) 25 MG tablet TAKE 1 TABLET (25 MG TOTAL) BY MOUTH DAILY.  . magnesium oxide (MAG-OX) 400 MG tablet Take 400 mg by mouth daily.  . nitroGLYCERIN (NITROSTAT) 0.4 MG SL tablet Place 1 tablet (0.4 mg total) under the tongue every 5 (five) minutes as needed for chest pain. May repeat x 1.  If persistent pain, call 911  . Omega-3 Fatty Acids (FISH OIL TRIPLE STRENGTH) 1400 MG CAPS Take 1 tablet by mouth daily.  . sertraline (ZOLOFT) 25 MG tablet TAKE 1 TABLET BY MOUTH DAILY  . simvastatin (ZOCOR) 40 MG tablet TAKE 1 TABLET BY MOUTH AT BEDTIME  . TACLONEX external suspension   . [DISCONTINUED] ranitidine (ZANTAC) 150 MG tablet Take 150 mg by mouth at bedtime.   No facility-administered encounter medications on file as of 06/02/2016.    Review of Systems  Constitutional: Negative  for appetite change and unexpected weight change.  HENT: Negative for congestion and sinus pressure.   Respiratory: Negative for cough, chest tightness and shortness of breath.   Cardiovascular: Negative for chest pain, palpitations and leg swelling.  Gastrointestinal: Positive for abdominal pain (improved. ). Negative for nausea, vomiting and diarrhea.  Genitourinary: Negative for dysuria and difficulty urinating.  Musculoskeletal: Negative for back pain and joint swelling.  Skin: Negative for color change and rash.       Itching - right breast.   Neurological: Negative for dizziness, light-headedness and headaches.  Psychiatric/Behavioral: Negative for dysphoric mood and agitation.       Increased stress with increased work and her husband's health issues.  She is having to do more around the home.  Overall she feels she is handling things relatively  well.  Does not feel she needs anything more at this time.        Objective:     Blood pressure rechecked by me:  128/64  Physical Exam  Constitutional: She appears well-developed and well-nourished. No distress.  HENT:  Nose: Nose normal.  Mouth/Throat: Oropharynx is clear and moist.  Neck: Neck supple. No thyromegaly present.  Cardiovascular: Normal rate and regular rhythm.   Pulmonary/Chest: Breath sounds normal. No respiratory distress. She has no wheezes.  Breast exam reveals no nipple discharge or nipple retraction present.  No rash.  Could not appreciate any distinct nodules or axillary adenopathy.    Abdominal: Soft. Bowel sounds are normal. There is no tenderness.  Musculoskeletal: She exhibits no edema or tenderness.  Lymphadenopathy:    She has no cervical adenopathy.  Skin: No rash noted. No erythema.  Psychiatric: She has a normal mood and affect. Her behavior is normal.    BP 130/60 mmHg  Pulse 62  Temp(Src) 98.2 F (36.8 C) (Oral)  Resp 18  Ht '4\' 11"'$  (1.499 m)  Wt 166 lb 6 oz (75.467 kg)  BMI 33.59 kg/m2  SpO2 96%  LMP 11/09/1979 Wt Readings from Last 3 Encounters:  06/02/16 166 lb 6 oz (75.467 kg)  12/05/15 165 lb 6 oz (75.014 kg)  07/14/15 156 lb (70.761 kg)     Lab Results  Component Value Date   WBC 5.5 12/26/2015   HGB 12.7 12/26/2015   HCT 38.8 12/26/2015   PLT 201.0 12/26/2015   GLUCOSE 109* 12/26/2015   CHOL 150 12/26/2015   TRIG 144.0 12/26/2015   HDL 51.30 12/26/2015   LDLCALC 70 12/26/2015   ALT 13 12/26/2015   AST 20 12/26/2015   NA 138 12/26/2015   K 4.7 12/26/2015   CL 101 12/26/2015   CREATININE 1.00 12/26/2015   BUN 23 12/26/2015   CO2 31 12/26/2015   TSH 1.06 12/26/2015   HGBA1C 6.5 12/26/2015   MICROALBUR 0.8 12/26/2015    Nm Hepato W/eject Fract  03/15/2016  CLINICAL DATA:  Right upper quadrant abdominal pain. EXAM: NUCLEAR MEDICINE HEPATOBILIARY IMAGING WITH GALLBLADDER EF TECHNIQUE: Sequential images of the abdomen  were obtained out to 60 minutes following intravenous administration of radiopharmaceutical. After slow intravenous infusion of 1.5 micrograms Cholecystokinin, gallbladder ejection fraction was determined. RADIOPHARMACEUTICALS:  5.08 mCi Tc-46mCholetec IV COMPARISON:  Ultrasound of March 11, 2016. FINDINGS: Prompt uptake and biliary excretion of activity by the liver is seen. Gallbladder activity is visualized, consistent with patency of cystic duct. Biliary activity passes into small bowel, consistent with patent common bile duct. Calculated gallbladder ejection fraction is 76%. (At 60 min, normal ejection fraction is greater than  40%.) patient reported some discomfort during CCK administration. IMPRESSION: Normal gallbladder ejection fraction is noted after CCK administration. Electronically Signed   By: Marijo Conception, M.D.   On: 03/15/2016 10:39       Assessment & Plan:   Problem List Items Addressed This Visit    Diabetes ([place])    Low carb diet and exercise.  Follow met b and a1c.        Relevant Orders   Hemoglobin M3N   Basic metabolic panel   Essential hypertension    Blood pressure under good control.  Continue same medication regimen.  Follow pressures.  Follow metabolic panel.        GERD (gastroesophageal reflux disease)    On nexium  Followed by GI.  If she takes her medication regularly and watches her diet and avoids eating late, does well.        Hypercholesterolemia - Primary    On simvastatin.  Low cholesterol diet and exercise.  She has had not problems with simvastatin.  Will continue current medication regimen.  Follow lipid panel and liver function tests.        Relevant Orders   Lipid panel   Hepatic function panel   Itching    Itching - nipple - right breast.  No nipple discharge.  No palpable masses.  Will change detergent.  Follow.  Mammogram scheduled - due.  Notify me if persistent or change.        Obesity (BMI 30-39.9)    Diet and exercise.  Follow.             Einar Pheasant, MD

## 2016-06-02 NOTE — Assessment & Plan Note (Signed)
Itching - nipple - right breast.  No nipple discharge.  No palpable masses.  Will change detergent.  Follow.  Mammogram scheduled - due.  Notify me if persistent or change.

## 2016-06-02 NOTE — Assessment & Plan Note (Signed)
On simvastatin.  Low cholesterol diet and exercise.  She has had not problems with simvastatin.  Will continue current medication regimen.  Follow lipid panel and liver function tests.

## 2016-06-02 NOTE — Assessment & Plan Note (Signed)
Diet and exercise.  Follow.  

## 2016-06-02 NOTE — Assessment & Plan Note (Signed)
On nexium  Followed by GI.  If she takes her medication regularly and watches her diet and avoids eating late, does well.

## 2016-06-02 NOTE — Progress Notes (Signed)
Pre-visit discussion using our clinic review tool. No additional management support is needed unless otherwise documented below in the visit note.  

## 2016-06-02 NOTE — Assessment & Plan Note (Signed)
Low carb diet and exercise.  Follow met b and a1c.   

## 2016-06-03 ENCOUNTER — Encounter: Payer: Self-pay | Admitting: *Deleted

## 2016-06-19 ENCOUNTER — Other Ambulatory Visit: Payer: Self-pay | Admitting: Internal Medicine

## 2016-06-20 NOTE — Telephone Encounter (Signed)
rx ok'd for zoloft #90 with one refill.

## 2016-06-28 ENCOUNTER — Ambulatory Visit
Admission: RE | Admit: 2016-06-28 | Discharge: 2016-06-28 | Disposition: A | Discharge status: Home / Self Care | Payer: Medicare Other | Source: Ambulatory Visit | Attending: Internal Medicine | Admitting: Internal Medicine

## 2016-06-28 ENCOUNTER — Other Ambulatory Visit: Payer: Self-pay | Admitting: Internal Medicine

## 2016-06-28 DIAGNOSIS — Z1231 Encounter for screening mammogram for malignant neoplasm of breast: Secondary | ICD-10-CM

## 2016-08-04 ENCOUNTER — Other Ambulatory Visit: Payer: Self-pay | Admitting: Internal Medicine

## 2016-10-02 ENCOUNTER — Other Ambulatory Visit: Payer: Self-pay | Admitting: Internal Medicine

## 2016-12-07 ENCOUNTER — Emergency Department: Payer: Medicare Other

## 2016-12-07 ENCOUNTER — Encounter: Payer: Self-pay | Admitting: *Deleted

## 2016-12-07 ENCOUNTER — Emergency Department
Admission: EM | Admit: 2016-12-07 | Discharge: 2016-12-07 | Disposition: A | Discharge status: Home / Self Care | Payer: Medicare Other | Attending: Emergency Medicine | Admitting: Emergency Medicine

## 2016-12-07 DIAGNOSIS — M546 Pain in thoracic spine: Secondary | ICD-10-CM | POA: Insufficient documentation

## 2016-12-07 DIAGNOSIS — Z7984 Long term (current) use of oral hypoglycemic drugs: Secondary | ICD-10-CM | POA: Insufficient documentation

## 2016-12-07 DIAGNOSIS — E119 Type 2 diabetes mellitus without complications: Secondary | ICD-10-CM | POA: Diagnosis not present

## 2016-12-07 DIAGNOSIS — Z7982 Long term (current) use of aspirin: Secondary | ICD-10-CM | POA: Insufficient documentation

## 2016-12-07 DIAGNOSIS — Z87891 Personal history of nicotine dependence: Secondary | ICD-10-CM | POA: Insufficient documentation

## 2016-12-07 DIAGNOSIS — Z79899 Other long term (current) drug therapy: Secondary | ICD-10-CM | POA: Diagnosis not present

## 2016-12-07 DIAGNOSIS — R0789 Other chest pain: Secondary | ICD-10-CM | POA: Diagnosis present

## 2016-12-07 DIAGNOSIS — I10 Essential (primary) hypertension: Secondary | ICD-10-CM | POA: Diagnosis not present

## 2016-12-07 LAB — CBC
HEMATOCRIT: 38.9 % (ref 35.0–47.0)
HEMOGLOBIN: 13.2 g/dL (ref 12.0–16.0)
MCH: 30.1 pg (ref 26.0–34.0)
MCHC: 34 g/dL (ref 32.0–36.0)
MCV: 88.6 fL (ref 80.0–100.0)
Platelets: 205 10*3/uL (ref 150–440)
RBC: 4.39 MIL/uL (ref 3.80–5.20)
RDW: 13.8 % (ref 11.5–14.5)
WBC: 8 10*3/uL (ref 3.6–11.0)

## 2016-12-07 LAB — BASIC METABOLIC PANEL
ANION GAP: 7 (ref 5–15)
BUN: 23 mg/dL — AB (ref 6–20)
CHLORIDE: 103 mmol/L (ref 101–111)
CO2: 28 mmol/L (ref 22–32)
Calcium: 9.4 mg/dL (ref 8.9–10.3)
Creatinine, Ser: 1.1 mg/dL — ABNORMAL HIGH (ref 0.44–1.00)
GFR calc Af Amer: 53 mL/min — ABNORMAL LOW (ref >60)
GFR, EST NON AFRICAN AMERICAN: 45 mL/min — AB (ref >60)
GLUCOSE: 141 mg/dL — AB (ref 65–99)
POTASSIUM: 4.5 mmol/L (ref 3.5–5.1)
Sodium: 138 mmol/L (ref 135–145)

## 2016-12-07 LAB — TROPONIN I: Troponin I: <0.03 ng/mL (ref <0.03)

## 2016-12-07 MED ORDER — IOPAMIDOL (ISOVUE-370) INJECTION 76%
60.0000 mL | Freq: Once | INTRAVENOUS | Status: AC | PRN
  Administered 2016-12-07: 60 mL via INTRAVENOUS

## 2016-12-07 NOTE — ED Provider Notes (Signed)
[facility] Emergency Department Provider Note  ____________________________________________   First MD Initiated Contact with Patient 12/07/16 [phone]     (approximate)  I have reviewed the triage vital signs and the nursing notes.   HISTORY  Chief Complaint Chest Pain    HPI Teresa Wade is a 83 y.o. female with a past medical history as listed below who presents for evaluation of acute onset pain in her right shoulder blade that radiates through to her chest.  She states that she has not had pain like this in the past.  She rates it as moderate in intensity but concerning to her.  She took some ibuprofen which did not help and then took 2 nitroglycerin which also had no effect.  Nothing in particular makes it better nor worse.  Moving her arm and range of motion of her torso and right upper extremity have no effect on it.  She denies shortness of breath, fever/chills, abdominal pain, nausea, vomiting, dysuria.  She sees Dr. Ubaldo Glassing for cardiology and had some sort of cardiac stress or functional testing over the last 1-2 years which was reassuring.  Past Medical History:  Diagnosis Date  . Abnormal chest CT    right hilar cyst  . Anxiety   . Emphysema of lung ([place])   . Fibrocystic disease of breast   . GERD (gastroesophageal reflux disease)   . History of thrombocytopenia   . Hypercholesterolemia   . Hyperglycemia   . Hypertension   . Osteoarthritis   . Osteoporosis    s/p Actonel, Reclast (last 2011)  . Psoriasis     Patient Active Problem List   Diagnosis Date Noted  . Itching 06/02/2016  . Hoarseness 07/16/2015  . Health care maintenance 06/29/2015  . Obesity (BMI 30-39.9) 12/01/2014  . Thumb pain 07/28/2014  . Hyperglycemia 03/23/2014  . History of colonic polyps 12/02/2013  . GERD (gastroesophageal reflux disease) 11/08/2012  . Osteoporosis 11/08/2012  . Diabetes ([place]) 11/08/2012  . Hypercholesterolemia 06/22/2010  . Essential  hypertension 06/22/2010  . ALLERGIC REACTION 06/22/2010    Past Surgical History:  Procedure Laterality Date  . ABDOMINAL HYSTERECTOMY  1980  . BREAST EXCISIONAL BIOPSY  1976   benign    Prior to Admission medications   Medication Sig Start Date End Date Taking? Authorizing Provider  acebutolol (SECTRAL) 200 MG capsule TAKE 2 CAPSULES BY MOUTH EVERY MORNING & 1 CAPSULE EVERY EVENING 10/04/16   Einar Pheasant, MD  aspirin 81 MG tablet Take 81 mg by mouth daily.    Historical Provider, MD  calcium carbonate (OS-CAL) 1250 MG chewable tablet Chew 1 tablet by mouth 2 (two) times daily.    Historical Provider, MD  esomeprazole (NEXIUM) 40 MG capsule TAKE ONE CAPSULE BY MOUTH TWICE A DAY 03/08/16   Einar Pheasant, MD  losartan (COZAAR) 25 MG tablet TAKE 1 TABLET (25 MG TOTAL) BY MOUTH DAILY. 01/12/16   Einar Pheasant, MD  magnesium oxide (MAG-OX) 400 MG tablet Take 400 mg by mouth daily.    Historical Provider, MD  nitroGLYCERIN (NITROSTAT) 0.4 MG SL tablet Place 1 tablet (0.4 mg total) under the tongue every 5 (five) minutes as needed for chest pain. May repeat x 1.  If persistent pain, call 911 03/11/15   Einar Pheasant, MD  Omega-3 Fatty Acids (FISH OIL TRIPLE STRENGTH) 1400 MG CAPS Take 1 tablet by mouth daily.    Historical Provider, MD  sertraline (ZOLOFT) 25 MG tablet TAKE 1 TABLET BY MOUTH DAILY 06/20/16  Einar Pheasant, MD  simvastatin (ZOCOR) 40 MG tablet TAKE 1 TABLET BY MOUTH AT BEDTIME 08/04/16   Einar Pheasant, MD  TACLONEX external suspension  11/05/14   Historical Provider, MD    Allergies Clarithromycin; Lodine [etodolac]; Minocycline; Naproxen sodium; Rofecoxib; Vibramycin [doxycycline calcium]; and Mobic [meloxicam]  Family History  Problem Relation Age of Onset  . Heart disease Mother   . Mental illness      sibling, suicide  . Colon cancer Sister     Social History Social History  Substance Use Topics  . Smoking status: Former Research scientist (life sciences)  . Smokeless tobacco: Never Used    . Alcohol use No    Review of Systems Constitutional: No fever/chills Eyes: No visual changes. ENT: No sore throat. Cardiovascular: chest and back pain on the right side Respiratory: Denies shortness of breath. Gastrointestinal: No abdominal pain.  No nausea, no vomiting.  No diarrhea.  No constipation. Genitourinary: Negative for dysuria. Musculoskeletal: right shoulder/back pain going through to chest Skin: Negative for rash. Neurological: Negative for headaches, focal weakness or numbness.  10-point ROS otherwise negative.  ____________________________________________   PHYSICAL EXAM:  VITAL SIGNS: ED Triage Vitals  Enc Vitals Group     BP 12/07/16 0158 (!) 183/80     Pulse Rate 12/07/16 0158 68     Resp 12/07/16 0158 20     Temp 12/07/16 0158 97.8 F (36.6 C)     Temp Source 12/07/16 0158 Oral     SpO2 12/07/16 0158 99 %     Weight 12/07/16 0158 164 lb (74.4 kg)     Height 12/07/16 0158 5' (1.524 m)     Head Circumference --      Peak Flow --      Pain Score 12/07/16 0159 7     Pain Loc --      Pain Edu? --      Excl. in [place]? --     Constitutional: Alert and oriented. Well appearing and in no acute distress. Eyes: Conjunctivae are normal. PERRL. EOMI. Head: Atraumatic. Nose: No congestion/rhinnorhea. Mouth/Throat: Mucous membranes are moist.  Oropharynx non-erythematous. Neck: No stridor.  No meningeal signs.   Cardiovascular: Normal rate, regular rhythm. Good peripheral circulation. Grossly normal heart sounds. No reproducible chest wall tenderness Respiratory: Normal respiratory effort.  No retractions. Lungs CTAB. Gastrointestinal: Soft and nontender. No distention.  Musculoskeletal: No lower extremity tenderness nor edema. No gross deformities of extremities. No reproducible pain or tenderness with range of motion of the right arm or palpation of the right shoulder blade Neurologic:  Normal speech and language. No gross focal neurologic deficits are  appreciated.  Skin:  Skin is warm, dry and intact. No rash noted. Psychiatric: Mood and affect are normal. Speech and behavior are normal.  ____________________________________________   LABS (all labs ordered are listed, but only abnormal results are displayed)  Labs Reviewed  BASIC METABOLIC PANEL - Abnormal; Notable for the following:       Result Value   Glucose, Bld 141 (*)    BUN 23 (*)    Creatinine, Ser 1.10 (*)    GFR calc non Af Amer 45 (*)    GFR calc Af Amer 53 (*)    All other components within normal limits  CBC  TROPONIN I  TROPONIN I   ____________________________________________  EKG  ED ECG REPORT I, Emilie Carp, the attending physician, personally viewed and interpreted this ECG.  Date: 12/07/2016 EKG Time: 2:04 AM Rate: 65 Rhythm: sinus rhythm with first-degree  AV block QRS Axis: normal Intervals: First-degree AV block with a PR interval of 220 ms ST/T Wave abnormalities: normal Conduction Disturbances: none Narrative Interpretation: unremarkable  ____________________________________________  RADIOLOGY   Dg Chest 2 View  Result Date: 12/07/2016 CLINICAL DATA:  Chest pain EXAM: CHEST  2 VIEW COMPARISON:  chest CT 03/23/2011 FINDINGS: Cardiomediastinal contours are normal with mild atherosclerotic calcification in the aortic arch. The lungs are clear. No pleural effusion or pneumothorax. IMPRESSION: Aortic atherosclerosis without focal airspace disease or pulmonary edema. Electronically Signed   By: Ulyses Jarred M.D.   On: 12/07/2016 02:48   Ct Angio Chest Pe W Or Wo Contrast  Result Date: 12/07/2016 CLINICAL DATA:  Chest pressure. No relief with nitroglycerin. Back pain and right shoulder pain. EXAM: CT ANGIOGRAPHY CHEST WITH CONTRAST TECHNIQUE: Multidetector CT imaging of the chest was performed using the standard protocol during bolus administration of intravenous contrast. Multiplanar CT image reconstructions and MIPs were obtained to evaluate  the vascular anatomy. CONTRAST:  60 mL Isovue 370 COMPARISON:  03/23/2011 FINDINGS: Cardiovascular: Satisfactory opacification of the pulmonary arteries to the segmental level. No evidence of pulmonary embolism. Normal heart size. No pericardial effusion. Normal caliber aorta. No aortic dissection. Scattered aortic calcifications. Mediastinum/Nodes: No significant lymphadenopathy. Visualize lymph nodes are not pathologically enlarged. Esophagus is decompressed. 1.2 cm nodule in the left thyroid gland is unchanged since prior studies. Small cystic structure adjacent to the left atrium mid inferior to the pulmonary veins, measuring 1.5 cm diameter. No change. This is likely a pericardial or duplication cyst. Lungs/Pleura: No focal airspace disease or consolidation in the lungs. No pleural effusions. No pneumothorax. Airways are patent. Upper Abdomen: No acute abnormality. Musculoskeletal: Mild degenerative changes in the spine. No destructive bone lesions. Review of the MIP images confirms the above findings. IMPRESSION: No evidence of significant pulmonary embolus. No evidence of active pulmonary disease. Stable appearance since previous study. Electronically Signed   By: Lucienne Capers M.D.   On: 12/07/2016 04:58    ____________________________________________   PROCEDURES  Procedure(s) performed:   Procedures   Critical Care performed: No ____________________________________________   INITIAL IMPRESSION / ASSESSMENT AND PLAN / ED COURSE  Pertinent labs & imaging results that were available during my care of the patient were reviewed by me and considered in my medical decision making (see chart for details).  Differential diagnosis is very broad and includes but is not exclusive to musculoskeletal back pain, renal colic, urinary tract infection, pyelonephritis, intra-abdominal causes of back pain, aortic aneurysm or dissection, thoracic disc disease, acute coronary syndrome, aortic  dissection, pulmonary embolism, cardiac tamponade, community-acquired pneumonia, pericarditis, musculoskeletal chest wall pain, etc.  However, given her relatively reassuring and relatively recent workup by her cardiologist and think that ACS is less likely and though dissection is possible, I believe that imaging for ruling out pulmonary embolism would be the highest yield and most probable.  Initial lab workup and EKG are reassuring.   Clinical Course as of Dec 07 709  [place] Dec 07, 2016  [id number] The patient states that she feels better and is reassured by her unremarkable workup.  I explained the results of her CT scan as well as the 2 negative troponins.  She is comfortable with the plan for outpatient follow-up.  I find no evidence of acute or emergent medical condition at this time, she is in no acute distress, she has stable vital signs, and she is appropriate for follow-up.I gave my usual and customary return precautions.   [CF]  Clinical Course User Index [CF] Hinda Kehr, MD    ____________________________________________  FINAL CLINICAL IMPRESSION(S) / ED DIAGNOSES  Final diagnoses:  Atypical chest pain  Right-sided thoracic back pain, unspecified chronicity     MEDICATIONS GIVEN DURING THIS VISIT:  Medications  iopamidol (ISOVUE-370) 76 % injection 60 mL (60 mLs Intravenous Contrast Given 12/07/16 0424)     NEW OUTPATIENT MEDICATIONS STARTED DURING THIS VISIT:  New Prescriptions   No medications on file    Modified Medications   No medications on file    Discontinued Medications   No medications on file     Note:  This document was prepared using Dragon voice recognition software and may include unintentional dictation errors.    Hinda Kehr, MD 12/07/16 [phone]

## 2016-12-07 NOTE — Discharge Instructions (Signed)

## 2016-12-07 NOTE — ED Triage Notes (Signed)
Pt ambulatory to triage. Pt has pressure in chest and took 2 ntg  Without relief.  Pt states pain between shoulder blades. Pt alert

## 2016-12-08 ENCOUNTER — Encounter: Payer: Self-pay | Admitting: Internal Medicine

## 2016-12-08 ENCOUNTER — Ambulatory Visit (INDEPENDENT_AMBULATORY_CARE_PROVIDER_SITE_OTHER): Payer: Medicare Other | Admitting: Internal Medicine

## 2016-12-08 VITALS — BP 120/68 | HR 69 | Ht 58.25 in | Wt 160.8 lb

## 2016-12-08 DIAGNOSIS — E7800 Pure hypercholesterolemia, unspecified: Secondary | ICD-10-CM

## 2016-12-08 DIAGNOSIS — Z0000 Encounter for general adult medical examination without abnormal findings: Secondary | ICD-10-CM | POA: Diagnosis not present

## 2016-12-08 DIAGNOSIS — K219 Gastro-esophageal reflux disease without esophagitis: Secondary | ICD-10-CM

## 2016-12-08 DIAGNOSIS — M5489 Other dorsalgia: Secondary | ICD-10-CM

## 2016-12-08 DIAGNOSIS — E119 Type 2 diabetes mellitus without complications: Secondary | ICD-10-CM

## 2016-12-08 DIAGNOSIS — Z860100 Personal history of colon polyps, unspecified: Secondary | ICD-10-CM

## 2016-12-08 DIAGNOSIS — R110 Nausea: Secondary | ICD-10-CM | POA: Diagnosis not present

## 2016-12-08 DIAGNOSIS — M549 Dorsalgia, unspecified: Secondary | ICD-10-CM | POA: Diagnosis not present

## 2016-12-08 DIAGNOSIS — R739 Hyperglycemia, unspecified: Secondary | ICD-10-CM | POA: Diagnosis not present

## 2016-12-08 DIAGNOSIS — I10 Essential (primary) hypertension: Secondary | ICD-10-CM

## 2016-12-08 DIAGNOSIS — E669 Obesity, unspecified: Secondary | ICD-10-CM

## 2016-12-08 LAB — HEPATIC FUNCTION PANEL
ALBUMIN: 4.2 g/dL (ref 3.5–5.2)
ALK PHOS: 70 U/L (ref 39–117)
ALT: 12 U/L (ref 0–35)
AST: 19 U/L (ref 0–37)
Bilirubin, Direct: 0.1 mg/dL (ref 0.0–0.3)
TOTAL PROTEIN: 6.8 g/dL (ref 6.0–8.3)
Total Bilirubin: 0.7 mg/dL (ref 0.2–1.2)

## 2016-12-08 LAB — LIPID PANEL
CHOLESTEROL: 171 mg/dL (ref 0–200)
HDL: 54.7 mg/dL (ref >39.00)
LDL Cholesterol: 78 mg/dL (ref 0–99)
NonHDL: 116.49
Total CHOL/HDL Ratio: 3
Triglycerides: 193 mg/dL — ABNORMAL HIGH (ref 0.0–149.0)
VLDL: 38.6 mg/dL (ref 0.0–40.0)

## 2016-12-08 LAB — BASIC METABOLIC PANEL
BUN: 21 mg/dL (ref 6–23)
CALCIUM: 9.7 mg/dL (ref 8.4–10.5)
CO2: 31 mEq/L (ref 19–32)
CREATININE: 1.01 mg/dL (ref 0.40–1.20)
Chloride: 102 mEq/L (ref 96–112)
GFR: 55.65 mL/min — AB (ref >60.00)
Glucose, Bld: 109 mg/dL — ABNORMAL HIGH (ref 70–99)
Potassium: 5 mEq/L (ref 3.5–5.1)
Sodium: 140 mEq/L (ref 135–145)

## 2016-12-08 LAB — HEMOGLOBIN A1C: Hgb A1c MFr Bld: 6.6 % — ABNORMAL HIGH (ref 4.6–6.5)

## 2016-12-08 LAB — AMYLASE: AMYLASE: 40 U/L (ref 27–131)

## 2016-12-08 LAB — LIPASE: LIPASE: 14 U/L (ref 11.0–59.0)

## 2016-12-08 NOTE — Progress Notes (Signed)
Patient ID: Teresa Wade, female   DOB: 1932-07-05, 83 y.o.   MRN: [id number]   Subjective:    Patient ID: Teresa Wade, female    DOB: 1932/02/28, 83 y.o.   MRN: [id number]  HPI  Patient here for her physical exam.  Was seen in the ED 12/07/16 with right shoulder blade pain that radiated through to her chest.  ER notes reviewed.  Had CT that was negative for PE.  Two negative troponins.  Is followed by Dr Ubaldo Glassing.  Last evaluated 09/02/16.   At that visit, felt stable and felt no further cardiac w/up warranted.  While in ER, felt better.  States since ER visit, has noticed minimal discomfort beneath her right shoulder.  Not worsened by movement.  No pain with deep breathing.  Breathing stable.   No increased cough or congestion.  Does report some intermittent nausea over the last two weeks.  Has been eating.  No vomiting.  No abdominal pain or cramping.  Question of some acid.  Taking nexium bid.  Takes TUMS occasionally.  Bowels stable.    Past Medical History:  Diagnosis Date  . Abnormal chest CT    right hilar cyst  . Anxiety   . Emphysema of lung ([place])   . Fibrocystic disease of breast   . GERD (gastroesophageal reflux disease)   . History of thrombocytopenia   . Hypercholesterolemia   . Hyperglycemia   . Hypertension   . Osteoarthritis   . Osteoporosis    s/p Actonel, Reclast (last 2011)  . Psoriasis    Past Surgical History:  Procedure Laterality Date  . ABDOMINAL HYSTERECTOMY  1980  . BREAST EXCISIONAL BIOPSY  1976   benign   Family History  Problem Relation Age of Onset  . Heart disease Mother   . Mental illness      sibling, suicide  . Colon cancer Sister    Social History   Social History  . Marital status: Married    Spouse name: N/A  . Number of children: 1  . Years of education: N/A   Social History Main Topics  . Smoking status: Former Research scientist (life sciences)  . Smokeless tobacco: Never Used  . Alcohol use No  . Drug use: No  . Sexual activity: Not Asked   Other  Topics Concern  . None   Social History Narrative  . None    Outpatient Encounter Prescriptions as of 12/08/2016  Medication Sig  . acebutolol (SECTRAL) 200 MG capsule TAKE 2 CAPSULES BY MOUTH EVERY MORNING & 1 CAPSULE EVERY EVENING  . aspirin 81 MG tablet Take 81 mg by mouth daily.  . calcium carbonate (OS-CAL) 1250 MG chewable tablet Chew 1 tablet by mouth 2 (two) times daily.  Marland Kitchen esomeprazole (NEXIUM) 40 MG capsule TAKE ONE CAPSULE BY MOUTH TWICE A DAY  . losartan (COZAAR) 25 MG tablet TAKE 1 TABLET (25 MG TOTAL) BY MOUTH DAILY.  . magnesium oxide (MAG-OX) 400 MG tablet Take 400 mg by mouth daily.  . nitroGLYCERIN (NITROSTAT) 0.4 MG SL tablet Place 1 tablet (0.4 mg total) under the tongue every 5 (five) minutes as needed for chest pain. May repeat x 1.  If persistent pain, call 911  . Omega-3 Fatty Acids (FISH OIL TRIPLE STRENGTH) 1400 MG CAPS Take 1 tablet by mouth daily.  . sertraline (ZOLOFT) 25 MG tablet TAKE 1 TABLET BY MOUTH DAILY  . simvastatin (ZOCOR) 40 MG tablet TAKE 1 TABLET BY MOUTH AT BEDTIME  . TACLONEX external suspension  No facility-administered encounter medications on file as of 12/08/2016.     Review of Systems  Constitutional: Negative for appetite change and unexpected weight change.  HENT: Negative for congestion and sinus pressure.   Eyes: Negative for pain and visual disturbance.  Respiratory: Negative for cough, chest tightness and shortness of breath.   Cardiovascular: Negative for palpitations and leg swelling.  Gastrointestinal: Positive for nausea. Negative for abdominal pain, diarrhea and vomiting.  Genitourinary: Negative for difficulty urinating and dysuria.  Musculoskeletal: Negative for joint swelling.       Pain beneath right shoulder blade.    Skin: Negative for color change and rash.  Neurological: Negative for dizziness, light-headedness and headaches.  Hematological: Negative for adenopathy. Does not bruise/bleed easily.    Psychiatric/Behavioral: Negative for agitation and dysphoric mood.       Objective:    Physical Exam  Constitutional: She is oriented to person, place, and time. She appears well-developed and well-nourished. No distress.  HENT:  Nose: Nose normal.  Mouth/Throat: Oropharynx is clear and moist.  Eyes: Right eye exhibits no discharge. Left eye exhibits no discharge. No scleral icterus.  Neck: Neck supple. No thyromegaly present.  Cardiovascular: Normal rate and regular rhythm.   Pulmonary/Chest: Breath sounds normal. No accessory muscle usage. No tachypnea. No respiratory distress. She has no decreased breath sounds. She has no wheezes. She has no rhonchi. Right breast exhibits no inverted nipple, no mass, no nipple discharge and no tenderness (no axillary adenopathy). Left breast exhibits no inverted nipple, no mass, no nipple discharge and no tenderness (no axilarry adenopathy).  Abdominal: Soft. Bowel sounds are normal. There is no tenderness.  Musculoskeletal: She exhibits no edema or tenderness.  No pain to palpation over her back/shoulder blade.  No pain with movement.  No reproducible pain on exam.   Lymphadenopathy:    She has no cervical adenopathy.  Neurological: She is alert and oriented to person, place, and time.  Skin: Skin is warm. No rash noted. No erythema.  Psychiatric: She has a normal mood and affect. Her behavior is normal.    BP 120/68   Pulse 69   Ht 4' 10.25" (1.48 m)   Wt 160 lb 12.8 oz (72.9 kg)   LMP 11/09/1979   SpO2 98%   BMI 33.32 kg/m  Wt Readings from Last 3 Encounters:  12/08/16 160 lb 12.8 oz (72.9 kg)  12/07/16 164 lb (74.4 kg)  06/02/16 166 lb 6 oz (75.5 kg)     Lab Results  Component Value Date   WBC 8.0 12/07/2016   HGB 13.2 12/07/2016   HCT 38.9 12/07/2016   PLT 205 12/07/2016   GLUCOSE 109 (H) 12/08/2016   CHOL 171 12/08/2016   TRIG 193.0 (H) 12/08/2016   HDL 54.70 12/08/2016   LDLCALC 78 12/08/2016   ALT 12 12/08/2016   AST  19 12/08/2016   NA 140 12/08/2016   K 5.0 12/08/2016   CL 102 12/08/2016   CREATININE 1.01 12/08/2016   BUN 21 12/08/2016   CO2 31 12/08/2016   TSH 1.06 12/26/2015   HGBA1C 6.6 (H) 12/08/2016   MICROALBUR 0.8 12/26/2015    Dg Chest 2 View  Result Date: 12/07/2016 CLINICAL DATA:  Chest pain EXAM: CHEST  2 VIEW COMPARISON:  chest CT 03/23/2011 FINDINGS: Cardiomediastinal contours are normal with mild atherosclerotic calcification in the aortic arch. The lungs are clear. No pleural effusion or pneumothorax. IMPRESSION: Aortic atherosclerosis without focal airspace disease or pulmonary edema. Electronically Signed   By:  Ulyses Jarred M.D.   On: 12/07/2016 02:48   Ct Angio Chest Pe W Or Wo Contrast  Result Date: 12/07/2016 CLINICAL DATA:  Chest pressure. No relief with nitroglycerin. Back pain and right shoulder pain. EXAM: CT ANGIOGRAPHY CHEST WITH CONTRAST TECHNIQUE: Multidetector CT imaging of the chest was performed using the standard protocol during bolus administration of intravenous contrast. Multiplanar CT image reconstructions and MIPs were obtained to evaluate the vascular anatomy. CONTRAST:  60 mL Isovue 370 COMPARISON:  03/23/2011 FINDINGS: Cardiovascular: Satisfactory opacification of the pulmonary arteries to the segmental level. No evidence of pulmonary embolism. Normal heart size. No pericardial effusion. Normal caliber aorta. No aortic dissection. Scattered aortic calcifications. Mediastinum/Nodes: No significant lymphadenopathy. Visualize lymph nodes are not pathologically enlarged. Esophagus is decompressed. 1.2 cm nodule in the left thyroid gland is unchanged since prior studies. Small cystic structure adjacent to the left atrium mid inferior to the pulmonary veins, measuring 1.5 cm diameter. No change. This is likely a pericardial or duplication cyst. Lungs/Pleura: No focal airspace disease or consolidation in the lungs. No pleural effusions. No pneumothorax. Airways are patent. Upper  Abdomen: No acute abnormality. Musculoskeletal: Mild degenerative changes in the spine. No destructive bone lesions. Review of the MIP images confirms the above findings. IMPRESSION: No evidence of significant pulmonary embolus. No evidence of active pulmonary disease. Stable appearance since previous study. Electronically Signed   By: Lucienne Capers M.D.   On: 12/07/2016 04:58       Assessment & Plan:   Problem List Items Addressed This Visit    Back pain    Had the pain - beneath right shoulder blade with some documented chest discomfort. ER visit and w/up as outlined.  Improved.  Minimal intermittent back pain as outlined.  Not reproducible on exam.  No pain with movement or deep breathing.  ER recommended cardiology f/u.  Discussed with her today.  Have cardiology evaluate to confirm if any further cardiac testing warranted.  Check liver function tests, amylase and lipase. Consider abdominal ultrasound.  Continue nexium.       Relevant Orders   Hepatic function panel (Completed)   Amylase (Completed)   Lipase (Completed)   Ambulatory referral to Cardiology   Diabetes ([place])    Low carb diet and exercise.  Follow met b and a1c.       Relevant Orders   Hemoglobin A1c (Completed)   Essential hypertension    Blood pressure under good control.  Continue same medication regimen.  Follow pressures.  Follow metabolic panel.        GERD (gastroesophageal reflux disease)    Has been followed by GI.  Nausea as outlined.  Continue nexium bid.  Takes TUMS prn.  Helps.  Consider abdominal ultrasound.  Check labs as outlined.        Health care maintenance    Physical today 12/08/16.  Mammogram 06/28/16 - Birads I.  Colonoscopy 2013.  No f/u warranted.        History of colonic polyps    Colonoscopy 07/25/12 as outlined.  No repeat colonoscopy needed.        Hypercholesterolemia    On simvastatin.  Low cholesterol diet and exercise.  Follow lipid panel and liver function tests.         Relevant Orders   Lipid panel (Completed)   Basic metabolic panel (Completed)   Hyperglycemia - Primary    Low carb diet and exercise.  Follow met b and a1c.  Nausea    Persistent intermittent nausea over the last two weeks.  Taking nexium bid. No abdominal pain.  Eating.  No vomiting.  Continue nexium.  TUMS has helped.  Check amylase, lipase and liver function tests.  Consider abdominal ultrasound.  Discussed previous GI w/up.  Pursue cardiac evaluation as outlined.        Relevant Orders   Hepatic function panel (Completed)   Amylase (Completed)   Lipase (Completed)   Ambulatory referral to Cardiology   Obesity (BMI 30-39.9)    Diet and exercise.         Other Visit Diagnoses    Routine general medical examination at a health care facility           Einar Pheasant, MD

## 2016-12-09 ENCOUNTER — Encounter: Payer: Self-pay | Admitting: Internal Medicine

## 2016-12-09 NOTE — Assessment & Plan Note (Signed)
Colonoscopy 07/25/12 - as outlined.  No repeat colonoscopy needed.  

## 2016-12-09 NOTE — Assessment & Plan Note (Signed)
Low carb diet and exercise.  Follow met b and a1c.  

## 2016-12-09 NOTE — Assessment & Plan Note (Signed)
Blood pressure under good control.  Continue same medication regimen.  Follow pressures.  Follow metabolic panel.   

## 2016-12-09 NOTE — Assessment & Plan Note (Signed)
Persistent intermittent nausea over the last two weeks.  Taking nexium bid. No abdominal pain.  Eating.  No vomiting.  Continue nexium.  TUMS has helped.  Check amylase, lipase and liver function tests.  Consider abdominal ultrasound.  Discussed previous GI w/up.  Pursue cardiac evaluation as outlined.

## 2016-12-09 NOTE — Assessment & Plan Note (Signed)
Had the pain - beneath right shoulder blade with some documented chest discomfort. ER visit and w/up as outlined.  Improved.  Minimal intermittent back pain as outlined.  Not reproducible on exam.  No pain with movement or deep breathing.  ER recommended cardiology f/u.  Discussed with her today.  Have cardiology evaluate to confirm if any further cardiac testing warranted.  Check liver function tests, amylase and lipase. Consider abdominal ultrasound.  Continue nexium.

## 2016-12-09 NOTE — Assessment & Plan Note (Signed)
On simvastatin.  Low cholesterol diet and exercise.  Follow lipid panel and liver function tests.   

## 2016-12-09 NOTE — Assessment & Plan Note (Signed)
Low carb diet and exercise.  Follow met b and a1c.   

## 2016-12-09 NOTE — Assessment & Plan Note (Signed)
Diet and exercise.   

## 2016-12-09 NOTE — Assessment & Plan Note (Signed)
Has been followed by GI.  Nausea as outlined.  Continue nexium bid.  Takes TUMS prn.  Helps.  Consider abdominal ultrasound.  Check labs as outlined.

## 2016-12-09 NOTE — Assessment & Plan Note (Signed)
Physical today 12/08/16.  Mammogram 06/28/16 - Birads I.  Colonoscopy 2013.  No f/u warranted.

## 2016-12-20 ENCOUNTER — Encounter (INDEPENDENT_AMBULATORY_CARE_PROVIDER_SITE_OTHER): Payer: Medicare Other | Admitting: Vascular Surgery

## 2016-12-23 ENCOUNTER — Encounter: Payer: Self-pay | Admitting: Emergency Medicine

## 2016-12-23 ENCOUNTER — Telehealth: Payer: Self-pay | Admitting: *Deleted

## 2016-12-23 ENCOUNTER — Emergency Department
Admission: EM | Admit: 2016-12-23 | Discharge: 2016-12-23 | Disposition: A | Discharge status: Home / Self Care | Payer: Medicare Other | Attending: Emergency Medicine | Admitting: Emergency Medicine

## 2016-12-23 ENCOUNTER — Emergency Department: Payer: Medicare Other

## 2016-12-23 DIAGNOSIS — I10 Essential (primary) hypertension: Secondary | ICD-10-CM | POA: Diagnosis not present

## 2016-12-23 DIAGNOSIS — E119 Type 2 diabetes mellitus without complications: Secondary | ICD-10-CM | POA: Insufficient documentation

## 2016-12-23 DIAGNOSIS — R1084 Generalized abdominal pain: Secondary | ICD-10-CM

## 2016-12-23 DIAGNOSIS — Z87891 Personal history of nicotine dependence: Secondary | ICD-10-CM | POA: Insufficient documentation

## 2016-12-23 DIAGNOSIS — R1013 Epigastric pain: Secondary | ICD-10-CM

## 2016-12-23 DIAGNOSIS — Z79899 Other long term (current) drug therapy: Secondary | ICD-10-CM | POA: Diagnosis not present

## 2016-12-23 DIAGNOSIS — N390 Urinary tract infection, site not specified: Secondary | ICD-10-CM | POA: Diagnosis not present

## 2016-12-23 DIAGNOSIS — Z7982 Long term (current) use of aspirin: Secondary | ICD-10-CM | POA: Diagnosis not present

## 2016-12-23 LAB — COMPREHENSIVE METABOLIC PANEL
ALT: 13 U/L — ABNORMAL LOW (ref 14–54)
ANION GAP: 11 (ref 5–15)
AST: 22 U/L (ref 15–41)
Albumin: 3.8 g/dL (ref 3.5–5.0)
Alkaline Phosphatase: 63 U/L (ref 38–126)
BILIRUBIN TOTAL: 0.8 mg/dL (ref 0.3–1.2)
BUN: 23 mg/dL — ABNORMAL HIGH (ref 6–20)
CO2: 24 mmol/L (ref 22–32)
Calcium: 9 mg/dL (ref 8.9–10.3)
Chloride: 99 mmol/L — ABNORMAL LOW (ref 101–111)
Creatinine, Ser: 1.03 mg/dL — ABNORMAL HIGH (ref 0.44–1.00)
GFR, EST AFRICAN AMERICAN: 57 mL/min — AB (ref >60)
GFR, EST NON AFRICAN AMERICAN: 49 mL/min — AB (ref >60)
Glucose, Bld: 132 mg/dL — ABNORMAL HIGH (ref 65–99)
POTASSIUM: 4.2 mmol/L (ref 3.5–5.1)
Sodium: 134 mmol/L — ABNORMAL LOW (ref 135–145)
TOTAL PROTEIN: 7.2 g/dL (ref 6.5–8.1)

## 2016-12-23 LAB — URINALYSIS, COMPLETE (UACMP) WITH MICROSCOPIC
BACTERIA UA: NONE SEEN
Bilirubin Urine: NEGATIVE
Glucose, UA: NEGATIVE mg/dL
Hgb urine dipstick: NEGATIVE
Ketones, ur: NEGATIVE mg/dL
Nitrite: NEGATIVE
PROTEIN: NEGATIVE mg/dL
Specific Gravity, Urine: 1.02 (ref 1.005–1.030)
pH: 6 (ref 5.0–8.0)

## 2016-12-23 LAB — LIPASE, BLOOD: Lipase: 22 U/L (ref 11–51)

## 2016-12-23 LAB — CBC
HEMATOCRIT: 37.2 % (ref 35.0–47.0)
HEMOGLOBIN: 12.6 g/dL (ref 12.0–16.0)
MCH: 30 pg (ref 26.0–34.0)
MCHC: 33.9 g/dL (ref 32.0–36.0)
MCV: 88.4 fL (ref 80.0–100.0)
Platelets: 203 10*3/uL (ref 150–440)
RBC: 4.21 MIL/uL (ref 3.80–5.20)
RDW: 13.5 % (ref 11.5–14.5)
WBC: 7.1 10*3/uL (ref 3.6–11.0)

## 2016-12-23 MED ORDER — CEPHALEXIN 500 MG PO CAPS
500.0000 mg | ORAL_CAPSULE | Freq: Once | ORAL | Status: AC
  Administered 2016-12-23: 500 mg via ORAL
  Filled 2016-12-23: qty 1

## 2016-12-23 MED ORDER — CEPHALEXIN 500 MG PO CAPS: 500.0000 mg | ORAL_CAPSULE | Freq: Two times a day (BID) | ORAL | 0 refills | Status: DC

## 2016-12-23 NOTE — Telephone Encounter (Signed)
Given her current symptoms, I would like for her to go ahead and be evaluated today to confirm nothing more acute going on.  Then can f/u after.

## 2016-12-23 NOTE — Telephone Encounter (Signed)
Spoke with patient states on Monday had episode with  hurting in mid stomach around belly button  , took Pepto besmol , some nausea.   She would like to go ahead with ultrasound of abdomen that discussed at last office visit.  No problems with constipation however she took some Miralax she had on hand.   Please advise.

## 2016-12-23 NOTE — Telephone Encounter (Signed)
I have placed the order for the ultrasound.   Please confirm pt doing ok now and does not need to be seen prior to ultrasound.

## 2016-12-23 NOTE — ED Notes (Signed)

## 2016-12-23 NOTE — ED Triage Notes (Signed)
Patient presents to ED via POV from home with c/o abdominal pain since Monday. Patient denies vomiting or diarrhea. Patient c/o "very little" nausea. Patient ambulatory to room. NAD noted.

## 2016-12-23 NOTE — Telephone Encounter (Signed)
Pt requested to have a ultrasound ordered for her stomach issues.  Pt contact  [phone]

## 2016-12-23 NOTE — ED Provider Notes (Addendum)
[facility] Emergency Department Provider Note  ____________________________________________   First MD Initiated Contact with Patient 12/23/16 2206     (approximate)  I have reviewed the triage vital signs and the nursing notes.   HISTORY  Chief Complaint Abdominal Pain    HPI Teresa Wade is a 83 y.o. female who presents for evaluation of about 4 days of intermittent but persistent epigastric abdominal pain.  She has had nausea but no vomiting.  She states that the symptoms are worse after she eats and then gets better slowly over time.  She describes it as a dull ache.  It does not radiate to her lower abdomen.  She denies fever/chills, chest pain, shortness of breath, vomiting, diarrhea, dysuria.   Past Medical History:  Diagnosis Date  . Abnormal chest CT    right hilar cyst  . Anxiety   . Emphysema of lung ([place])   . Fibrocystic disease of breast   . GERD (gastroesophageal reflux disease)   . History of thrombocytopenia   . Hypercholesterolemia   . Hyperglycemia   . Hypertension   . Osteoarthritis   . Osteoporosis    s/p Actonel, Reclast (last 2011)  . Psoriasis     Patient Active Problem List   Diagnosis Date Noted  . Nausea 12/08/2016  . Back pain 12/08/2016  . Itching 06/02/2016  . Hoarseness 07/16/2015  . Health care maintenance 06/29/2015  . Obesity (BMI 30-39.9) 12/01/2014  . Thumb pain 07/28/2014  . Hyperglycemia 03/23/2014  . History of colonic polyps 12/02/2013  . GERD (gastroesophageal reflux disease) 11/08/2012  . Osteoporosis 11/08/2012  . Diabetes ([place]) 11/08/2012  . Hypercholesterolemia 06/22/2010  . Essential hypertension 06/22/2010  . ALLERGIC REACTION 06/22/2010    Past Surgical History:  Procedure Laterality Date  . ABDOMINAL HYSTERECTOMY  1980  . BREAST EXCISIONAL BIOPSY  1976   benign    Prior to Admission medications   Medication Sig Start Date End Date Taking? Authorizing Provider  acebutolol  (SECTRAL) 200 MG capsule TAKE 2 CAPSULES BY MOUTH EVERY MORNING & 1 CAPSULE EVERY EVENING 10/04/16   Einar Pheasant, MD  aspirin 81 MG tablet Take 81 mg by mouth daily.    Historical Provider, MD  calcium carbonate (OS-CAL) 1250 MG chewable tablet Chew 1 tablet by mouth 2 (two) times daily.    Historical Provider, MD  cephALEXin (KEFLEX) 500 MG capsule Take 1 capsule (500 mg total) by mouth 2 (two) times daily. 12/23/16   Hinda Kehr, MD  esomeprazole (NEXIUM) 40 MG capsule TAKE ONE CAPSULE BY MOUTH TWICE A DAY 03/08/16   Einar Pheasant, MD  losartan (COZAAR) 25 MG tablet TAKE 1 TABLET (25 MG TOTAL) BY MOUTH DAILY. 01/12/16   Einar Pheasant, MD  magnesium oxide (MAG-OX) 400 MG tablet Take 400 mg by mouth daily.    Historical Provider, MD  nitroGLYCERIN (NITROSTAT) 0.4 MG SL tablet Place 1 tablet (0.4 mg total) under the tongue every 5 (five) minutes as needed for chest pain. May repeat x 1.  If persistent pain, call 911 03/11/15   Einar Pheasant, MD  Omega-3 Fatty Acids (FISH OIL TRIPLE STRENGTH) 1400 MG CAPS Take 1 tablet by mouth daily.    Historical Provider, MD  sertraline (ZOLOFT) 25 MG tablet TAKE 1 TABLET BY MOUTH DAILY 06/20/16   Einar Pheasant, MD  simvastatin (ZOCOR) 40 MG tablet TAKE 1 TABLET BY MOUTH AT BEDTIME 08/04/16   Einar Pheasant, MD  [facility] external suspension  11/05/14   Historical Provider,  MD    Allergies Clarithromycin; Lodine [etodolac]; Minocycline; Naproxen sodium; Rofecoxib; Vibramycin [doxycycline calcium]; and Mobic [meloxicam]  Family History  Problem Relation Age of Onset  . Heart disease Mother   . Mental illness      sibling, suicide  . Colon cancer Sister     Social History Social History  Substance Use Topics  . Smoking status: Former Research scientist (life sciences)  . Smokeless tobacco: Never Used  . Alcohol use No    Review of Systems Constitutional: No fever/chills Eyes: No visual changes. ENT: No sore throat. Cardiovascular: Denies chest pain. Respiratory: Denies  shortness of breath. Gastrointestinal: Epigastric abdominal pain.  Nausea, no vomiting.  No diarrhea.  No constipation. Genitourinary: Negative for dysuria. Musculoskeletal: Negative for back pain. Skin: Negative for rash. Neurological: Negative for headaches, focal weakness or numbness.  10-point ROS otherwise negative.  ____________________________________________   PHYSICAL EXAM:  VITAL SIGNS: ED Triage Vitals  Enc Vitals Group     BP 12/23/16 1832 (!) 154/49     Pulse Rate 12/23/16 1832 67     Resp 12/23/16 1832 17     Temp 12/23/16 1832 97.9 F (36.6 C)     Temp Source 12/23/16 1832 Oral     SpO2 12/23/16 1832 98 %     Weight 12/23/16 1832 160 lb (72.6 kg)     Height 12/23/16 1832 5' (1.524 m)     Head Circumference --      Peak Flow --      Pain Score 12/23/16 1833 4     Pain Loc --      Pain Edu? --      Excl. in [place]? --     Constitutional: Alert and oriented. Well appearing and in no acute distress. Eyes: Conjunctivae are normal. PERRL. EOMI. Head: Atraumatic. Nose: No congestion/rhinnorhea. Mouth/Throat: Mucous membranes are moist.  Oropharynx non-erythematous. Neck: No stridor.  No meningeal signs.   Cardiovascular: Normal rate, regular rhythm. Good peripheral circulation. Grossly normal heart sounds. Respiratory: Normal respiratory effort.  No retractions. Lungs CTAB. Gastrointestinal: Soft With no significant tenderness to palpation of the epigastrium or right upper quadrant but some mild to moderate tenderness of the suprapubic region.  No right lower quadrant tenderness at McBurney's point. Musculoskeletal: No lower extremity tenderness nor edema. No gross deformities of extremities. Neurologic:  Normal speech and language. No gross focal neurologic deficits are appreciated.  Skin:  Skin is warm, dry and intact. No rash noted. Psychiatric: Mood and affect are normal. Speech and behavior are normal.  ____________________________________________    LABS (all labs ordered are listed, but only abnormal results are displayed)  Labs Reviewed  COMPREHENSIVE METABOLIC PANEL - Abnormal; Notable for the following:       Result Value   Sodium 134 (*)    Chloride 99 (*)    Glucose, Bld 132 (*)    BUN 23 (*)    Creatinine, Ser 1.03 (*)    ALT 13 (*)    GFR calc non Af Amer 49 (*)    GFR calc Af Amer 57 (*)    All other components within normal limits  URINALYSIS, COMPLETE (UACMP) WITH MICROSCOPIC - Abnormal; Notable for the following:    Color, Urine YELLOW (*)    APPearance HAZY (*)    Leukocytes, UA MODERATE (*)    Squamous Epithelial / LPF 6-30 (*)    All other components within normal limits  URINE CULTURE  LIPASE, BLOOD  CBC   ____________________________________________  EKG  None -  EKG not ordered by ED physician ____________________________________________  RADIOLOGY   US Abdomen Limited Ruq  Result Date: 12/23/2016 CLINICAL DATA:  Acute onset of epigastric abdominal pain and nausea. Initial encounter. EXAM: US ABDOMEN LIMITED - RIGHT UPPER QUADRANT COMPARISON:  Abdominal ultrasound performed 03/11/2016, and CT of the abdomen and pelvis from 03/29/2012 FINDINGS: Gallbladder: No gallstones or wall thickening visualized. No sonographic Murphy sign noted by sonographer. Common bile duct: Diameter: 0.7 cm, within normal limits in caliber for the patient's age. Liver: No focal lesion identified. Mildly increased parenchymal echogenicity likely reflects fatty infiltration. IMPRESSION: 1. No acute abnormality seen at the right upper quadrant. 2. Mild fatty infiltration within the liver. Electronically Signed   By: Garald Balding M.D.   On: 12/23/2016 23:03    ____________________________________________   PROCEDURES  Procedure(s) performed:   Procedures   Critical Care performed: No ____________________________________________   INITIAL IMPRESSION / ASSESSMENT AND PLAN / ED COURSE  Pertinent labs & imaging  results that were available during my care of the patient were reviewed by me and considered in my medical decision making (see chart for details).  The patient has a urinary tract infection but does not report any recent dysuria.  She has signs and symptoms that are most consistent with gallbladder disease although she is currently not very tender to palpation.  Over the last several days the fact that she is having epigastric pain and upper abdominal pain after eating, however, there is consideration and investigation.  Her lab work is unremarkable other than the positive urinalysis and I will obtain a right upper quadrant ultrasound to rule out gallstones.  She is ambulating around the department without any difficulty and is in no acute distress.  I do not feel she needs a CT scan at this time.  I will treat her for her urinary tract infection and anticipate she will not have acute cholecystitis but may refer her to surgery for outpatient follow-up.  Waiting ultrasound results (10:44 PM).   Clinical Course as of Dec 23 2312  Thu Dec 23, 2016  2312 U/S normal.  Discussed with patient.  Gave usual/customary return precautions.  [CF]    Clinical Course User Index [CF] Hinda Kehr, MD    ____________________________________________  FINAL CLINICAL IMPRESSION(S) / ED DIAGNOSES  Final diagnoses:  Epigastric pain  Urinary tract infection without hematuria, site unspecified     MEDICATIONS GIVEN DURING THIS VISIT:  Medications  cephALEXin (KEFLEX) capsule 500 mg      NEW OUTPATIENT MEDICATIONS STARTED DURING THIS VISIT:  New Prescriptions   CEPHALEXIN (KEFLEX) 500 MG CAPSULE    Take 1 capsule (500 mg total) by mouth 2 (two) times daily.    Modified Medications   No medications on file    Discontinued Medications   No medications on file     Note:  This document was prepared using Dragon voice recognition software and may include unintentional dictation errors.    Hinda Kehr, MD 12/23/16 2313    Hinda Kehr, MD 12/23/16 [phone]

## 2016-12-23 NOTE — ED Notes (Signed)
Patient transported to Ultrasound 

## 2016-12-23 NOTE — Telephone Encounter (Signed)
Patient advised of below of verbalized an understanding

## 2016-12-23 NOTE — Telephone Encounter (Signed)
Reason for call:  Symptoms: between breast bone and belly [place], pain comes and goes ,no chest pain, sweating, no nausea, no arm numbness, been doing BRAT diet  Duration since Monday Medications:Tums  Last seen for this problem: Seen by: Please advise , advised if pain worsens to go to ER or urgent care

## 2016-12-23 NOTE — Discharge Instructions (Signed)
Your workup today was reassuring except that you do have a urinary tract infection.  We have provided a prescription for antibiotics.  Please take the full course of treatment (do not stop taking the medication even if your symptoms go away).  Please follow-up with your regular doctor for the next available follow-up appointment.  Return to the emergency department if you develop new or worsening symptoms that concern you.

## 2016-12-25 LAB — URINE CULTURE: Special Requests: NORMAL

## 2016-12-27 ENCOUNTER — Other Ambulatory Visit: Payer: Self-pay | Admitting: Internal Medicine

## 2016-12-29 ENCOUNTER — Ambulatory Visit: Payer: Medicare Other

## 2016-12-31 ENCOUNTER — Encounter (INDEPENDENT_AMBULATORY_CARE_PROVIDER_SITE_OTHER): Payer: Self-pay | Admitting: Vascular Surgery

## 2016-12-31 ENCOUNTER — Ambulatory Visit (INDEPENDENT_AMBULATORY_CARE_PROVIDER_SITE_OTHER): Payer: Medicare Other | Admitting: Vascular Surgery

## 2016-12-31 VITALS — BP 126/64 | HR 67 | Resp 16 | Ht 60.0 in | Wt 156.8 lb

## 2016-12-31 DIAGNOSIS — E7800 Pure hypercholesterolemia, unspecified: Secondary | ICD-10-CM

## 2016-12-31 DIAGNOSIS — I83812 Varicose veins of left lower extremities with pain: Secondary | ICD-10-CM | POA: Diagnosis not present

## 2016-12-31 DIAGNOSIS — I10 Essential (primary) hypertension: Secondary | ICD-10-CM

## 2016-12-31 NOTE — Patient Instructions (Signed)
Venous Stasis or Chronic Venous Insufficiency Chronic venous insufficiency, also called venous stasis, is a condition that affects the veins in the legs. The condition prevents blood from being pumped through these veins effectively. Blood may no longer be pumped effectively from the legs back to the heart. This condition can range from mild to severe. With proper treatment, you should be able to continue with an active life. CAUSES  Chronic venous insufficiency occurs when the vein walls become stretched, weakened, or damaged or when valves within the vein are damaged. Some common causes of this include:  High blood pressure inside the veins (venous hypertension).  Increased blood pressure in the leg veins from long periods of sitting or standing.  A blood clot that blocks blood flow in a vein (deep vein thrombosis).  Inflammation of a superficial vein (phlebitis) that causes a blood clot to form. RISK FACTORS Various things can make you more likely to develop chronic venous insufficiency, including:  Family history of this condition.  Obesity.  Pregnancy.  Sedentary lifestyle.  Smoking.  Jobs requiring long periods of standing or sitting in one place.  Being a certain age. Women in their 40s and 50s and men in their 70s are more likely to develop this condition. SIGNS AND SYMPTOMS  Symptoms may include:   Varicose veins.  Skin breakdown or ulcers.  Reddened or discolored skin on the leg.  Brown, smooth, tight, and painful skin just above the ankle, usually on the inside surface (lipodermatosclerosis).  Swelling. DIAGNOSIS  To diagnose this condition, your health care provider will take a medical history and do a physical exam. The following tests may be ordered to confirm the diagnosis:  Duplex ultrasound-A procedure that produces a picture of a blood vessel and nearby organs and also provides information on blood flow through the blood vessel.  Plethysmography-A  procedure that tests blood flow.  A venogram, or venography-A procedure used to look at the veins using X-ray and dye. TREATMENT The goals of treatment are to help you return to an active life and to minimize pain or disability. Treatment will depend on the severity of the condition. Medical procedures may be needed for severe cases. Treatment options may include:   Use of compression stockings. These can help with symptoms and lower the chances of the problem getting worse, but they do not cure the problem.  Sclerotherapy-A procedure involving an injection of a material that "dissolves" the damaged veins. Other veins in the network of blood vessels take over the function of the damaged veins.  Surgery to remove the vein or cut off blood flow through the vein (vein stripping or laser ablation surgery).  Surgery to repair a valve. HOME CARE INSTRUCTIONS   Wear compression stockings as directed by your health care provider.  Only take over-the-counter or prescription medicines for pain, discomfort, or fever as directed by your health care provider.  Follow up with your health care provider as directed. SEEK MEDICAL CARE IF:   You have redness, swelling, or increasing pain in the affected area.  You see a red streak or line that extends up or down from the affected area.  You have a breakdown or loss of skin in the affected area, even if the breakdown is small.  You have an injury to the affected area. SEEK IMMEDIATE MEDICAL CARE IF:   You have an injury and open wound in the affected area.  Your pain is severe and does not improve with medicine.  You have   sudden numbness or weakness in the foot or ankle below the affected area, or you have trouble moving your foot or ankle.  You have a fever or persistent symptoms for more than 2-3 days.  You have a fever and your symptoms suddenly get worse. MAKE SURE YOU:   Understand these instructions.  Will watch your condition.  Will  get help right away if you are not doing well or get worse. This information is not intended to replace advice given to you by your health care provider. Make sure you discuss any questions you have with your health care provider. Document Released: 03/21/2007 Document Revised: 09/05/2013 Document Reviewed: 07/23/2013 Elsevier Interactive Patient Education  2017 [employer]  

## 2016-12-31 NOTE — Progress Notes (Signed)
Patient ID: Teresa Wade, female   DOB: 10-Oct-1932, 83 y.o.   MRN: [id number]  Chief Complaint  Patient presents with  . New Patient (Initial Visit)    HPI Teresa Wade is a 83 y.o. female.  The patient presents with complaints of symptomatic varicosities of the Left leg. The patient reports a long standing history of varicosities and they have become painful over time. She has noticed that the varicosity has become larger, and there now appears to be somewhat of a knot just below the medial left knee. There was no clear inciting event or causative factor that started the symptoms although she started to notice some more after a minor knee injury a few years ago.  The left leg is more severly affected. The patient elevates the legs for relief. The pain is described as stinging and burning. The symptoms are generally most severe in the evening, particularly when they have been on their feet for long periods of time. Elevation has been used to try to improve the symptoms with limited success. The patient complains of occasional swelling as an associated symptom. The patient has no previous history of deep venous thrombosis or superficial thrombophlebitis to their knowledge but is very concerned about this intentionally causing a blood clot in her left leg.     Past Medical History:  Diagnosis Date  . Abnormal chest CT    right hilar cyst  . Anxiety   . Emphysema of lung ([place])   . Fibrocystic disease of breast   . GERD (gastroesophageal reflux disease)   . History of thrombocytopenia   . Hypercholesterolemia   . Hyperglycemia   . Hypertension   . Osteoarthritis   . Osteoporosis    s/p Actonel, Reclast (last 2011)  . Psoriasis     Past Surgical History:  Procedure Laterality Date  . ABDOMINAL HYSTERECTOMY  1980  . BREAST EXCISIONAL BIOPSY  1976   benign    Family History  Problem Relation Age of Onset  . Heart disease Mother   . Hypertension Mother   . Mental  illness      sibling, suicide  . Colon cancer Sister   No bleeding or clotting disorders  Social History Social History  Substance Use Topics  . Smoking status: Former Research scientist (life sciences)  . Smokeless tobacco: Never Used  . Alcohol use No  No IV drug use  Allergies  Allergen Reactions  . Clarithromycin   . Lodine [Etodolac] Diarrhea  . Minocycline Other (See Comments)    Throat swells   . Naproxen Sodium     REACTION: Eye Swelling  . Rofecoxib     swell  . Vibramycin [Doxycycline Calcium] Other (See Comments)    Heart pounds and throat swells  . Mobic [Meloxicam] Rash    Current Outpatient Prescriptions  Medication Sig Dispense Refill  . acebutolol (SECTRAL) 200 MG capsule TAKE 2 CAPSULES BY MOUTH EVERY MORNING & 1 CAPSULE EVERY EVENING 270 capsule 2  . aspirin 81 MG tablet Take 81 mg by mouth daily.    . calcium carbonate (OS-CAL) 1250 MG chewable tablet Chew 1 tablet by mouth 2 (two) times daily.    . cephALEXin (KEFLEX) 500 MG capsule Take 1 capsule (500 mg total) by mouth 2 (two) times daily. 14 capsule 0  . esomeprazole (NEXIUM) 40 MG capsule TAKE ONE CAPSULE BY MOUTH TWICE A DAY 180 capsule 3  . losartan (COZAAR) 25 MG tablet TAKE 1 TABLET (25 MG TOTAL) BY MOUTH DAILY.  90 tablet 3  . magnesium oxide (MAG-OX) 400 MG tablet Take 400 mg by mouth daily.    . nitroGLYCERIN (NITROSTAT) 0.4 MG SL tablet Place 1 tablet (0.4 mg total) under the tongue every 5 (five) minutes as needed for chest pain. May repeat x 1.  If persistent pain, call 911 25 tablet 0  . Omega-3 Fatty Acids (FISH OIL TRIPLE STRENGTH) 1400 MG CAPS Take 1 tablet by mouth daily.    . sertraline (ZOLOFT) 25 MG tablet TAKE 1 TABLET BY MOUTH DAILY 90 tablet 1  . simvastatin (ZOCOR) 40 MG tablet TAKE 1 TABLET BY MOUTH AT BEDTIME 90 tablet 2  . TACLONEX external suspension   2   No current facility-administered medications for this visit.       REVIEW OF SYSTEMS (Negative unless checked)  Constitutional: _0 Weight loss   _1 Fever  _2 Chills Cardiac: _3 Chest pain   _4 Chest pressure   _5 Palpitations   _6 Shortness of breath when laying flat   _7 Shortness of breath at rest   _8 Shortness of breath with exertion. Vascular:  _9 Pain in legs with walking   _10 Pain in legs at rest   _11 Pain in legs when laying flat   _12 Claudication   _13 Pain in feet when walking  _14 Pain in feet at rest  _15 Pain in feet when laying flat   _16 History of DVT   _17 Phlebitis   _18 Swelling in legs   _19 Varicose veins   _20 Non-healing ulcers Pulmonary:   _21 Uses home oxygen   _22 Productive cough   _23 Hemoptysis   _24 Wheeze  _25 COPD   _26 Asthma Neurologic:  _27 Dizziness  _28 Blackouts   _29 Seizures   _30 History of stroke   _31 History of TIA  _32 Aphasia   _33 Temporary blindness   _34 Dysphagia   _35 Weakness or numbness in arms   _36 Weakness or numbness in legs Musculoskeletal:  _37 Arthritis   _38 Joint swelling   _39 Joint pain   _40 Low back pain Hematologic:  _41 Easy bruising  _42 Easy bleeding   _43 Hypercoagulable state   _44 Anemic  _45 Hepatitis Gastrointestinal:  _46 Blood in stool   _47 Vomiting blood  _48 Gastroesophageal reflux/heartburn   _49 Abdominal pain Genitourinary:  _50 Chronic kidney disease   _51 Difficult urination  _52 Frequent urination  _53 Burning with urination   _54 Hematuria Skin:  _55 Rashes   _56 Ulcers   _57 Wounds Psychological:  _58 History of anxiety   _59  History of major depression.    Physical Exam BP 126/64   Pulse 67   Resp 16   Ht 5' (1.524 m)   Wt 156 lb 12.8 oz (71.1 kg)   LMP 11/09/1979   BMI 30.62 kg/m  Gen:  WD/WN, NAD. Appears younger than stated age. Head: [place]/AT, No temporalis wasting.  Ear/Nose/Throat: Hearing grossly intact, nares patent Eyes: Sclera non-icteric. Conjunctiva clear Neck: Supple, no nuchal rigidity. Trachea midline Pulmonary:  Good air movement, no use of accessory muscles, respirations not labored.  Cardiac: RRR, No JVD Vascular: Varicosities scattered and measuring up to 1-2 mm in the right lower extremity        Varicosities diffuse and  measuring up to 4 mm in the left lower extremity particularly in the medial calf area just below the knee Vessel Right Left  Radial Palpable Palpable  Ulnar Palpable Palpable  Brachial Palpable Palpable  Carotid Palpable, without bruit Palpable, without bruit  Aorta Not palpable N/A  Femoral Palpable Palpable  Popliteal Palpable Palpable  PT Palpable Palpable  DP Palpable Palpable   Gastrointestinal: soft, non-tender/non-distended. No guarding/reflex. No masses, surgical incisions, or scars. Musculoskeletal: M/S 5/5 throughout.   No RLE edema.  Trace LLE edema Neurologic: Sensation grossly intact in extremities.  Symmetrical.  Speech is fluent.  Psychiatric: Judgment intact, Mood & affect appropriate for pt's clinical situation. Dermatologic: No rashes or ulcers noted.  No cellulitis or open wounds. Lymph : No Cervical, Axillary, or Inguinal lymphadenopathy.   Radiology Dg Chest 2 View  Result Date: 12/07/2016 CLINICAL DATA:  Chest pain EXAM: CHEST  2 VIEW COMPARISON:  chest CT 03/23/2011 FINDINGS: Cardiomediastinal contours are normal with mild atherosclerotic calcification in the aortic arch. The lungs are clear. No pleural effusion or pneumothorax. IMPRESSION: Aortic atherosclerosis without focal airspace disease or pulmonary edema. Electronically Signed   By: Ulyses Jarred M.D.   On: 12/07/2016 02:48   Ct Angio Chest Pe W Or Wo Contrast  Result Date: 12/07/2016 CLINICAL DATA:  Chest pressure. No relief with nitroglycerin. Back pain and right shoulder pain. EXAM: CT ANGIOGRAPHY CHEST WITH CONTRAST TECHNIQUE: Multidetector CT imaging of the chest was performed using the standard protocol during bolus administration of intravenous contrast. Multiplanar CT image reconstructions and MIPs were obtained to evaluate the vascular anatomy. CONTRAST:  60 mL Isovue 370 COMPARISON:  03/23/2011 FINDINGS: Cardiovascular: Satisfactory opacification of the pulmonary arteries to the segmental level. No  evidence of pulmonary embolism. Normal heart size. No pericardial effusion. Normal caliber aorta. No aortic dissection. Scattered aortic calcifications. Mediastinum/Nodes: No significant lymphadenopathy. Visualize lymph nodes are not pathologically enlarged. Esophagus is decompressed. 1.2 cm nodule in the left thyroid gland is unchanged since prior studies. Small cystic structure adjacent to the left atrium mid inferior to the pulmonary veins, measuring 1.5 cm diameter. No change. This is likely a pericardial or duplication cyst. Lungs/Pleura: No focal airspace disease or consolidation in the lungs. No pleural effusions. No pneumothorax. Airways are patent. Upper Abdomen: No acute abnormality. Musculoskeletal: Mild degenerative changes in the spine. No destructive bone lesions. Review of the MIP images confirms the above findings. IMPRESSION: No evidence of significant pulmonary embolus. No evidence of active pulmonary disease. Stable appearance since previous study. Electronically Signed   By: Lucienne Capers M.D.   On: 12/07/2016 04:58   US Abdomen Limited Ruq  Result Date: 12/23/2016 CLINICAL DATA:  Acute onset of epigastric abdominal pain and nausea. Initial encounter. EXAM: US ABDOMEN LIMITED - RIGHT UPPER QUADRANT COMPARISON:  Abdominal ultrasound performed 03/11/2016, and CT of the abdomen and pelvis from 03/29/2012 FINDINGS: Gallbladder: No gallstones or wall thickening visualized. No sonographic Murphy sign noted by sonographer. Common bile duct: Diameter: 0.7 cm, within normal limits in caliber for the patient's age. Liver: No focal lesion identified. Mildly increased parenchymal echogenicity likely reflects fatty infiltration. IMPRESSION: 1. No acute abnormality seen at the right upper quadrant. 2. Mild fatty infiltration within the liver. Electronically Signed   By: Garald Balding M.D.   On: 12/23/2016 23:03    Labs Recent Results (from the past 2160 hour(s))  Basic metabolic panel     Status:  Abnormal   Collection Time: 12/07/16  2:18 AM  Result Value Ref Range   Sodium 138 135 - 145 mmol/L   Potassium 4.5 3.5 - 5.1 mmol/L   Chloride 103 101 - 111 mmol/L   CO2 28 22 - 32 mmol/L   Glucose, Bld 141 (H) 65 - 99 mg/dL   BUN 23 (H) 6 - 20 mg/dL   Creatinine, Ser 1.10 (H) 0.44 - 1.00 mg/dL   Calcium 9.4 8.9 - 10.3 mg/dL   GFR calc non Af Amer 45 (L) >60 mL/min   GFR calc Af [place] Lora  53 (L) >60 mL/min    Comment: (NOTE) The eGFR has been calculated using the CKD EPI equation. This calculation has not been validated in all clinical situations. eGFR's persistently <60 mL/min signify possible Chronic Kidney Disease.    Anion gap 7 5 - 15  CBC     Status: None   Collection Time: 12/07/16  2:18 AM  Result Value Ref Range   WBC 8.0 3.6 - 11.0 K/uL   RBC 4.39 3.80 - 5.20 MIL/uL   Hemoglobin 13.2 12.0 - 16.0 g/dL   HCT 38.9 35.0 - 47.0 %   MCV 88.6 80.0 - 100.0 fL   MCH 30.1 26.0 - 34.0 pg   MCHC 34.0 32.0 - 36.0 g/dL   RDW 13.8 11.5 - 14.5 %   Platelets 205 150 - 440 K/uL  Troponin I     Status: None   Collection Time: 12/07/16  2:18 AM  Result Value Ref Range   Troponin I <0.03 <0.03 ng/mL  Troponin I     Status: None   Collection Time: 12/07/16  6:05 AM  Result Value Ref Range   Troponin I <0.03 <0.03 ng/mL  Lipid panel     Status: Abnormal   Collection Time: 12/08/16  9:23 AM  Result Value Ref Range   Cholesterol 171 0 - 200 mg/dL    Comment: ATP III Classification       Desirable:  < 200 mg/dL               Borderline High:  200 - 239 mg/dL          High:  > = 240 mg/dL   Triglycerides 193.0 (H) 0.0 - 149.0 mg/dL    Comment: Normal:  <150 mg/dLBorderline High:  150 - 199 mg/dL   HDL 54.70 >39.00 mg/dL   VLDL 38.6 0.0 - 40.0 mg/dL   LDL Cholesterol 78 0 - 99 mg/dL   Total CHOL/HDL Ratio 3     Comment:                Men          Women1/2 Average Risk     3.4          3.3Average Risk          5.0          4.42X Average Risk          9.6          7.13X Average Risk           15.0          11.0                       NonHDL 116.49     Comment: NOTE:  Non-HDL goal should be 30 mg/dL higher than patient's LDL goal (i.e. LDL goal of < 70 mg/dL, would have non-HDL goal of < 100 mg/dL)  Hemoglobin A1c     Status: Abnormal   Collection Time: 12/08/16  9:23 AM  Result Value Ref Range   Hgb A1c MFr Bld 6.6 (H) 4.6 - 6.5 %    Comment: Glycemic Control Guidelines for People with Diabetes:Non Diabetic:  <6%Goal of Therapy: <7%Additional Action Suggested:  >8%   Hepatic function panel     Status: None   Collection Time: 12/08/16  9:23 AM  Result Value Ref Range   Total Bilirubin 0.7 0.2 - 1.2 mg/dL   Bilirubin, Direct 0.1 0.0 -  0.3 mg/dL   Alkaline Phosphatase 70 39 - 117 U/L   AST 19 0 - 37 U/L   ALT 12 0 - 35 U/L   Total Protein 6.8 6.0 - 8.3 g/dL   Albumin 4.2 3.5 - 5.2 g/dL  Basic metabolic panel     Status: Abnormal   Collection Time: 12/08/16  9:23 AM  Result Value Ref Range   Sodium 140 135 - 145 mEq/L   Potassium 5.0 3.5 - 5.1 mEq/L   Chloride 102 96 - 112 mEq/L   CO2 31 19 - 32 mEq/L   Glucose, Bld 109 (H) 70 - 99 mg/dL   BUN 21 6 - 23 mg/dL   Creatinine, Ser 1.01 0.40 - 1.20 mg/dL   Calcium 9.7 8.4 - 10.5 mg/dL   GFR 55.65 (L) >60.00 mL/min  Amylase     Status: None   Collection Time: 12/08/16  9:23 AM  Result Value Ref Range   Amylase 40 27 - 131 U/L  Lipase     Status: None   Collection Time: 12/08/16  9:23 AM  Result Value Ref Range   Lipase 14.0 11.0 - 59.0 U/L  Lipase, blood     Status: None   Collection Time: 12/23/16  6:35 PM  Result Value Ref Range   Lipase 22 11 - 51 U/L  Comprehensive metabolic panel     Status: Abnormal   Collection Time: 12/23/16  6:35 PM  Result Value Ref Range   Sodium 134 (L) 135 - 145 mmol/L   Potassium 4.2 3.5 - 5.1 mmol/L   Chloride 99 (L) 101 - 111 mmol/L   CO2 24 22 - 32 mmol/L   Glucose, Bld 132 (H) 65 - 99 mg/dL   BUN 23 (H) 6 - 20 mg/dL   Creatinine, Ser 1.03 (H) 0.44 - 1.00 mg/dL   Calcium 9.0  8.9 - 10.3 mg/dL   Total Protein 7.2 6.5 - 8.1 g/dL   Albumin 3.8 3.5 - 5.0 g/dL   AST 22 15 - 41 U/L   ALT 13 (L) 14 - 54 U/L   Alkaline Phosphatase 63 38 - 126 U/L   Total Bilirubin 0.8 0.3 - 1.2 mg/dL   GFR calc non Af Amer 49 (L) >60 mL/min   GFR calc Af Amer 57 (L) >60 mL/min    Comment: (NOTE) The eGFR has been calculated using the CKD EPI equation. This calculation has not been validated in all clinical situations. eGFR's persistently <60 mL/min signify possible Chronic Kidney Disease.    Anion gap 11 5 - 15  CBC     Status: None   Collection Time: 12/23/16  6:35 PM  Result Value Ref Range   WBC 7.1 3.6 - 11.0 K/uL   RBC 4.21 3.80 - 5.20 MIL/uL   Hemoglobin 12.6 12.0 - 16.0 g/dL   HCT 37.2 35.0 - 47.0 %   MCV 88.4 80.0 - 100.0 fL   MCH 30.0 26.0 - 34.0 pg   MCHC 33.9 32.0 - 36.0 g/dL   RDW 13.5 11.5 - 14.5 %   Platelets 203 150 - 440 K/uL  Urinalysis, Complete w Microscopic     Status: Abnormal   Collection Time: 12/23/16  6:35 PM  Result Value Ref Range   Color, Urine YELLOW (A) YELLOW   APPearance HAZY (A) CLEAR   Specific Gravity, Urine 1.020 1.005 - 1.030   pH 6.0 5.0 - 8.0   Glucose, UA NEGATIVE NEGATIVE mg/dL   Hgb urine dipstick NEGATIVE NEGATIVE  Bilirubin Urine NEGATIVE NEGATIVE   Ketones, ur NEGATIVE NEGATIVE mg/dL   Protein, ur NEGATIVE NEGATIVE mg/dL   Nitrite NEGATIVE NEGATIVE   Leukocytes, UA MODERATE (A) NEGATIVE   RBC / HPF 0-5 0 - 5 RBC/hpf   WBC, UA TOO NUMEROUS TO COUNT 0 - 5 WBC/hpf   Bacteria, UA NONE SEEN NONE SEEN   Squamous Epithelial / LPF 6-30 (A) NONE SEEN   Mucous PRESENT    Hyaline Casts, UA PRESENT   Urine culture     Status: Abnormal   Collection Time: 12/23/16  6:35 PM  Result Value Ref Range   Specimen Description URINE, RANDOM    Special Requests Normal    Culture MULTIPLE SPECIES PRESENT, SUGGEST RECOLLECTION (A)    Report Status 12/25/2016 FINAL     Assessment/Plan:  Hypercholesterolemia lipid control important in  reducing the progression of atherosclerotic disease. Continue statin therapy   Essential hypertension blood pressure control important in reducing the progression of atherosclerotic disease. On appropriate oral medications.   Varicose veins of leg with pain, left Particularly in the prominent varicosity in the medial calf area. See workup and treatment plan as below.    The patient has symptoms consistent with chronic venous insufficiency. We discussed the natural history and treatment options for venous disease. I recommended the regular use of 20 - 30 mm Hg compression stockings, and prescribed these today. I recommended leg elevation and anti-inflammatories as needed for pain. I have also recommended a complete venous duplex to assess the venous system for reflux or thrombotic issues. This can be done at the patient's convenience. I will see the patient back after their duplex to assess the response to conservative management, and determine further treatment options.     Leotis Pain 12/31/2016, 11:06 AM   This note was created with Dragon medical transcription system.  Any errors from dictation are unintentional.

## 2016-12-31 NOTE — Assessment & Plan Note (Signed)
lipid control important in reducing the progression of atherosclerotic disease. Continue statin therapy  

## 2016-12-31 NOTE — Assessment & Plan Note (Signed)
blood pressure control important in reducing the progression of atherosclerotic disease. On appropriate oral medications.  

## 2016-12-31 NOTE — Assessment & Plan Note (Signed)
Particularly in the prominent varicosity in the medial calf area. See workup and treatment plan as below.

## 2017-01-12 ENCOUNTER — Encounter: Payer: Self-pay | Admitting: Internal Medicine

## 2017-01-12 ENCOUNTER — Ambulatory Visit (INDEPENDENT_AMBULATORY_CARE_PROVIDER_SITE_OTHER): Payer: Medicare Other | Admitting: Internal Medicine

## 2017-01-12 DIAGNOSIS — E669 Obesity, unspecified: Secondary | ICD-10-CM

## 2017-01-12 DIAGNOSIS — E119 Type 2 diabetes mellitus without complications: Secondary | ICD-10-CM

## 2017-01-12 DIAGNOSIS — R739 Hyperglycemia, unspecified: Secondary | ICD-10-CM | POA: Diagnosis not present

## 2017-01-12 DIAGNOSIS — I10 Essential (primary) hypertension: Secondary | ICD-10-CM | POA: Diagnosis not present

## 2017-01-12 DIAGNOSIS — K219 Gastro-esophageal reflux disease without esophagitis: Secondary | ICD-10-CM

## 2017-01-12 DIAGNOSIS — E7800 Pure hypercholesterolemia, unspecified: Secondary | ICD-10-CM

## 2017-01-12 NOTE — Progress Notes (Signed)
Patient ID: Teresa Wade, female   DOB: 1932-10-16, 83 y.o.   MRN: [id number]   Subjective:    Patient ID: Teresa Wade, female    DOB: [birth year], 82 y.o.   MRN: [id number]  HPI  Patient here for scheduled follow up.  Was seen in the ER for epigastric pain and nausea.  Had abdominal ultrasound and treated for UTI with keflex.  Saw GI 12/28/16.  Felt to have GERD - on nexium.  Was instructed to take a laxative and stress management.  Abdominal ultrasound - mild fattu infiltrate.  Taking miralax.  Feels better.  Eating.  No nausea or vomiting.  Desires no further intervention or evaluation.  No chest pain.  No sob.  No acid reflux.     Past Medical History:  Diagnosis Date  . Abnormal chest CT    right hilar cyst  . Anxiety   . Emphysema of lung ([place])   . Fibrocystic disease of breast   . GERD (gastroesophageal reflux disease)   . History of thrombocytopenia   . Hypercholesterolemia   . Hyperglycemia   . Hypertension   . Osteoarthritis   . Osteoporosis    s/p Actonel, Reclast (last 2011)  . Psoriasis    Past Surgical History:  Procedure Laterality Date  . ABDOMINAL HYSTERECTOMY  1980  . BREAST EXCISIONAL BIOPSY  1976   benign   Family History  Problem Relation Age of Onset  . Heart disease Mother   . Hypertension Mother   . Mental illness      sibling, suicide  . Colon cancer Sister    Social History   Social History  . Marital status: Married    Spouse name: N/A  . Number of children: 1  . Years of education: N/A   Social History Main Topics  . Smoking status: Former Research scientist (life sciences)  . Smokeless tobacco: Never Used  . Alcohol use No  . Drug use: No  . Sexual activity: Not Asked   Other Topics Concern  . None   Social History Narrative  . None    Outpatient Encounter Prescriptions as of 01/12/2017  Medication Sig  . acebutolol (SECTRAL) 200 MG capsule TAKE 2 CAPSULES BY MOUTH EVERY MORNING & 1 CAPSULE EVERY EVENING  . aspirin 81 MG tablet Take 81 mg by  mouth daily.  . calcium carbonate (OS-CAL) 1250 MG chewable tablet Chew 1 tablet by mouth 2 (two) times daily.  Marland Kitchen esomeprazole (NEXIUM) 40 MG capsule TAKE ONE CAPSULE BY MOUTH TWICE A DAY  . losartan (COZAAR) 25 MG tablet TAKE 1 TABLET (25 MG TOTAL) BY MOUTH DAILY.  . magnesium oxide (MAG-OX) 400 MG tablet Take 400 mg by mouth daily.  . nitroGLYCERIN (NITROSTAT) 0.4 MG SL tablet Place 1 tablet (0.4 mg total) under the tongue every 5 (five) minutes as needed for chest pain. May repeat x 1.  If persistent pain, call 911  . sertraline (ZOLOFT) 25 MG tablet TAKE 1 TABLET BY MOUTH DAILY  . simvastatin (ZOCOR) 40 MG tablet TAKE 1 TABLET BY MOUTH AT BEDTIME  . TACLONEX external suspension   . cephALEXin (KEFLEX) 500 MG capsule Take 1 capsule (500 mg total) by mouth 2 (two) times daily. (Patient not taking: Reported on 01/12/2017)  . Omega-3 Fatty Acids (FISH OIL TRIPLE STRENGTH) 1400 MG CAPS Take 1 tablet by mouth daily.   No facility-administered encounter medications on file as of 01/12/2017.     Review of Systems  Constitutional: Negative for appetite change and  unexpected weight change.  HENT: Negative for congestion and sinus pressure.   Respiratory: Negative for cough, chest tightness and shortness of breath.   Cardiovascular: Negative for chest pain, palpitations and leg swelling.  Gastrointestinal: Negative for diarrhea, nausea and vomiting.       Abdominal discomfort improved.    Genitourinary: Negative for difficulty urinating and dysuria.  Musculoskeletal: Negative for back pain and joint swelling.  Skin: Negative for color change and rash.  Neurological: Negative for dizziness, light-headedness and headaches.  Psychiatric/Behavioral: Negative for agitation and dysphoric mood.       Objective:     Blood pressure rechecked by me:  130/62  Physical Exam  Constitutional: She appears well-developed and well-nourished. No distress.  HENT:  Nose: Nose normal.  Mouth/Throat:  Oropharynx is clear and moist.  Neck: Neck supple. No thyromegaly present.  Cardiovascular: Normal rate and regular rhythm.   Pulmonary/Chest: Breath sounds normal. No respiratory distress. She has no wheezes.  Abdominal: Soft. Bowel sounds are normal. There is no tenderness.  Musculoskeletal: She exhibits no edema or tenderness.  Lymphadenopathy:    She has no cervical adenopathy.  Skin: No rash noted. No erythema.  Psychiatric: She has a normal mood and affect. Her behavior is normal.    BP 130/62   Pulse 65   Temp 97.9 F (36.6 C) (Oral)   Resp 16   Wt 161 lb (73 kg)   LMP 11/09/1979   SpO2 98%   BMI 31.44 kg/m  Wt Readings from Last 3 Encounters:  01/12/17 161 lb (73 kg)  12/31/16 156 lb 12.8 oz (71.1 kg)  12/23/16 160 lb (72.6 kg)     Lab Results  Component Value Date   WBC 7.1 12/23/2016   HGB 12.6 12/23/2016   HCT 37.2 12/23/2016   PLT 203 12/23/2016   GLUCOSE 132 (H) 12/23/2016   CHOL 171 12/08/2016   TRIG 193.0 (H) 12/08/2016   HDL 54.70 12/08/2016   LDLCALC 78 12/08/2016   ALT 13 (L) 12/23/2016   AST 22 12/23/2016   NA 134 (L) 12/23/2016   K 4.2 12/23/2016   CL 99 (L) 12/23/2016   CREATININE 1.03 (H) 12/23/2016   BUN 23 (H) 12/23/2016   CO2 24 12/23/2016   TSH 1.06 12/26/2015   HGBA1C 6.6 (H) 12/08/2016   MICROALBUR 0.8 12/26/2015    US Abdomen Limited Ruq  Result Date: 12/23/2016 CLINICAL DATA:  Acute onset of epigastric abdominal pain and nausea. Initial encounter. EXAM: US ABDOMEN LIMITED - RIGHT UPPER QUADRANT COMPARISON:  Abdominal ultrasound performed 03/11/2016, and CT of the abdomen and pelvis from 03/29/2012 FINDINGS: Gallbladder: No gallstones or wall thickening visualized. No sonographic Murphy sign noted by sonographer. Common bile duct: Diameter: 0.7 cm, within normal limits in caliber for the patient's age. Liver: No focal lesion identified. Mildly increased parenchymal echogenicity likely reflects fatty infiltration. IMPRESSION: 1. No  acute abnormality seen at the right upper quadrant. 2. Mild fatty infiltration within the liver. Electronically Signed   By: Garald Balding M.D.   On: 12/23/2016 23:03       Assessment & Plan:   Problem List Items Addressed This Visit    Diabetes ([place])    Low carb diet and exercise.  Follow met b and a1c.        Relevant Orders   Hemoglobin J1B   Basic metabolic panel   Microalbumin / creatinine urine ratio   Essential hypertension    Blood pressure on recheck improved.  Follow pressures.  Follow metabolic panel.        Relevant Orders   TSH   GERD (gastroesophageal reflux disease)    On nexium.  Upper symptoms controlled.  Abdominal discomfort better.  Follow.       Hypercholesterolemia    On simvastatin.  Low cholesterol diet and exercise.  Follow lipid panel and liver function tests.        Relevant Orders   Hepatic function panel   Lipid panel   Hyperglycemia    Low carb diet and exercise.  Follow met b and a1c.        Obesity (BMI 30-39.9)    Diet and exercise.  Follow.           Einar Pheasant, MD

## 2017-01-12 NOTE — Progress Notes (Signed)
Pre visit review using our clinic review tool, if applicable. No additional management support is needed unless otherwise documented below in the visit note. 

## 2017-01-23 ENCOUNTER — Encounter: Payer: Self-pay | Admitting: Internal Medicine

## 2017-01-23 NOTE — Assessment & Plan Note (Signed)
Low carb diet and exercise.  Follow met b and a1c.   

## 2017-01-23 NOTE — Assessment & Plan Note (Signed)
On simvastatin.  Low cholesterol diet and exercise.  Follow lipid panel and liver function tests.   

## 2017-01-23 NOTE — Assessment & Plan Note (Signed)
Diet and exercise.  Follow.  

## 2017-01-23 NOTE — Assessment & Plan Note (Signed)
Blood pressure on recheck improved.  Follow pressures.  Follow metabolic panel.  

## 2017-01-23 NOTE — Assessment & Plan Note (Signed)
On nexium.  Upper symptoms controlled.  Abdominal discomfort better.  Follow.

## 2017-02-14 ENCOUNTER — Telehealth: Payer: Self-pay | Admitting: Internal Medicine

## 2017-02-14 NOTE — Telephone Encounter (Signed)
Called patient back , states she is not currently experiencing any abdominal pain now.  She has to take husband to [facility] for therapy tomorrow she  will go to walk in clinic tomorrow for evaluation.

## 2017-02-14 NOTE — Telephone Encounter (Signed)
Pt called and stated that she is having stomach pains again and was advised to call Dr. Nicki Reaper and let her know and see what she wanted to do next. Please advise, thank you!  Call pt @ [phone]

## 2017-02-14 NOTE — Telephone Encounter (Signed)
Given increased abdominal pain, would recommend evaluation today.  Acute care or urgent care and then can f/u after.  Have previously been unable to identify pain.  Recommend evaluation while having to see if can determine etiology.

## 2017-02-14 NOTE — Telephone Encounter (Signed)
Called patient she states she is having the same pain as when she went to ED in January. She states that it has been going on for about a week or more. Pain ranging from 3 to 8 on a scale of 10. Pain is in center of abdomen. Gets better at night. She did have vomiting  one time on 02-12-17 only. She has started on bland diet already. Her pain today is a 3-10 Please advise.

## 2017-02-28 ENCOUNTER — Encounter (INDEPENDENT_AMBULATORY_CARE_PROVIDER_SITE_OTHER): Payer: Self-pay | Admitting: Vascular Surgery

## 2017-02-28 ENCOUNTER — Ambulatory Visit (INDEPENDENT_AMBULATORY_CARE_PROVIDER_SITE_OTHER): Payer: Medicare Other | Admitting: Vascular Surgery

## 2017-02-28 ENCOUNTER — Ambulatory Visit (INDEPENDENT_AMBULATORY_CARE_PROVIDER_SITE_OTHER): Payer: Medicare Other

## 2017-02-28 VITALS — BP 125/63 | HR 69 | Resp 17 | Wt 155.0 lb

## 2017-02-28 DIAGNOSIS — E119 Type 2 diabetes mellitus without complications: Secondary | ICD-10-CM | POA: Diagnosis not present

## 2017-02-28 DIAGNOSIS — I872 Venous insufficiency (chronic) (peripheral): Secondary | ICD-10-CM | POA: Diagnosis not present

## 2017-02-28 DIAGNOSIS — I83812 Varicose veins of left lower extremities with pain: Secondary | ICD-10-CM

## 2017-02-28 NOTE — Progress Notes (Signed)
Subjective:    Patient ID: Teresa Wade, female    DOB: August 08, 1932, 83 y.o.   MRN: [id number] Chief Complaint  Patient presents with  . Follow-up   Patient presents to review vascular studies. She was last seen on 12/31/16 for evaluation of left lower extremity painful varicose veins. Her symptoms are stable. She underwent a left lower extremity duplex which was notable for reflux in the GSV and SSV. The patient denies any fever, nausea or vomiting.    Review of Systems  Constitutional: Negative.   HENT: Negative.   Eyes: Negative.   Respiratory: Negative.   Cardiovascular: Positive for leg swelling.  Gastrointestinal: Negative.   Endocrine: Negative.   Genitourinary: Negative.   Musculoskeletal: Negative.   Skin: Negative.   Allergic/Immunologic: Negative.   Neurological: Negative.   Hematological: Negative.   Psychiatric/Behavioral: Negative.       Objective:   Physical Exam  Constitutional: She is oriented to person, place, and time. She appears well-developed and well-nourished. No distress.  HENT:  Head: Normocephalic and atraumatic.  Eyes: Conjunctivae are normal. Pupils are equal, round, and reactive to light.  Neck: Normal range of motion.  Cardiovascular: Normal rate, regular rhythm, normal heart sounds and intact distal pulses.   Pulses:      Radial pulses are 2+ on the right side, and 2+ on the left side.       Dorsalis pedis pulses are 2+ on the right side, and 2+ on the left side.       Posterior tibial pulses are 2+ on the right side, and 2+ on the left side.  Pulmonary/Chest: Effort normal.  Musculoskeletal: Normal range of motion. She exhibits no edema (No edema noted. ).  Neurological: She is alert and oriented to person, place, and time.  Skin: Skin is warm and dry. She is not diaphoretic.  Psychiatric: She has a normal mood and affect. Her behavior is normal. Judgment and thought content normal.   BP 125/63   Pulse 69   Resp 17   Wt 155 lb (70.3  kg)   LMP 11/09/1979   BMI 30.27 kg/m   Past Medical History:  Diagnosis Date  . Abnormal chest CT    right hilar cyst  . Anxiety   . Emphysema of lung ([place])   . Fibrocystic disease of breast   . GERD (gastroesophageal reflux disease)   . History of thrombocytopenia   . Hypercholesterolemia   . Hyperglycemia   . Hypertension   . Osteoarthritis   . Osteoporosis    s/p Actonel, Reclast (last 2011)  . Psoriasis    Social History   Social History  . Marital status: Married    Spouse name: N/A  . Number of children: 1  . Years of education: N/A   Occupational History  . Not on file.   Social History Main Topics  . Smoking status: Former Research scientist (life sciences)  . Smokeless tobacco: Never Used  . Alcohol use No  . Drug use: No  . Sexual activity: Not on file   Other Topics Concern  . Not on file   Social History Narrative  . No narrative on file   Past Surgical History:  Procedure Laterality Date  . ABDOMINAL HYSTERECTOMY  1980  . BREAST EXCISIONAL BIOPSY  1976   benign   Family History  Problem Relation Age of Onset  . Heart disease Mother   . Hypertension Mother   . Mental illness      sibling, suicide  .  Colon cancer Sister    Allergies  Allergen Reactions  . Clarithromycin   . Lodine [Etodolac] Diarrhea  . Minocycline Other (See Comments)    Throat swells   . Naproxen Sodium     REACTION: Eye Swelling  . Rofecoxib     swell  . Vibramycin [Doxycycline Calcium] Other (See Comments)    Heart pounds and throat swells  . Mobic [Meloxicam] Rash      Assessment & Plan:  Patient presents to review vascular studies. She was last seen on 12/31/16 for evaluation of left lower extremity painful varicose veins. Her symptoms are stable. She underwent a left lower extremity duplex which was notable for reflux in the GSV and SSV. The patient denies any fever, nausea or vomiting.   1. Varicose veins of leg with pain, left - Stable Study reviewed with patient. Venous duplex  shows LLE GSV and SSV reflux.  I have discussed chronic venous insufficiency with the patient, why it causes symptoms and how to manage them.  The patient was encouraged to wear graduated compression stockings (20-30 mmHg) on a daily basis. The patient was instructed to begin wearing the stockings first thing in the morning and removing them in the evening. The patient was instructed specifically not to sleep in the stockings.  In addition, behavioral modification including elevation during the day will be initiated. Anti-inflammatories for pain. The patient is likely to benefit from endovenous laser ablation. I have discussed the risks and benefits of the procedure. The risks primarily include DVT, recanalization, bleeding, infection, and inability to gain access. The patient is not interested in pursing laser at this time.  She will continue with conservative therapy.  The patient will follow up in three months to asses conservative management.  Information on chronic venous insufficiency and compression stockings was given to the patient. The patient was instructed to call the office in the interim if any worsening edema or ulcerations to the legs, feet or toes occurs. The patient expresses their understanding. Patient to follow up PRN  2. Chronic venous insufficiency - New As above.  3. Type 2 diabetes mellitus without complication, without long-term current use of insulin ([facility]) - Stable Encouraged good control as its slows the progression of atherosclerotic disease   Current Outpatient Prescriptions on File Prior to Visit  Medication Sig Dispense Refill  . acebutolol (SECTRAL) 200 MG capsule TAKE 2 CAPSULES BY MOUTH EVERY MORNING & 1 CAPSULE EVERY EVENING 270 capsule 2  . aspirin 81 MG tablet Take 81 mg by mouth daily.    . calcium carbonate (OS-CAL) 1250 MG chewable tablet Chew 1 tablet by mouth 2 (two) times daily.    . cephALEXin (KEFLEX) 500 MG capsule Take 1 capsule (500 mg total)  by mouth 2 (two) times daily. 14 capsule 0  . esomeprazole (NEXIUM) 40 MG capsule TAKE ONE CAPSULE BY MOUTH TWICE A DAY 180 capsule 3  . losartan (COZAAR) 25 MG tablet TAKE 1 TABLET (25 MG TOTAL) BY MOUTH DAILY. 90 tablet 3  . magnesium oxide (MAG-OX) 400 MG tablet Take 400 mg by mouth daily.    . nitroGLYCERIN (NITROSTAT) 0.4 MG SL tablet Place 1 tablet (0.4 mg total) under the tongue every 5 (five) minutes as needed for chest pain. May repeat x 1.  If persistent pain, call 911 25 tablet 0  . Omega-3 Fatty Acids (FISH OIL TRIPLE STRENGTH) 1400 MG CAPS Take 1 tablet by mouth daily.    . sertraline (ZOLOFT) 25 MG tablet TAKE  1 TABLET BY MOUTH DAILY 90 tablet 1  . simvastatin (ZOCOR) 40 MG tablet TAKE 1 TABLET BY MOUTH AT BEDTIME 90 tablet 2  . TACLONEX external suspension   2   No current facility-administered medications on file prior to visit.     There are no Patient Instructions on file for this visit. No Follow-up on file.   Ashtan Laton A Marybeth Dandy, PA-C

## 2017-03-16 ENCOUNTER — Telehealth: Payer: Self-pay | Admitting: Internal Medicine

## 2017-03-16 NOTE — Telephone Encounter (Signed)
Left pt message asking to call Allison back directly at [phone] to schedule AWV. Thanks! °

## 2017-03-17 ENCOUNTER — Other Ambulatory Visit: Payer: Self-pay | Admitting: Internal Medicine

## 2017-04-18 ENCOUNTER — Other Ambulatory Visit (INDEPENDENT_AMBULATORY_CARE_PROVIDER_SITE_OTHER): Payer: Medicare Other

## 2017-04-18 ENCOUNTER — Ambulatory Visit (INDEPENDENT_AMBULATORY_CARE_PROVIDER_SITE_OTHER): Payer: Medicare Other

## 2017-04-18 VITALS — BP 138/70 | HR 66 | Temp 97.8°F | Resp 14 | Ht 60.0 in | Wt 154.0 lb

## 2017-04-18 DIAGNOSIS — I10 Essential (primary) hypertension: Secondary | ICD-10-CM

## 2017-04-18 DIAGNOSIS — Z0000 Encounter for general adult medical examination without abnormal findings: Secondary | ICD-10-CM | POA: Diagnosis not present

## 2017-04-18 DIAGNOSIS — E119 Type 2 diabetes mellitus without complications: Secondary | ICD-10-CM

## 2017-04-18 DIAGNOSIS — E7800 Pure hypercholesterolemia, unspecified: Secondary | ICD-10-CM

## 2017-04-18 LAB — BASIC METABOLIC PANEL
BUN: 12 mg/dL (ref 6–23)
CALCIUM: 9.5 mg/dL (ref 8.4–10.5)
CHLORIDE: 102 meq/L (ref 96–112)
CO2: 33 meq/L — AB (ref 19–32)
CREATININE: 0.99 mg/dL (ref 0.40–1.20)
GFR: 56.89 mL/min — ABNORMAL LOW (ref >60.00)
Glucose, Bld: 116 mg/dL — ABNORMAL HIGH (ref 70–99)
Potassium: 4.6 mEq/L (ref 3.5–5.1)
Sodium: 139 mEq/L (ref 135–145)

## 2017-04-18 LAB — HEMOGLOBIN A1C: Hgb A1c MFr Bld: 6.7 % — ABNORMAL HIGH (ref 4.6–6.5)

## 2017-04-18 LAB — HEPATIC FUNCTION PANEL
ALT: 12 U/L (ref 0–35)
AST: 18 U/L (ref 0–37)
Albumin: 4 g/dL (ref 3.5–5.2)
Alkaline Phosphatase: 63 U/L (ref 39–117)
BILIRUBIN DIRECT: 0.2 mg/dL (ref 0.0–0.3)
BILIRUBIN TOTAL: 0.6 mg/dL (ref 0.2–1.2)
Total Protein: 6.8 g/dL (ref 6.0–8.3)

## 2017-04-18 LAB — LIPID PANEL
CHOLESTEROL: 137 mg/dL (ref 0–200)
HDL: 39.4 mg/dL (ref >39.00)
LDL CALC: 71 mg/dL (ref 0–99)
NONHDL: 98.02
TRIGLYCERIDES: 134 mg/dL (ref 0.0–149.0)
Total CHOL/HDL Ratio: 3
VLDL: 26.8 mg/dL (ref 0.0–40.0)

## 2017-04-18 LAB — TSH: TSH: 0.99 u[IU]/mL (ref 0.35–4.50)

## 2017-04-18 NOTE — Progress Notes (Signed)
Care was provided under my supervision. I agree with the management as indicated in the note.  Amandeep Hogston DO  

## 2017-04-18 NOTE — Progress Notes (Signed)
Subjective:   Teresa Wade is a 83 y.o. female who presents for an Initial Medicare Annual Wellness Visit.  Review of Systems    No ROS.  Medicare Wellness Visit.  Cardiac Risk Factors include: advanced age (>42men, >31 women);hypertension;obesity (BMI >30kg/m2);diabetes mellitus     Objective:    Today's Vitals   04/18/17 0913  BP: 138/70  Pulse: 66  Resp: 14  Temp: 97.8 F (36.6 C)  TempSrc: Oral  SpO2: 98%  Weight: 154 lb (69.9 kg)  Height: 5' (1.524 m)   Body mass index is 30.08 kg/m.   Current Medications (verified) Outpatient Encounter Prescriptions as of 04/18/2017  Medication Sig  . acebutolol (SECTRAL) 200 MG capsule TAKE 2 CAPSULES BY MOUTH EVERY MORNING & 1 CAPSULE EVERY EVENING  . aspirin 81 MG tablet Take 81 mg by mouth daily.  . calcium carbonate (OS-CAL) 1250 MG chewable tablet Chew 1 tablet by mouth 2 (two) times daily.  Marland Kitchen esomeprazole (NEXIUM) 40 MG capsule TAKE ONE CAPSULE BY MOUTH TWICE A DAY  . losartan (COZAAR) 25 MG tablet TAKE 1 TABLET (25 MG TOTAL) BY MOUTH DAILY.  . magnesium oxide (MAG-OX) 400 MG tablet Take 400 mg by mouth daily.  . nitroGLYCERIN (NITROSTAT) 0.4 MG SL tablet Place 1 tablet (0.4 mg total) under the tongue every 5 (five) minutes as needed for chest pain. May repeat x 1.  If persistent pain, call 911  . Omega-3 Fatty Acids (FISH OIL TRIPLE STRENGTH) 1400 MG CAPS Take 1 tablet by mouth daily.  . sertraline (ZOLOFT) 25 MG tablet TAKE 1 TABLET BY MOUTH DAILY  . simvastatin (ZOCOR) 40 MG tablet TAKE 1 TABLET BY MOUTH AT BEDTIME  . TACLONEX external suspension   . [DISCONTINUED] cephALEXin (KEFLEX) 500 MG capsule Take 1 capsule (500 mg total) by mouth 2 (two) times daily.   No facility-administered encounter medications on file as of 04/18/2017.     Allergies (verified) Clarithromycin; Lodine [etodolac]; Minocycline; Naproxen sodium; Rofecoxib; Vibramycin [doxycycline calcium]; and Mobic [meloxicam]   History: Past Medical  History:  Diagnosis Date  . Abnormal chest CT    right hilar cyst  . Anxiety   . Emphysema of lung ([place])   . Fibrocystic disease of breast   . GERD (gastroesophageal reflux disease)   . History of thrombocytopenia   . Hypercholesterolemia   . Hyperglycemia   . Hypertension   . Osteoarthritis   . Osteoporosis    s/p Actonel, Reclast (last 2011)  . Psoriasis    Past Surgical History:  Procedure Laterality Date  . ABDOMINAL HYSTERECTOMY  1980  . BREAST EXCISIONAL BIOPSY  1976   benign   Family History  Problem Relation Age of Onset  . Heart disease Mother   . Hypertension Mother   . Mental illness Unknown        sibling, suicide  . Colon cancer Sister   . Sleep apnea Brother    Social History   Occupational History  . Not on file.   Social History Main Topics  . Smoking status: Former Research scientist (life sciences)  . Smokeless tobacco: Never Used  . Alcohol use No  . Drug use: No  . Sexual activity: No    Tobacco Counseling Counseling given: Not Answered   Activities of Daily Living In your present state of health, do you have any difficulty performing the following activities: 04/18/2017  Hearing? Y  Vision? N  Difficulty concentrating or making decisions? N  Walking or climbing stairs? N  Dressing or  bathing? N  Doing errands, shopping? N  Preparing Food and eating ? N  Using the Toilet? N  In the past six months, have you accidently leaked urine? N  Do you have problems with loss of bowel control? Y  Managing your Medications? N  Managing your Finances? N  Housekeeping or managing your Housekeeping? N  Some recent data might be hidden    Immunizations and Health Maintenance Immunization History  Administered Date(s) Administered  . Influenza Split 09/10/2013  . Influenza,inj,Quad PF,36+ Mos 07/25/2014  . Influenza-Unspecified 08/30/2015  . Pneumococcal Conjugate-13 03/19/2014  . Td 04/11/2013   Health Maintenance Due  Topic Date Due  . DEXA SCAN  01/02/1999  .  PNA vac Low Risk Adult (2 of 2 - PPSV23) 03/20/2015  . FOOT EXAM  12/04/2016  . OPHTHALMOLOGY EXAM  12/28/2016    Patient Care Team: Einar Pheasant, MD as PCP - General (Internal Medicine)  Indicate any recent Medical Services you may have received from other than [facility] providers in the past year (date may be approximate).     Assessment:   This is a routine wellness examination for Teresa Wade. The goal of the wellness visit is to assist the patient how to close the gaps in care and create a preventative care plan for the patient.   Osteoporosis reviewed.  Medications reviewed; taking without issues or barriers.  Safety issues reviewed; smoke detectors in the home.Firearms locked up in the home. Wears seatbelts when driving or riding with others. Patient does wear sunscreen or protective clothing when in direct sunlight. No violence in the home.  Depression- PHQ 2 &9 complete.  No signs/symptoms or verbal communication regarding little pleasure in doing things, feeling down, depressed or hopeless. No changes in sleeping, energy, eating, concentrating.  No thoughts of self harm or harm towards others.  Time spent on this topic is 10 minutes.   Patient is alert, normal appearance, oriented to person/place/and time. Correctly identified the president of the [place], recall of 3/3 words, and performing simple calculations.  Patient displays appropriate judgement and can read correct time from watch face.  No new identified risk were noted.  No failures at ADL's or IADL's.   BMI- discussed the importance of a healthy diet, water intake and exercise. Educational material provided.   24 hour diet recall: Breakfast: yogurt  Lunch: Toast, fruit Dinner: Toast, potato Snack: 3 saltine crackers Daily fluid intake: 0 cups of caffeine, 5 cups of water  HTN- followed by PCP.  Dental- every six months.  Sleep patterns- Sleeps 6-7 hours at night.  Wakes feeling rested.  Naps during the  day.  Pneumovax 23 vaccine and Dexa Scan deferred per patient preference.  Educational material provided.  Patient Concerns: Diarrhea for the last 4 days.  None today. Blood pressure reading 140/70 at home. No bleeding, no pain, no dizziness.  Mindful of staying hydrated.  Bland diet. Follow up appointment scheduled with PCP tomorrow.  Labs completed today. Urine to be completed tomorrow.  Hearing/Vision screen Hearing Screening Comments: Followed by [facility] Hearing aid, bilateral Vision Screening Comments: Followed by [facility] Wears corrective lenses Last OV 2017 Cataract extraction, bilateral Visual acuity not assessed per patient preference since they have regular follow up with the ophthalmologist  Dietary issues and exercise activities discussed: Current Exercise Habits: Structured exercise class, Type of exercise: strength training/weights;stretching;walking, Time (Minutes): 60, Frequency (Times/Week): 4, Weekly Exercise (Minutes/Week): 240, Intensity: Mild  Goals    . Healthy Lifestyle  Stay active  Low carb foods Stay hydrated    . Reduce sugar intake      Depression Screen PHQ 2/9 Scores 04/18/2017 01/12/2017 12/05/2015 11/26/2014 04/16/2013 01/12/2013  PHQ - 2 Score 0 0 0 0 0 0  PHQ- 9 Score 0 - - - - -    Fall Risk Fall Risk  04/18/2017 01/12/2017 12/05/2015 11/26/2014 04/16/2013  Falls in the past year? No No No No No    Cognitive Function:     6CIT Screen 04/18/2017  What Year? 0 points  What month? 0 points  What time? 0 points  Count back from 20 0 points  Months in reverse 0 points  Repeat phrase 0 points  Total Score 0    Screening Tests Health Maintenance  Topic Date Due  . DEXA SCAN  01/02/1999  . PNA vac Low Risk Adult (2 of 2 - PPSV23) 03/20/2015  . FOOT EXAM  12/04/2016  . OPHTHALMOLOGY EXAM  12/28/2016  . HEMOGLOBIN A1C  06/07/2017  . MAMMOGRAM  06/28/2017  . INFLUENZA VACCINE  06/29/2017  . TETANUS/TDAP   04/12/2023      Plan:    End of life planning; Advance aging; Advanced directives discussed. Copy of current HCPOA/Living Will requested.    I have personally reviewed and noted the following in the patient's chart:   . Medical and social history . Use of alcohol, tobacco or illicit drugs  . Current medications and supplements . Functional ability and status . Nutritional status . Physical activity . Advanced directives . List of other physicians . Hospitalizations, surgeries, and ER visits in previous 12 months . Vitals . Screenings to include cognitive, depression, and falls . Referrals and appointments  In addition, I have reviewed and discussed with patient certain preventive protocols, quality metrics, and best practice recommendations. A written personalized care plan for preventive services as well as general preventive health recommendations were provided to patient.     Varney Biles, LPN   2/99/2426

## 2017-04-18 NOTE — Addendum Note (Signed)
Addended by: Leeanne Rio on: 04/18/2017 10:58 AM   Modules accepted: Orders

## 2017-04-18 NOTE — Patient Instructions (Addendum)
  Ms. Wenner , Thank you for taking time to come for your Medicare Wellness Visit. I appreciate your ongoing commitment to your health goals. Please review the following plan we discussed and let me know if I can assist you in the future.   Follow up with Dr. Nicki Reaper as needed.    Bring a copy of your [place] and/or Living Will to be scanned into chart.  Have a great day!  These are the goals we discussed: Goals    . Healthy Lifestyle          Stay active  Low carb foods Stay hydrated    . Reduce sugar intake       This is a list of the screening recommended for you and due dates:  Health Maintenance  Topic Date Due  . DEXA scan (bone density measurement)  01/02/1999  . Pneumonia vaccines (2 of 2 - PPSV23) 03/20/2015  . Complete foot exam   12/04/2016  . Eye exam for diabetics  12/28/2016  . Hemoglobin A1C  06/07/2017  . Mammogram  06/28/2017  . Flu Shot  06/29/2017  . Tetanus Vaccine  04/12/2023

## 2017-04-19 ENCOUNTER — Ambulatory Visit: Payer: Medicare Other | Admitting: Internal Medicine

## 2017-04-22 ENCOUNTER — Other Ambulatory Visit
Admission: RE | Admit: 2017-04-22 | Discharge: 2017-04-22 | Disposition: A | Discharge status: Home / Self Care | Payer: Medicare Other | Source: Other Acute Inpatient Hospital | Attending: Nurse Practitioner | Admitting: Nurse Practitioner

## 2017-04-22 DIAGNOSIS — R197 Diarrhea, unspecified: Secondary | ICD-10-CM | POA: Diagnosis present

## 2017-04-22 LAB — GASTROINTESTINAL PANEL BY PCR, STOOL (REPLACES STOOL CULTURE)

## 2017-04-22 LAB — C DIFFICILE QUICK SCREEN W PCR REFLEX
C DIFFICILE (CDIFF) TOXIN: NEGATIVE
C Diff antigen: NEGATIVE
C Diff interpretation: NOT DETECTED

## 2017-04-26 ENCOUNTER — Telehealth: Payer: Self-pay | Admitting: Internal Medicine

## 2017-04-26 NOTE — Telephone Encounter (Signed)
Pt has already been resched

## 2017-04-26 NOTE — Telephone Encounter (Signed)
Pt left a voicemail stating that she got cut off for rescheduling an appt with Dr. Nicki Reaper from last week due to another health issue.

## 2017-04-27 NOTE — Telephone Encounter (Signed)
Seen 5 21 18 

## 2017-05-05 ENCOUNTER — Other Ambulatory Visit: Payer: Self-pay | Admitting: Internal Medicine

## 2017-05-31 ENCOUNTER — Ambulatory Visit (INDEPENDENT_AMBULATORY_CARE_PROVIDER_SITE_OTHER): Payer: Medicare Other | Admitting: Internal Medicine

## 2017-05-31 ENCOUNTER — Encounter: Payer: Self-pay | Admitting: Internal Medicine

## 2017-05-31 VITALS — BP 136/68 | HR 63 | Temp 98.3°F | Resp 12 | Ht 60.0 in | Wt 156.0 lb

## 2017-05-31 DIAGNOSIS — I10 Essential (primary) hypertension: Secondary | ICD-10-CM

## 2017-05-31 DIAGNOSIS — R739 Hyperglycemia, unspecified: Secondary | ICD-10-CM

## 2017-05-31 DIAGNOSIS — E119 Type 2 diabetes mellitus without complications: Secondary | ICD-10-CM | POA: Diagnosis not present

## 2017-05-31 DIAGNOSIS — Z23 Encounter for immunization: Secondary | ICD-10-CM

## 2017-05-31 DIAGNOSIS — K219 Gastro-esophageal reflux disease without esophagitis: Secondary | ICD-10-CM

## 2017-05-31 DIAGNOSIS — E669 Obesity, unspecified: Secondary | ICD-10-CM

## 2017-05-31 DIAGNOSIS — Z87898 Personal history of other specified conditions: Secondary | ICD-10-CM | POA: Diagnosis not present

## 2017-05-31 DIAGNOSIS — E7800 Pure hypercholesterolemia, unspecified: Secondary | ICD-10-CM | POA: Diagnosis not present

## 2017-05-31 LAB — MICROALBUMIN / CREATININE URINE RATIO
CREATININE, U: 94.2 mg/dL
MICROALB/CREAT RATIO: 0.7 mg/g (ref 0.0–30.0)

## 2017-05-31 NOTE — Progress Notes (Signed)
Pre-visit discussion using our clinic review tool. No additional management support is needed unless otherwise documented below in the visit note.  

## 2017-05-31 NOTE — Progress Notes (Signed)
Patient ID: Teresa Wade, female   DOB: Jan 25, 1932, 83 y.o.   MRN: [id number]   Subjective:    Patient ID: Teresa Wade, female    DOB: [birth year], 83 y.o.   MRN: [id number]  HPI  Patient here for a scheduled follow up.  She was recently evaluated for persistent diarrhea.  Saw GI.  Stool studies negative.  Notes reviewed.  She is now eating fiber wafers.  Also taking probiotics.  States her bowels are doing better.  No diarrhea now.  States she feels much better.  No chest pain.  No sob.  No acid reflux.  No abdominal pain.  Formed stool.     Past Medical History:  Diagnosis Date  . Abnormal chest CT    right hilar cyst  . Anxiety   . Emphysema of lung ([place])   . Fibrocystic disease of breast   . GERD (gastroesophageal reflux disease)   . History of thrombocytopenia   . Hypercholesterolemia   . Hyperglycemia   . Hypertension   . Osteoarthritis   . Osteoporosis    s/p Actonel, Reclast (last 2011)  . Psoriasis    Past Surgical History:  Procedure Laterality Date  . ABDOMINAL HYSTERECTOMY  1980  . BREAST EXCISIONAL BIOPSY  1976   benign   Family History  Problem Relation Age of Onset  . Heart disease Mother   . Hypertension Mother   . Mental illness Unknown        sibling, suicide  . Colon cancer Sister   . Sleep apnea Brother    Social History   Social History  . Marital status: Married    Spouse name: N/A  . Number of children: 1  . Years of education: N/A   Social History Main Topics  . Smoking status: Former Research scientist (life sciences)  . Smokeless tobacco: Never Used  . Alcohol use No  . Drug use: No  . Sexual activity: No   Other Topics Concern  . None   Social History Narrative  . None    Outpatient Encounter Prescriptions as of 05/31/2017  Medication Sig  . acebutolol (SECTRAL) 200 MG capsule TAKE 2 CAPSULES BY MOUTH EVERY MORNING & 1 CAPSULE EVERY EVENING  . aspirin 81 MG tablet Take 81 mg by mouth daily.  . calcium carbonate (OS-CAL) 1250 MG chewable tablet  Chew 1 tablet by mouth 2 (two) times daily.  Marland Kitchen esomeprazole (NEXIUM) 40 MG capsule TAKE ONE CAPSULE BY MOUTH TWICE A DAY  . losartan (COZAAR) 25 MG tablet TAKE 1 TABLET (25 MG TOTAL) BY MOUTH DAILY.  . magnesium oxide (MAG-OX) 400 MG tablet Take 400 mg by mouth daily.  . nitroGLYCERIN (NITROSTAT) 0.4 MG SL tablet Place 1 tablet (0.4 mg total) under the tongue every 5 (five) minutes as needed for chest pain. May repeat x 1.  If persistent pain, call 911  . sertraline (ZOLOFT) 25 MG tablet TAKE 1 TABLET BY MOUTH DAILY  . simvastatin (ZOCOR) 40 MG tablet TAKE 1 TABLET BY MOUTH AT BEDTIME  . TACLONEX external suspension   . [DISCONTINUED] Omega-3 Fatty Acids (FISH OIL TRIPLE STRENGTH) 1400 MG CAPS Take 1 tablet by mouth daily.   No facility-administered encounter medications on file as of 05/31/2017.     Review of Systems  Constitutional: Negative for appetite change and unexpected weight change.  HENT: Negative for congestion and sinus pressure.   Respiratory: Negative for cough, chest tightness and shortness of breath.   Cardiovascular: Negative for chest pain, palpitations and  leg swelling.  Gastrointestinal: Negative for abdominal pain, nausea and vomiting.       Diarrhea resolved.   Genitourinary: Negative for difficulty urinating and dysuria.  Musculoskeletal: Negative for back pain and joint swelling.  Skin: Negative for color change and rash.  Neurological: Negative for dizziness, light-headedness and headaches.  Psychiatric/Behavioral: Negative for agitation and dysphoric mood.       Objective:     Blood pressure rechecked by me:  132/72  Physical Exam  Constitutional: She appears well-developed and well-nourished. No distress.  HENT:  Nose: Nose normal.  Mouth/Throat: Oropharynx is clear and moist.  Neck: Neck supple. No thyromegaly present.  Cardiovascular: Normal rate and regular rhythm.   Pulmonary/Chest: Breath sounds normal. No respiratory distress. She has no wheezes.   Abdominal: Soft. Bowel sounds are normal. There is no tenderness.  Musculoskeletal: She exhibits no edema or tenderness.  Lymphadenopathy:    She has no cervical adenopathy.  Skin: No rash noted. No erythema.  Psychiatric: She has a normal mood and affect. Her behavior is normal.    BP 136/68 (BP Location: Left Arm, Patient Position: Sitting, Cuff Size: Normal)   Pulse 63   Temp 98.3 F (36.8 C) (Oral)   Resp 12   Ht 5' (1.524 m)   Wt 156 lb (70.8 kg)   LMP 11/09/1979   SpO2 96%   BMI 30.47 kg/m  Wt Readings from Last 3 Encounters:  05/31/17 156 lb (70.8 kg)  04/18/17 154 lb (69.9 kg)  02/28/17 155 lb (70.3 kg)     Lab Results  Component Value Date   WBC 7.1 12/23/2016   HGB 12.6 12/23/2016   HCT 37.2 12/23/2016   PLT 203 12/23/2016   GLUCOSE 116 (H) 04/18/2017   CHOL 137 04/18/2017   TRIG 134.0 04/18/2017   HDL 39.40 04/18/2017   LDLCALC 71 04/18/2017   ALT 12 04/18/2017   AST 18 04/18/2017   NA 139 04/18/2017   K 4.6 04/18/2017   CL 102 04/18/2017   CREATININE 0.99 04/18/2017   BUN 12 04/18/2017   CO2 33 (H) 04/18/2017   TSH 0.99 04/18/2017   HGBA1C 6.7 (H) 04/18/2017   MICROALBUR <0.7 05/31/2017       Assessment & Plan:   Problem List Items Addressed This Visit    Diabetes ([place])    Low carb diet and exercise.  Follow met b and a1c.   Lab Results  Component Value Date   HGBA1C 6.7 (H) 04/18/2017        Relevant Orders   Microalbumin / creatinine urine ratio (Completed)   Hemoglobin O9G   Basic metabolic panel   Essential hypertension    Blood pressure under good control.  Continue same medication regimen.  Follow pressures.  Follow metabolic panel.        GERD (gastroesophageal reflux disease)    On nexium.  Controlled.        Hypercholesterolemia    Low cholesterol diet and exercise.  On simvastatin.  Follow lipid panel and liver function tests.   Lab Results  Component Value Date   CHOL 137 04/18/2017   HDL 39.40 04/18/2017    LDLCALC 71 04/18/2017   TRIG 134.0 04/18/2017   CHOLHDL 3 04/18/2017        Relevant Orders   Hepatic function panel   Lipid panel   Hyperglycemia    Low carb diet and exercise.  Follow met b and a1c.   Lab Results  Component Value Date  HGBA1C 6.7 (H) 04/18/2017        Obesity (BMI 30-39.9)    Diet and exercise.  Follow.        Other Visit Diagnoses    Need for prophylactic vaccination against Streptococcus pneumoniae (pneumococcus)    -  Primary   Relevant Orders   Pneumococcal polysaccharide vaccine 23-valent greater than or equal to 2yo subcutaneous/IM (Completed)   History of diarrhea       Resolved.  follow.  saw GI.         Einar Pheasant, MD

## 2017-06-03 ENCOUNTER — Telehealth: Payer: Self-pay | Admitting: Internal Medicine

## 2017-06-03 ENCOUNTER — Encounter: Payer: Self-pay | Admitting: Internal Medicine

## 2017-06-03 NOTE — Assessment & Plan Note (Signed)
Diet and exercise.  Follow.  

## 2017-06-03 NOTE — Telephone Encounter (Signed)
See results note. 

## 2017-06-03 NOTE — Telephone Encounter (Signed)
Pt called back returning your call. Pt was out of town. Please advise, thank you!  Call pt @ [phone]

## 2017-06-03 NOTE — Assessment & Plan Note (Signed)
On nexium.  Controlled.   

## 2017-06-03 NOTE — Assessment & Plan Note (Signed)
Low carb diet and exercise.  Follow met b and a1c.   Lab Results  Component Value Date   HGBA1C 6.7 (H) 04/18/2017

## 2017-06-03 NOTE — Telephone Encounter (Signed)
Called patient and gave lab results. 

## 2017-06-03 NOTE — Assessment & Plan Note (Signed)
Low carb diet and exercise.  Follow met b and a1c.   Lab Results  Component Value Date   HGBA1C 6.7 (H) 04/18/2017   

## 2017-06-03 NOTE — Assessment & Plan Note (Signed)
Blood pressure under good control.  Continue same medication regimen.  Follow pressures.  Follow metabolic panel.   

## 2017-06-03 NOTE — Assessment & Plan Note (Signed)
Low cholesterol diet and exercise.  On simvastatin.  Follow lipid panel and liver function tests.   Lab Results  Component Value Date   CHOL 137 04/18/2017   HDL 39.40 04/18/2017   LDLCALC 71 04/18/2017   TRIG 134.0 04/18/2017   CHOLHDL 3 04/18/2017

## 2017-06-09 ENCOUNTER — Other Ambulatory Visit: Payer: Self-pay | Admitting: Internal Medicine

## 2017-06-09 DIAGNOSIS — Z1231 Encounter for screening mammogram for malignant neoplasm of breast: Secondary | ICD-10-CM

## 2017-06-22 ENCOUNTER — Other Ambulatory Visit: Payer: Self-pay | Admitting: Internal Medicine

## 2017-06-30 ENCOUNTER — Ambulatory Visit
Admission: RE | Admit: 2017-06-30 | Discharge: 2017-06-30 | Disposition: A | Discharge status: Home / Self Care | Payer: Medicare Other | Source: Ambulatory Visit | Attending: Internal Medicine | Admitting: Internal Medicine

## 2017-06-30 DIAGNOSIS — Z1231 Encounter for screening mammogram for malignant neoplasm of breast: Secondary | ICD-10-CM | POA: Insufficient documentation

## 2017-06-30 LAB — HM MAMMOGRAPHY

## 2017-07-02 ENCOUNTER — Other Ambulatory Visit: Payer: Self-pay | Admitting: Internal Medicine

## 2017-09-01 ENCOUNTER — Other Ambulatory Visit (INDEPENDENT_AMBULATORY_CARE_PROVIDER_SITE_OTHER): Payer: Medicare Other

## 2017-09-01 DIAGNOSIS — E119 Type 2 diabetes mellitus without complications: Secondary | ICD-10-CM | POA: Diagnosis not present

## 2017-09-01 DIAGNOSIS — E7800 Pure hypercholesterolemia, unspecified: Secondary | ICD-10-CM

## 2017-09-01 LAB — LIPID PANEL
CHOL/HDL RATIO: 3
CHOLESTEROL: 154 mg/dL (ref 0–200)
HDL: 48.2 mg/dL (ref >39.00)
LDL CALC: 72 mg/dL (ref 0–99)
NonHDL: 105.89
TRIGLYCERIDES: 170 mg/dL — AB (ref 0.0–149.0)
VLDL: 34 mg/dL (ref 0.0–40.0)

## 2017-09-01 LAB — HEPATIC FUNCTION PANEL
ALBUMIN: 4 g/dL (ref 3.5–5.2)
ALT: 11 U/L (ref 0–35)
AST: 17 U/L (ref 0–37)
Alkaline Phosphatase: 65 U/L (ref 39–117)
BILIRUBIN TOTAL: 0.7 mg/dL (ref 0.2–1.2)
Bilirubin, Direct: 0.1 mg/dL (ref 0.0–0.3)
Total Protein: 6.8 g/dL (ref 6.0–8.3)

## 2017-09-01 LAB — HEMOGLOBIN A1C: HEMOGLOBIN A1C: 6.6 % — AB (ref 4.6–6.5)

## 2017-09-01 LAB — BASIC METABOLIC PANEL
BUN: 21 mg/dL (ref 6–23)
CALCIUM: 9.7 mg/dL (ref 8.4–10.5)
CO2: 32 mEq/L (ref 19–32)
Chloride: 102 mEq/L (ref 96–112)
Creatinine, Ser: 1.01 mg/dL (ref 0.40–1.20)
GFR: 55.55 mL/min — AB (ref >60.00)
GLUCOSE: 124 mg/dL — AB (ref 70–99)
Potassium: 4.6 mEq/L (ref 3.5–5.1)
SODIUM: 139 meq/L (ref 135–145)

## 2017-09-02 ENCOUNTER — Ambulatory Visit (INDEPENDENT_AMBULATORY_CARE_PROVIDER_SITE_OTHER): Payer: Medicare Other | Admitting: Internal Medicine

## 2017-09-02 ENCOUNTER — Encounter: Payer: Self-pay | Admitting: Internal Medicine

## 2017-09-02 DIAGNOSIS — M810 Age-related osteoporosis without current pathological fracture: Secondary | ICD-10-CM | POA: Diagnosis not present

## 2017-09-02 DIAGNOSIS — I10 Essential (primary) hypertension: Secondary | ICD-10-CM

## 2017-09-02 DIAGNOSIS — E7800 Pure hypercholesterolemia, unspecified: Secondary | ICD-10-CM | POA: Diagnosis not present

## 2017-09-02 DIAGNOSIS — R739 Hyperglycemia, unspecified: Secondary | ICD-10-CM

## 2017-09-02 DIAGNOSIS — K219 Gastro-esophageal reflux disease without esophagitis: Secondary | ICD-10-CM | POA: Diagnosis not present

## 2017-09-02 DIAGNOSIS — E119 Type 2 diabetes mellitus without complications: Secondary | ICD-10-CM | POA: Diagnosis not present

## 2017-09-02 DIAGNOSIS — E669 Obesity, unspecified: Secondary | ICD-10-CM

## 2017-09-02 LAB — HM DIABETES FOOT EXAM

## 2017-09-02 NOTE — Progress Notes (Signed)
Patient ID: Teresa Wade, female   DOB: [birth year], 83 y.o.   MRN: [id number]   Subjective:    Patient ID: Teresa Wade, female    DOB: 1932/03/07, 83 y.o.   MRN: [id number]  HPI  Patient here for a scheduled follow up.  She is doing well.  Feels better.  Saw cardiology 07/2017.  Was having palpitations.  holter placed.  Rare PVCs and PACs noted.  No significant arrhythmia.  No chest pain.  No sob.  No acid reflux.  No abdominal pain.  Bowels moving.  Overall she feels things are stable.  Desires no further testing or intervention at this time.     Past Medical History:  Diagnosis Date  . Abnormal chest CT    right hilar cyst  . Anxiety   . Emphysema of lung ([place])   . Fibrocystic disease of breast   . GERD (gastroesophageal reflux disease)   . History of thrombocytopenia   . Hypercholesterolemia   . Hyperglycemia   . Hypertension   . Osteoarthritis   . Osteoporosis    s/p Actonel, Reclast (last 2011)  . Psoriasis    Past Surgical History:  Procedure Laterality Date  . ABDOMINAL HYSTERECTOMY  1980  . BREAST EXCISIONAL BIOPSY  1976   benign   Family History  Problem Relation Age of Onset  . Heart disease Mother   . Hypertension Mother   . Mental illness Unknown        sibling, suicide  . Colon cancer Sister   . Sleep apnea Brother    Social History   Social History  . Marital status: Married    Spouse name: N/A  . Number of children: 1  . Years of education: N/A   Social History Main Topics  . Smoking status: Former Research scientist (life sciences)  . Smokeless tobacco: Never Used  . Alcohol use No  . Drug use: No  . Sexual activity: No   Other Topics Concern  . None   Social History Narrative  . None    Outpatient Encounter Prescriptions as of 09/02/2017  Medication Sig  . acebutolol (SECTRAL) 200 MG capsule TAKE 2 CAPSULES BY MOUTH EVERY MORNING & 1 CAPSULE EVERY EVENING  . aspirin 81 MG tablet Take 81 mg by mouth daily.  . calcium carbonate (OS-CAL) 1250 MG chewable  tablet Chew 1 tablet by mouth 2 (two) times daily.  Marland Kitchen esomeprazole (NEXIUM) 40 MG capsule TAKE ONE CAPSULE BY MOUTH TWICE A DAY  . losartan (COZAAR) 25 MG tablet TAKE 1 TABLET (25 MG TOTAL) BY MOUTH DAILY.  . magnesium oxide (MAG-OX) 400 MG tablet Take 400 mg by mouth daily.  . nitroGLYCERIN (NITROSTAT) 0.4 MG SL tablet Place 1 tablet (0.4 mg total) under the tongue every 5 (five) minutes as needed for chest pain. May repeat x 1.  If persistent pain, call 911  . sertraline (ZOLOFT) 25 MG tablet TAKE 1 TABLET BY MOUTH DAILY  . simvastatin (ZOCOR) 40 MG tablet TAKE 1 TABLET BY MOUTH AT BEDTIME  . TACLONEX external suspension    No facility-administered encounter medications on file as of 09/02/2017.     Review of Systems  Constitutional: Negative for appetite change and unexpected weight change.  HENT: Negative for congestion and sinus pressure.   Respiratory: Negative for cough, chest tightness and shortness of breath.   Cardiovascular: Negative for chest pain, palpitations and leg swelling.  Gastrointestinal: Negative for abdominal pain, diarrhea, nausea and vomiting.  Genitourinary: Negative for difficulty urinating and  dysuria.  Musculoskeletal: Negative for back pain and joint swelling.  Skin: Negative for color change and rash.  Neurological: Negative for dizziness, light-headedness and headaches.  Psychiatric/Behavioral: Negative for agitation and dysphoric mood.       Objective:    Physical Exam  Constitutional: She appears well-developed and well-nourished. No distress.  HENT:  Nose: Nose normal.  Mouth/Throat: Oropharynx is clear and moist.  Neck: Neck supple. No thyromegaly present.  Cardiovascular: Normal rate and regular rhythm.   Pulmonary/Chest: Breath sounds normal. No respiratory distress. She has no wheezes.  Abdominal: Soft. Bowel sounds are normal. There is no tenderness.  Musculoskeletal: She exhibits no edema or tenderness.  Feet:  No lesions.  Intact to light  touch and pin prick.  Sensation improves as move up - mid foot and up.  DP pulses palpable and equal bilaterally.    Lymphadenopathy:    She has no cervical adenopathy.  Skin: No rash noted. No erythema.  Psychiatric: She has a normal mood and affect. Her behavior is normal.    BP 134/62 (BP Location: Left Arm, Patient Position: Sitting, Cuff Size: Normal)   Pulse 65   Temp 97.9 F (36.6 C) (Oral)   Resp 12   Ht 5' (1.524 m)   Wt 159 lb 3.2 oz (72.2 kg)   LMP 11/09/1979   SpO2 96%   BMI 31.09 kg/m  Wt Readings from Last 3 Encounters:  09/02/17 159 lb 3.2 oz (72.2 kg)  05/31/17 156 lb (70.8 kg)  04/18/17 154 lb (69.9 kg)     Lab Results  Component Value Date   WBC 7.1 12/23/2016   HGB 12.6 12/23/2016   HCT 37.2 12/23/2016   PLT 203 12/23/2016   GLUCOSE 124 (H) 09/01/2017   CHOL 154 09/01/2017   TRIG 170.0 (H) 09/01/2017   HDL 48.20 09/01/2017   LDLCALC 72 09/01/2017   ALT 11 09/01/2017   AST 17 09/01/2017   NA 139 09/01/2017   K 4.6 09/01/2017   CL 102 09/01/2017   CREATININE 1.01 09/01/2017   BUN 21 09/01/2017   CO2 32 09/01/2017   TSH 0.99 04/18/2017   HGBA1C 6.6 (H) 09/01/2017   MICROALBUR <0.7 05/31/2017    Mm Screening Breast Tom[place] Bilateral  Result Date: 06/30/2017 CLINICAL DATA:  Screening. EXAM: 2D DIGITAL SCREENING BILATERAL MAMMOGRAM WITH CAD AND ADJUNCT TOMO COMPARISON:  Previous exam(s). ACR Breast Density Category b: There are scattered areas of fibroglandular density. FINDINGS: There are no findings suspicious for malignancy. Images were processed with CAD. IMPRESSION: No mammographic evidence of malignancy. A result letter of this screening mammogram will be mailed directly to the patient. RECOMMENDATION: Screening mammogram in one year. (Code:SM-B-01Y) BI-RADS CATEGORY  1: Negative. Electronically Signed   By: Frederico Hamman M.D.   On: 06/30/2017 11:16       Assessment & Plan:   Problem List Items Addressed This Visit    Diabetes ([facility])    Low  carb diet and exercise.  Follow met b and a1c.        Relevant Orders   Hemoglobin A1c   Basic metabolic panel   Essential hypertension    Blood pressure under good control.  Continue same medication regimen.  Follow pressures.  Follow metabolic panel.        Relevant Orders   CBC with Differential/Platelet   GERD (gastroesophageal reflux disease)    Controlled on nexium.        Hypercholesterolemia    On simvastatin.  Low cholesterol  diet and exercise.  Follow lipid panel and liver function tests.        Relevant Orders   Hepatic function panel   Lipid panel   Hyperglycemia    Low carb diet and exercise.  Follow met b and a1c.        Obesity (BMI 30-39.9)    Diet and exercise.  Follow.        Osteoporosis    Follow vitamin D level.            Einar Pheasant, MD

## 2017-09-05 ENCOUNTER — Encounter: Payer: Self-pay | Admitting: Internal Medicine

## 2017-09-05 NOTE — Assessment & Plan Note (Signed)
Follow vitamin D level.  

## 2017-09-05 NOTE — Assessment & Plan Note (Signed)
On simvastatin.  Low cholesterol diet and exercise.  Follow lipid panel and liver function tests.   

## 2017-09-05 NOTE — Assessment & Plan Note (Signed)
Controlled on nexium.  

## 2017-09-05 NOTE — Assessment & Plan Note (Signed)
Diet and exercise.  Follow.  

## 2017-09-05 NOTE — Assessment & Plan Note (Signed)
Low carb diet and exercise.  Follow met b and a1c.   

## 2017-09-05 NOTE — Assessment & Plan Note (Signed)
Blood pressure under good control.  Continue same medication regimen.  Follow pressures.  Follow metabolic panel.   

## 2017-10-12 ENCOUNTER — Telehealth: Payer: Self-pay | Admitting: Internal Medicine

## 2017-10-12 NOTE — Telephone Encounter (Signed)
Patient has already spoken with her pharmacy and her medication is not part of the recall

## 2017-10-12 NOTE — Telephone Encounter (Signed)
Copied from [place] [phone]. Topic: Quick Communication - See Telephone Encounter >> Oct 12, 2017  9:05 AM Robina Ade, Helene Kelp D wrote: CRM for notification. See Telephone encounter for: 10/12/17. Patient called and said that she saw on Facebook and other media that her medication losartan (COZAAR) 25 MG tablet has a recall. She is not sure if she continues it or stop taking it. She would like to talk with provider before continue taking it today.

## 2017-12-07 ENCOUNTER — Other Ambulatory Visit: Payer: Self-pay | Admitting: Student

## 2017-12-07 DIAGNOSIS — R197 Diarrhea, unspecified: Secondary | ICD-10-CM

## 2017-12-07 DIAGNOSIS — R1084 Generalized abdominal pain: Secondary | ICD-10-CM

## 2017-12-12 ENCOUNTER — Other Ambulatory Visit
Admission: RE | Admit: 2017-12-12 | Discharge: 2017-12-12 | Disposition: A | Discharge status: Home / Self Care | Payer: Medicare Other | Source: Ambulatory Visit | Attending: Student | Admitting: Student

## 2017-12-12 DIAGNOSIS — R197 Diarrhea, unspecified: Secondary | ICD-10-CM | POA: Insufficient documentation

## 2017-12-14 LAB — CALPROTECTIN, FECAL: CALPROTECTIN, FECAL: 129 ug/g — AB (ref 0–120)

## 2017-12-16 ENCOUNTER — Ambulatory Visit
Admission: RE | Admit: 2017-12-16 | Discharge: 2017-12-16 | Disposition: A | Discharge status: Home / Self Care | Payer: Medicare Other | Source: Ambulatory Visit | Attending: Student | Admitting: Student

## 2017-12-16 ENCOUNTER — Other Ambulatory Visit: Payer: Self-pay | Admitting: Internal Medicine

## 2017-12-16 DIAGNOSIS — I700 Atherosclerosis of aorta: Secondary | ICD-10-CM | POA: Diagnosis not present

## 2017-12-16 DIAGNOSIS — R1084 Generalized abdominal pain: Secondary | ICD-10-CM | POA: Diagnosis not present

## 2017-12-16 DIAGNOSIS — R197 Diarrhea, unspecified: Secondary | ICD-10-CM | POA: Insufficient documentation

## 2017-12-16 MED ORDER — IOPAMIDOL (ISOVUE-300) INJECTION 61%
85.0000 mL | Freq: Once | INTRAVENOUS | Status: AC | PRN
  Administered 2017-12-16: 85 mL via INTRAVENOUS

## 2018-01-05 ENCOUNTER — Other Ambulatory Visit: Payer: Medicare Other

## 2018-01-06 ENCOUNTER — Other Ambulatory Visit (INDEPENDENT_AMBULATORY_CARE_PROVIDER_SITE_OTHER): Payer: Medicare Other

## 2018-01-06 ENCOUNTER — Other Ambulatory Visit: Payer: Medicare Other

## 2018-01-06 DIAGNOSIS — E119 Type 2 diabetes mellitus without complications: Secondary | ICD-10-CM | POA: Diagnosis not present

## 2018-01-06 DIAGNOSIS — E7800 Pure hypercholesterolemia, unspecified: Secondary | ICD-10-CM | POA: Diagnosis not present

## 2018-01-06 DIAGNOSIS — I10 Essential (primary) hypertension: Secondary | ICD-10-CM

## 2018-01-06 LAB — HEPATIC FUNCTION PANEL
ALT: 10 U/L (ref 0–35)
AST: 15 U/L (ref 0–37)
Albumin: 3.8 g/dL (ref 3.5–5.2)
Alkaline Phosphatase: 65 U/L (ref 39–117)
BILIRUBIN DIRECT: 0.1 mg/dL (ref 0.0–0.3)
BILIRUBIN TOTAL: 0.4 mg/dL (ref 0.2–1.2)
Total Protein: 6.6 g/dL (ref 6.0–8.3)

## 2018-01-06 LAB — BASIC METABOLIC PANEL
BUN: 14 mg/dL (ref 6–23)
CALCIUM: 9.1 mg/dL (ref 8.4–10.5)
CHLORIDE: 104 meq/L (ref 96–112)
CO2: 33 mEq/L — ABNORMAL HIGH (ref 19–32)
CREATININE: 0.89 mg/dL (ref 0.40–1.20)
GFR: 64.22 mL/min (ref >60.00)
Glucose, Bld: 122 mg/dL — ABNORMAL HIGH (ref 70–99)
Potassium: 4.3 mEq/L (ref 3.5–5.1)
Sodium: 141 mEq/L (ref 135–145)

## 2018-01-06 LAB — LIPID PANEL
CHOL/HDL RATIO: 4
CHOLESTEROL: 152 mg/dL (ref 0–200)
HDL: 38.8 mg/dL — AB (ref >39.00)
NonHDL: 113.48
TRIGLYCERIDES: 212 mg/dL — AB (ref 0.0–149.0)
VLDL: 42.4 mg/dL — AB (ref 0.0–40.0)

## 2018-01-06 LAB — CBC WITH DIFFERENTIAL/PLATELET
BASOS ABS: 50 {cells}/uL (ref 0–200)
Basophils Relative: 0.9 %
EOS ABS: 78 {cells}/uL (ref 15–500)
Eosinophils Relative: 1.4 %
HCT: 36.1 % (ref 35.0–45.0)
Hemoglobin: 12.2 g/dL (ref 11.7–15.5)
Lymphs Abs: 1663 cells/uL (ref 850–3900)
MCH: 29.4 pg (ref 27.0–33.0)
MCHC: 33.8 g/dL (ref 32.0–36.0)
MCV: 87 fL (ref 80.0–100.0)
MONOS PCT: 10.2 %
MPV: 10.3 fL (ref 7.5–12.5)
Neutro Abs: 3237 cells/uL (ref 1500–7800)
Neutrophils Relative %: 57.8 %
PLATELETS: 190 10*3/uL (ref 140–400)
RBC: 4.15 10*6/uL (ref 3.80–5.10)
RDW: 12.8 % (ref 11.0–15.0)
TOTAL LYMPHOCYTE: 29.7 %
WBC: 5.6 10*3/uL (ref 3.8–10.8)
WBCMIX: 571 {cells}/uL (ref 200–950)

## 2018-01-06 LAB — HEMOGLOBIN A1C: HEMOGLOBIN A1C: 6.5 % (ref 4.6–6.5)

## 2018-01-06 LAB — LDL CHOLESTEROL, DIRECT: LDL DIRECT: 75 mg/dL

## 2018-01-09 ENCOUNTER — Encounter: Payer: Self-pay | Admitting: Internal Medicine

## 2018-01-10 ENCOUNTER — Encounter: Payer: Self-pay | Admitting: Internal Medicine

## 2018-01-10 ENCOUNTER — Ambulatory Visit (INDEPENDENT_AMBULATORY_CARE_PROVIDER_SITE_OTHER): Payer: Medicare Other | Admitting: Internal Medicine

## 2018-01-10 DIAGNOSIS — R109 Unspecified abdominal pain: Secondary | ICD-10-CM

## 2018-01-10 DIAGNOSIS — E7800 Pure hypercholesterolemia, unspecified: Secondary | ICD-10-CM | POA: Diagnosis not present

## 2018-01-10 DIAGNOSIS — E119 Type 2 diabetes mellitus without complications: Secondary | ICD-10-CM | POA: Diagnosis not present

## 2018-01-10 DIAGNOSIS — Z0000 Encounter for general adult medical examination without abnormal findings: Secondary | ICD-10-CM

## 2018-01-10 DIAGNOSIS — I10 Essential (primary) hypertension: Secondary | ICD-10-CM | POA: Diagnosis not present

## 2018-01-10 DIAGNOSIS — Z6830 Body mass index (BMI) 30.0-30.9, adult: Secondary | ICD-10-CM | POA: Diagnosis not present

## 2018-01-10 DIAGNOSIS — K219 Gastro-esophageal reflux disease without esophagitis: Secondary | ICD-10-CM

## 2018-01-10 DIAGNOSIS — R739 Hyperglycemia, unspecified: Secondary | ICD-10-CM

## 2018-01-10 NOTE — Progress Notes (Signed)
Patient ID: Teresa Wade, female   DOB: [birth year], 84 y.o.   MRN: [id number]   Subjective:    Patient ID: Teresa Wade, female    DOB: [birth year], 84 y.o.   MRN: [id number]  HPI  Patient here for her physical exam.  She has recently been having increased abdominal pain - started 11/27/17.  Also has persistent intermittent flares of diarrhea.  Saw Dawson Bills 03/2017 and Tammi Klippel 11/2017.  Notes reviewed.  Had CT scan 12/16/17.  Unrevealing for etiology.  Was given a trial of dicyclomine.  When she was taking this tid, this helped.  She decreased to daily.  Noticed some increased discomfort.  Increased dose back up - a few days ago.  States she is not having any significant pain now.  Appears to flare intermittently.  Has decreased her po intake.  Had diarrhea last week.  No diarrhea now.  Last bm this am.  No nausea or vomiting now.  Has passed some blood two weeks ago.  Appeared to be fresh blood.  Has not noticed since.  Is planning for colonoscopy and EGD 02/2018.  Does overall appear to feel better.  Was asking if she could have her procedures before April.  States otherwise doing well.  Breathing stable.  No chest pain.     Past Medical History:  Diagnosis Date  . Abnormal chest CT    right hilar cyst  . Anxiety   . Diabetes mellitus without complication ([place])    Pt does not take meds, Diet controlled.   . Emphysema of lung ([place])   . Fibrocystic disease of breast   . GERD (gastroesophageal reflux disease)   . History of thrombocytopenia   . Hypercholesterolemia   . Hyperglycemia   . Hypertension   . Osteoarthritis   . Osteoporosis    s/p Actonel, Reclast (last 2011)  . Psoriasis    Past Surgical History:  Procedure Laterality Date  . ABDOMINAL HYSTERECTOMY  1980  . BREAST EXCISIONAL BIOPSY  1976   benign   Family History  Problem Relation Age of Onset  . Heart disease Mother   . Hypertension Mother   . Mental illness Unknown        sibling, suicide  . Colon  cancer Sister   . Sleep apnea Brother    Social History   Socioeconomic History  . Marital status: Married    Spouse name: None  . Number of children: 1  . Years of education: None  . Highest education level: None  Social Needs  . Financial resource strain: None  . Food insecurity - worry: None  . Food insecurity - inability: None  . Transportation needs - medical: None  . Transportation needs - non-medical: None  Occupational History  . None  Tobacco Use  . Smoking status: Former Research scientist (life sciences)  . Smokeless tobacco: Never Used  Substance and Sexual Activity  . Alcohol use: No    Alcohol/week: 0.0 oz  . Drug use: No  . Sexual activity: No  Other Topics Concern  . None  Social History Narrative  . None    Outpatient Encounter Medications as of 01/10/2018  Medication Sig  . acebutolol (SECTRAL) 200 MG capsule TAKE 2 CAPSULES BY MOUTH EVERY MORNING & 1 CAPSULE EVERY EVENING  . aspirin 81 MG tablet Take 81 mg by mouth daily.  . calcium carbonate (OS-CAL) 1250 MG chewable tablet Chew 1 tablet by mouth 2 (two) times daily.  . calcium elemental as carbonate (TUMS  ULTRA 1000) 400 MG chewable tablet Chew by mouth.  . dicyclomine (BENTYL) 20 MG tablet TAKE 1 TABLET (20 MG TOTAL) BY MOUTH EVERY 6 (SIX) HOURS  . esomeprazole (NEXIUM) 40 MG capsule TAKE ONE CAPSULE BY MOUTH TWICE A DAY  . GAVILYTE-N WITH FLAVOR PACK 420 g solution TAKE 4,000 MLS BY MOUTH AS DIRECTED  . ipratropium (ATROVENT) 0.03 % nasal spray INSTILL 1 TO 2 SPRAYS INTO EACH NOSTRIL UP TO 3 TIMES A DAY FOR DRAINAGE  . losartan (COZAAR) 25 MG tablet TAKE 1 TABLET (25 MG TOTAL) BY MOUTH DAILY.  . magnesium oxide (MAG-OX) 400 MG tablet Take 400 mg by mouth daily.  . nitroGLYCERIN (NITROSTAT) 0.4 MG SL tablet Place 1 tablet (0.4 mg total) under the tongue every 5 (five) minutes as needed for chest pain. May repeat x 1.  If persistent pain, call 911  . sertraline (ZOLOFT) 25 MG tablet TAKE 1 TABLET BY MOUTH DAILY  . simvastatin  (ZOCOR) 40 MG tablet TAKE 1 TABLET BY MOUTH AT BEDTIME  . TACLONEX external suspension    No facility-administered encounter medications on file as of 01/10/2018.     Review of Systems  Constitutional: Negative for fever.       Decreased po intake.    HENT: Negative for congestion and sinus pressure.   Eyes: Negative for pain and visual disturbance.  Respiratory: Negative for cough, chest tightness and shortness of breath.   Cardiovascular: Negative for chest pain, palpitations and leg swelling.  Gastrointestinal:       Abdominal pain as outlined.  Worsens intermittently.  Better now.  No vomiting or nausea now.  Diarrhea better.   Genitourinary: Negative for difficulty urinating and dysuria.  Musculoskeletal: Negative for joint swelling and myalgias.  Skin: Negative for color change and rash.  Neurological: Negative for dizziness, light-headedness and headaches.  Hematological: Negative for adenopathy. Does not bruise/bleed easily.  Psychiatric/Behavioral: Negative for agitation and dysphoric mood.       Objective:    Physical Exam  Constitutional: She is oriented to person, place, and time. She appears well-developed and well-nourished. No distress.  HENT:  Nose: Nose normal.  Mouth/Throat: Oropharynx is clear and moist.  Eyes: Right eye exhibits no discharge. Left eye exhibits no discharge. No scleral icterus.  Neck: Neck supple. No thyromegaly present.  Cardiovascular: Normal rate and regular rhythm.  Pulmonary/Chest: Breath sounds normal. No accessory muscle usage. No tachypnea. No respiratory distress. She has no decreased breath sounds. She has no wheezes. She has no rhonchi. Right breast exhibits no inverted nipple, no mass, no nipple discharge and no tenderness (no axillary adenopathy). Left breast exhibits no inverted nipple, no mass, no nipple discharge and no tenderness (no axilarry adenopathy).  Abdominal: Soft. Bowel sounds are normal. There is no tenderness.    Musculoskeletal: She exhibits no edema or tenderness.  Lymphadenopathy:    She has no cervical adenopathy.  Neurological: She is alert and oriented to person, place, and time.  Skin: Skin is warm. No rash noted. No erythema.  Psychiatric: She has a normal mood and affect. Her behavior is normal.    BP 138/70 (BP Location: Left Arm, Patient Position: Sitting, Cuff Size: Normal)   Pulse 65   Temp 98.4 F (36.9 C) (Oral)   Resp 16   Wt 157 lb 9.6 oz (71.5 kg)   LMP 11/09/1979   SpO2 98%   BMI 30.78 kg/m  Wt Readings from Last 3 Encounters:  01/10/18 157 lb 9.6 oz (71.5 kg)  09/02/17 159 lb 3.2 oz (72.2 kg)  05/31/17 156 lb (70.8 kg)     Lab Results  Component Value Date   WBC 5.6 01/06/2018   HGB 12.2 01/06/2018   HCT 36.1 01/06/2018   PLT 190 01/06/2018   GLUCOSE 122 (H) 01/06/2018   CHOL 152 01/06/2018   TRIG 212.0 (H) 01/06/2018   HDL 38.80 (L) 01/06/2018   LDLDIRECT 75.0 01/06/2018   LDLCALC 72 09/01/2017   ALT 10 01/06/2018   AST 15 01/06/2018   NA 141 01/06/2018   K 4.3 01/06/2018   CL 104 01/06/2018   CREATININE 0.89 01/06/2018   BUN 14 01/06/2018   CO2 33 (H) 01/06/2018   TSH 0.99 04/18/2017   HGBA1C 6.5 01/06/2018   MICROALBUR <0.7 05/31/2017    Ct Abdomen Pelvis W Contrast  Result Date: 12/16/2017 CLINICAL DATA:  Generalized abdominal pain for 3 weeks. EXAM: CT ABDOMEN AND PELVIS WITH CONTRAST TECHNIQUE: Multidetector CT imaging of the abdomen and pelvis was performed using the standard protocol following bolus administration of intravenous contrast. CONTRAST:  56m ISOVUE-300 IOPAMIDOL (ISOVUE-300) INJECTION 61% COMPARISON:  CT scan of Mar 29, 2012. FINDINGS: Lower chest: No acute abnormality. Hepatobiliary: No focal liver abnormality is seen. No gallstones, gallbladder wall thickening, or biliary dilatation. Pancreas: Unremarkable. No pancreatic ductal dilatation or surrounding inflammatory changes. Spleen: Normal in size without focal abnormality.  Adrenals/Urinary Tract: Adrenal glands are unremarkable. Kidneys are normal, without renal calculi, focal lesion, or hydronephrosis. Bladder is unremarkable. Stomach/Bowel: Stomach is within normal limits. Appendix appears normal. No evidence of bowel wall thickening, distention, or inflammatory changes. Vascular/Lymphatic: Aortic atherosclerosis. No enlarged abdominal or pelvic lymph nodes. Reproductive: Status post hysterectomy. No adnexal masses. Other: No abdominal wall hernia or abnormality. No abdominopelvic ascites. Musculoskeletal: No acute or significant osseous findings. IMPRESSION: Aortic atherosclerosis. No other significant abnormality seen in the abdomen or pelvis. Electronically Signed   By: JMarijo Conception M.D.   On: 12/16/2017 12:26       Assessment & Plan:   Problem List Items Addressed This Visit    Abdominal pain    Better today.  Has seen GI.  CT as outlined.  Planning for EGD and colonoscopy 02/2018.  Wants to see if can have procedures earlier.  Follow.       BMI 30.0-30.9,adult    Diet and exercise.  Follow.       Diabetes ([place]    Low carb diet and exercise.  Follow met b and a1c.        Essential hypertension    Blood pressure as outlined.  Same medication regimen.  Follow pressures.  Follow metabolic panel.        GERD (gastroesophageal reflux disease)    Controlled on current regimen.        Relevant Medications   GAVILYTE-N WITH FLAVOR PACK 420 g solution   dicyclomine (BENTYL) 20 MG tablet   calcium elemental as carbonate (TUMS ULTRA 1000) 400 MG chewable tablet   Health care maintenance    Physical today 01/10/18.  Mammogram 06/30/17 - Birads I.  Colonoscopy 2013.  Planning for f/u colonoscopy in 02/2018.       Hypercholesterolemia    On simvastatin.  Low cholesterol diet and exercise.  Follow lipid panel and liver function tests.        Hyperglycemia    Low carb diet and exercise.  Follow met b and a1c.           SEinar Pheasant MD

## 2018-01-10 NOTE — Assessment & Plan Note (Addendum)
Physical today 01/10/18.  Mammogram 06/30/17 - Birads I.  Colonoscopy 2013.  Planning for f/u colonoscopy in 02/2018.

## 2018-01-12 ENCOUNTER — Encounter: Payer: Self-pay | Admitting: Internal Medicine

## 2018-01-12 DIAGNOSIS — R109 Unspecified abdominal pain: Secondary | ICD-10-CM | POA: Insufficient documentation

## 2018-01-12 NOTE — Assessment & Plan Note (Signed)
Diet and exercise.  Follow.  

## 2018-01-12 NOTE — Assessment & Plan Note (Signed)
On simvastatin.  Low cholesterol diet and exercise.  Follow lipid panel and liver function tests.   

## 2018-01-12 NOTE — Assessment & Plan Note (Signed)
Blood pressure as outlined.  Same medication regimen.  Follow pressures.  Follow metabolic panel.  

## 2018-01-12 NOTE — Assessment & Plan Note (Signed)
Low carb diet and exercise.  Follow met b and a1c.   

## 2018-01-12 NOTE — Assessment & Plan Note (Signed)
Controlled on current regimen.   

## 2018-01-12 NOTE — Assessment & Plan Note (Signed)
Better today.  Has seen GI.  CT as outlined.  Planning for EGD and colonoscopy 02/2018.  Wants to see if can have procedures earlier.  Follow.

## 2018-01-12 NOTE — Assessment & Plan Note (Signed)
Low carb diet and exercise.  Follow met b and a1c.  

## 2018-02-02 ENCOUNTER — Other Ambulatory Visit: Payer: Self-pay | Admitting: Internal Medicine

## 2018-02-23 ENCOUNTER — Ambulatory Visit: Payer: Medicare Other | Admitting: Internal Medicine

## 2018-02-23 DIAGNOSIS — I10 Essential (primary) hypertension: Secondary | ICD-10-CM | POA: Diagnosis not present

## 2018-02-23 DIAGNOSIS — E7800 Pure hypercholesterolemia, unspecified: Secondary | ICD-10-CM

## 2018-02-23 DIAGNOSIS — R739 Hyperglycemia, unspecified: Secondary | ICD-10-CM

## 2018-02-23 DIAGNOSIS — Z6830 Body mass index (BMI) 30.0-30.9, adult: Secondary | ICD-10-CM | POA: Diagnosis not present

## 2018-02-23 DIAGNOSIS — R109 Unspecified abdominal pain: Secondary | ICD-10-CM

## 2018-02-23 DIAGNOSIS — K219 Gastro-esophageal reflux disease without esophagitis: Secondary | ICD-10-CM | POA: Diagnosis not present

## 2018-02-23 DIAGNOSIS — E119 Type 2 diabetes mellitus without complications: Secondary | ICD-10-CM

## 2018-02-23 MED ORDER — LOSARTAN POTASSIUM 50 MG PO TABS
50.0000 mg | ORAL_TABLET | Freq: Every day | ORAL | 2 refills | Status: DC
Start: 2018-02-23 — End: 2018-04-05

## 2018-02-23 NOTE — Progress Notes (Addendum)
Patient ID: Teresa Wade, female   DOB: 07-05-1932, 84 y.o.   MRN: [id number]   Subjective:    Patient ID: Teresa Wade, female    DOB: 09/06/1932, 84 y.o.   MRN: [id number]  HPI  Patient here for a scheduled follow up.  She continues to have GI issues.  Intermittent flares.  Abdominal pain.  May occur 2-3 days per week.  Will have urgency to have bm.  Some nausea.  Some persistent intermittent diarrhea.  Soft stool.  May have several bowel movements per day.  Some occasional acid reflux.  Notices more when bending over.  Some occasional emesis.  No chest pain.  No sob.  Blood pressures per her report have been elevated.  Checked her cuff today.  Runs high.     Past Medical History:  Diagnosis Date  . Abnormal chest CT    right hilar cyst  . Anxiety   . Diabetes mellitus without complication ([place])    Pt does not take meds, Diet controlled.   . Emphysema of lung ([place])   . Fibrocystic disease of breast   . GERD (gastroesophageal reflux disease)   . History of thrombocytopenia   . Hypercholesterolemia   . Hyperglycemia   . Hypertension   . Osteoarthritis   . Osteoporosis    s/p Actonel, Reclast (last 2011)  . Psoriasis    Past Surgical History:  Procedure Laterality Date  . ABDOMINAL HYSTERECTOMY  1980  . BREAST EXCISIONAL BIOPSY  1976   benign   Family History  Problem Relation Age of Onset  . Heart disease Mother   . Hypertension Mother   . Mental illness Unknown        sibling, suicide  . Colon cancer Sister   . Sleep apnea Brother    Social History   Socioeconomic History  . Marital status: Married    Spouse name: Not on file  . Number of children: 1  . Years of education: Not on file  . Highest education level: Not on file  Occupational History  . Not on file  Social Needs  . Financial resource strain: Not on file  . Food insecurity:    Worry: Not on file    Inability: Not on file  . Transportation needs:    Medical: Not on file    Non-medical: Not  on file  Tobacco Use  . Smoking status: Former Research scientist (life sciences)  . Smokeless tobacco: Never Used  Substance and Sexual Activity  . Alcohol use: No    Alcohol/week: 0.0 oz  . Drug use: No  . Sexual activity: Never  Lifestyle  . Physical activity:    Days per week: Not on file    Minutes per session: Not on file  . Stress: Not on file  Relationships  . Social connections:    Talks on phone: Not on file    Gets together: Not on file    Attends religious service: Not on file    Active member of club or organization: Not on file    Attends meetings of clubs or organizations: Not on file    Relationship status: Not on file  Other Topics Concern  . Not on file  Social History Narrative  . Not on file    Outpatient Encounter Medications as of 02/23/2018  Medication Sig  . acebutolol (SECTRAL) 200 MG capsule TAKE 2 CAPSULES BY MOUTH EVERY MORNING & 1 CAPSULE EVERY EVENING  . aspirin 81 MG tablet Take 81 mg by  mouth daily.  . calcium carbonate (OS-CAL) 1250 MG chewable tablet Chew 1 tablet by mouth 2 (two) times daily.  . calcium elemental as carbonate (TUMS ULTRA 1000) 400 MG chewable tablet Chew by mouth.  . dicyclomine (BENTYL) 20 MG tablet TAKE 1 TABLET (20 MG TOTAL) BY MOUTH EVERY 6 (SIX) HOURS  . esomeprazole (NEXIUM) 40 MG capsule TAKE ONE CAPSULE BY MOUTH TWICE A DAY  . GAVILYTE-N WITH FLAVOR PACK 420 g solution TAKE 4,000 MLS BY MOUTH AS DIRECTED  . ipratropium (ATROVENT) 0.03 % nasal spray INSTILL 1 TO 2 SPRAYS INTO EACH NOSTRIL UP TO 3 TIMES A DAY FOR DRAINAGE  . magnesium oxide (MAG-OX) 400 MG tablet Take 400 mg by mouth daily.  . nitroGLYCERIN (NITROSTAT) 0.4 MG SL tablet Place 1 tablet (0.4 mg total) under the tongue every 5 (five) minutes as needed for chest pain. May repeat x 1.  If persistent pain, call 911  . sertraline (ZOLOFT) 25 MG tablet TAKE 1 TABLET BY MOUTH DAILY  . simvastatin (ZOCOR) 40 MG tablet TAKE 1 TABLET BY MOUTH AT BEDTIME  . TACLONEX external suspension   .  [DISCONTINUED] losartan (COZAAR) 25 MG tablet TAKE 1 TABLET (25 MG TOTAL) BY MOUTH DAILY.  Marland Kitchen losartan (COZAAR) 50 MG tablet Take 1 tablet (50 mg total) by mouth daily.   No facility-administered encounter medications on file as of 02/23/2018.     Review of Systems  Constitutional: Negative for appetite change and unexpected weight change.  HENT: Negative for congestion and sinus pressure.   Respiratory: Negative for cough, chest tightness and shortness of breath.   Cardiovascular: Negative for chest pain, palpitations and leg swelling.  Gastrointestinal: Positive for abdominal pain, diarrhea and nausea.       Occasional vomiting.   Genitourinary: Negative for difficulty urinating and dysuria.  Musculoskeletal: Negative for joint swelling and myalgias.  Skin: Negative for color change and rash.  Neurological: Negative for dizziness, light-headedness and headaches.  Psychiatric/Behavioral: Negative for agitation and dysphoric mood.       Objective:     Blood pressure rechecked by me:  148/78  Physical Exam  Constitutional: She appears well-developed and well-nourished. No distress.  HENT:  Nose: Nose normal.  Mouth/Throat: Oropharynx is clear and moist.  Neck: Neck supple. No thyromegaly present.  Cardiovascular: Normal rate and regular rhythm.  Pulmonary/Chest: Breath sounds normal. No respiratory distress. She has no wheezes.  Abdominal: Soft. Bowel sounds are normal. There is no tenderness.  Musculoskeletal: She exhibits no edema or tenderness.  Lymphadenopathy:    She has no cervical adenopathy.  Skin: No rash noted. No erythema.  Psychiatric: She has a normal mood and affect. Her behavior is normal.    BP (!) 148/78   Pulse 68   Temp 97.9 F (36.6 C) (Oral)   Resp 18   Wt 157 lb (71.2 kg)   LMP 11/09/1979   SpO2 96%   BMI 30.66 kg/m  Wt Readings from Last 3 Encounters:  02/23/18 157 lb (71.2 kg)  01/10/18 157 lb 9.6 oz (71.5 kg)  09/02/17 159 lb 3.2 oz (72.2  kg)     Lab Results  Component Value Date   WBC 5.6 01/06/2018   HGB 12.2 01/06/2018   HCT 36.1 01/06/2018   PLT 190 01/06/2018   GLUCOSE 122 (H) 01/06/2018   CHOL 152 01/06/2018   TRIG 212.0 (H) 01/06/2018   HDL 38.80 (L) 01/06/2018   LDLDIRECT 75.0 01/06/2018   LDLCALC 72 09/01/2017   ALT  10 01/06/2018   AST 15 01/06/2018   NA 141 01/06/2018   K 4.3 01/06/2018   CL 104 01/06/2018   CREATININE 0.89 01/06/2018   BUN 14 01/06/2018   CO2 33 (H) 01/06/2018   TSH 0.99 04/18/2017   HGBA1C 6.5 01/06/2018   MICROALBUR <0.7 05/31/2017    Ct Abdomen Pelvis W Contrast  Result Date: 12/16/2017 CLINICAL DATA:  Generalized abdominal pain for 3 weeks. EXAM: CT ABDOMEN AND PELVIS WITH CONTRAST TECHNIQUE: Multidetector CT imaging of the abdomen and pelvis was performed using the standard protocol following bolus administration of intravenous contrast. CONTRAST:  39m ISOVUE-300 IOPAMIDOL (ISOVUE-300) INJECTION 61% COMPARISON:  CT scan of Mar 29, 2012. FINDINGS: Lower chest: No acute abnormality. Hepatobiliary: No focal liver abnormality is seen. No gallstones, gallbladder wall thickening, or biliary dilatation. Pancreas: Unremarkable. No pancreatic ductal dilatation or surrounding inflammatory changes. Spleen: Normal in size without focal abnormality. Adrenals/Urinary Tract: Adrenal glands are unremarkable. Kidneys are normal, without renal calculi, focal lesion, or hydronephrosis. Bladder is unremarkable. Stomach/Bowel: Stomach is within normal limits. Appendix appears normal. No evidence of bowel wall thickening, distention, or inflammatory changes. Vascular/Lymphatic: Aortic atherosclerosis. No enlarged abdominal or pelvic lymph nodes. Reproductive: Status post hysterectomy. No adnexal masses. Other: No abdominal wall hernia or abnormality. No abdominopelvic ascites. Musculoskeletal: No acute or significant osseous findings. IMPRESSION: Aortic atherosclerosis. No other significant abnormality seen  in the abdomen or pelvis. Electronically Signed   By: JMarijo Conception M.D.   On: 12/16/2017 12:26       Assessment & Plan:   Problem List Items Addressed This Visit    Abdominal pain    Persistent issues.  Planning for EGD 03/08/18.  Seeing GI.  Has had CT scan.  Continue PPI.  D/w GI regarding further treatment.        BMI 30.0-30.9,adult    Diet and exercise.  Follow.       Di[place]    Discussed low carb diet and exercise.  Follow met b and a1c.        Relevant Medications   losartan (COZAAR) 50 MG tablet   Essential hypertension    Blood pressures slightly elevated, but not as high as her checks.  Her cuff is reading high.  Will increase losartan to 540mq day.  Follow pressures.  Follow metabolic panel.       Relevant Medications   losartan (COZAAR) 50 MG tablet   GERD (gastroesophageal reflux disease)    Persistent acid reflux as outlined.  Continue PPI.  Seeing GI.  Planning for EGD.  D/w GI regarding further treatment.       Hypercholesterolemia    On simvastatin.  Low cholesterol dieta and exercise.  Follow lipid panel and liver function tests.        Relevant Medications   losartan (COZAAR) 50 MG tablet   Hyperglycemia    Low carb diet and exercise.  Follow met b and a1c.            SCEinar PheasantMD

## 2018-02-23 NOTE — Patient Instructions (Signed)
Change losartan from 25mg  per day to 50mg  per day.

## 2018-02-26 ENCOUNTER — Encounter: Payer: Self-pay | Admitting: Internal Medicine

## 2018-02-26 NOTE — Assessment & Plan Note (Signed)
Discussed low carb diet and exercise.  Follow met b and a1c.   

## 2018-02-26 NOTE — Assessment & Plan Note (Signed)
On simvastatin.  Low cholesterol dieta and exercise.  Follow lipid panel and liver function tests.

## 2018-02-26 NOTE — Assessment & Plan Note (Signed)
Persistent acid reflux as outlined.  Continue PPI.  Seeing GI.  Planning for EGD.  D/w GI regarding further treatment.

## 2018-02-26 NOTE — Assessment & Plan Note (Addendum)
Persistent issues.  Planning for EGD 03/08/18.  Seeing GI.  Has had CT scan.  Continue PPI.  D/w GI regarding further treatment.

## 2018-02-26 NOTE — Assessment & Plan Note (Signed)
Blood pressures slightly elevated, but not as high as her checks.  Her cuff is reading high.  Will increase losartan to 50mg  q day.  Follow pressures.  Follow metabolic panel.

## 2018-02-26 NOTE — Assessment & Plan Note (Signed)
Low carb diet and exercise.  Follow met b and a1c.   

## 2018-02-26 NOTE — Assessment & Plan Note (Signed)
Diet and exercise.  Follow.  

## 2018-03-08 ENCOUNTER — Ambulatory Visit: Admit: 2018-03-08 | Payer: Medicare Other | Admitting: Unknown Physician Specialty

## 2018-03-08 ENCOUNTER — Ambulatory Visit
Admission: RE | Admit: 2018-03-08 | Payer: Medicare Other | Source: Ambulatory Visit | Admitting: Unknown Physician Specialty

## 2018-03-08 ENCOUNTER — Encounter: Admission: RE | Payer: Self-pay | Source: Ambulatory Visit

## 2018-03-08 SURGERY — COLONOSCOPY WITH PROPOFOL
Anesthesia: General

## 2018-03-08 SURGERY — ESOPHAGOGASTRODUODENOSCOPY (EGD) WITH PROPOFOL
Anesthesia: General

## 2018-03-16 ENCOUNTER — Other Ambulatory Visit: Payer: Self-pay | Admitting: Internal Medicine

## 2018-04-04 ENCOUNTER — Ambulatory Visit: Payer: Medicare Other | Admitting: Internal Medicine

## 2018-04-05 ENCOUNTER — Encounter: Payer: Self-pay | Admitting: Internal Medicine

## 2018-04-05 ENCOUNTER — Ambulatory Visit: Payer: Medicare Other | Admitting: Internal Medicine

## 2018-04-05 VITALS — BP 150/60 | HR 65 | Temp 98.0°F | Resp 17 | Ht 60.0 in | Wt 152.4 lb

## 2018-04-05 DIAGNOSIS — R109 Unspecified abdominal pain: Secondary | ICD-10-CM | POA: Diagnosis not present

## 2018-04-05 DIAGNOSIS — Z1231 Encounter for screening mammogram for malignant neoplasm of breast: Secondary | ICD-10-CM

## 2018-04-05 DIAGNOSIS — E119 Type 2 diabetes mellitus without complications: Secondary | ICD-10-CM | POA: Diagnosis not present

## 2018-04-05 DIAGNOSIS — I10 Essential (primary) hypertension: Secondary | ICD-10-CM | POA: Diagnosis not present

## 2018-04-05 DIAGNOSIS — E7800 Pure hypercholesterolemia, unspecified: Secondary | ICD-10-CM | POA: Diagnosis not present

## 2018-04-05 DIAGNOSIS — R739 Hyperglycemia, unspecified: Secondary | ICD-10-CM | POA: Diagnosis not present

## 2018-04-05 DIAGNOSIS — K219 Gastro-esophageal reflux disease without esophagitis: Secondary | ICD-10-CM

## 2018-04-05 MED ORDER — LOSARTAN POTASSIUM 100 MG PO TABS: 100.0000 mg | ORAL_TABLET | Freq: Every day | ORAL | 1 refills | Status: DC

## 2018-04-05 NOTE — Progress Notes (Signed)
Patient ID: Teresa Wade, female   DOB: 1931-12-06, 84 y.o.   MRN: [id number]   Subjective:    Patient ID: Teresa Wade, female    DOB: [birth year], 84 y.o.   MRN: [id number]  HPI  Patient here for a scheduled follow up.  Here to f/u on her blood pressure.  She reports she is doing relatively well.  Still with some intermittent abdominal issues.  Planning for EGD and colonoscopy 05/06/18.  She is trying to stay active.  No chestpain.  No sob.  Blood pressures she reports have still been a little elevated.     Past Medical History:  Diagnosis Date  . Abnormal chest CT    right hilar cyst  . Anxiety   . Diabetes mellitus without complication ([place])    Pt does not take meds, Diet controlled.   . Emphysema of lung ([place])   . Fibrocystic disease of breast   . GERD (gastroesophageal reflux disease)   . History of thrombocytopenia   . Hypercholesterolemia   . Hyperglycemia   . Hypertension   . Osteoarthritis   . Osteoporosis    s/p Actonel, Reclast (last 2011)  . Psoriasis    Past Surgical History:  Procedure Laterality Date  . ABDOMINAL HYSTERECTOMY  1980  . BREAST EXCISIONAL BIOPSY  1976   benign   Family History  Problem Relation Age of Onset  . Heart disease Mother   . Hypertension Mother   . Mental illness Unknown        sibling, suicide  . Colon cancer Sister   . Sleep apnea Brother    Social History   Socioeconomic History  . Marital status: Married    Spouse name: Not on file  . Number of children: 1  . Years of education: Not on file  . Highest education level: Not on file  Occupational History  . Not on file  Social Needs  . Financial resource strain: Not on file  . Food insecurity:    Worry: Not on file    Inability: Not on file  . Transportation needs:    Medical: Not on file    Non-medical: Not on file  Tobacco Use  . Smoking status: Former Research scientist (life sciences)  . Smokeless tobacco: Never Used  Substance and Sexual Activity  . Alcohol use: No   Alcohol/week: 0.0 oz  . Drug use: No  . Sexual activity: Never  Lifestyle  . Physical activity:    Days per week: Not on file    Minutes per session: Not on file  . Stress: Not on file  Relationships  . Social connections:    Talks on phone: Not on file    Gets together: Not on file    Attends religious service: Not on file    Active member of club or organization: Not on file    Attends meetings of clubs or organizations: Not on file    Relationship status: Not on file  Other Topics Concern  . Not on file  Social History Narrative  . Not on file    Outpatient Encounter Medications as of 04/05/2018  Medication Sig  . acebutolol (SECTRAL) 200 MG capsule TAKE 2 CAPSULES BY MOUTH EVERY MORNING & 1 CAPSULE EVERY EVENING  . aspirin 81 MG tablet Take 81 mg by mouth daily.  . calcium carbonate (OS-CAL) 1250 MG chewable tablet Chew 1 tablet by mouth 2 (two) times daily.  . calcium elemental as carbonate (TUMS ULTRA 1000) 400 MG chewable tablet  Chew by mouth.  . dicyclomine (BENTYL) 20 MG tablet TAKE 1 TABLET (20 MG TOTAL) BY MOUTH EVERY 6 (SIX) HOURS  . esomeprazole (NEXIUM) 40 MG capsule TAKE ONE CAPSULE BY MOUTH TWICE A DAY  . GAVILYTE-N WITH FLAVOR PACK 420 g solution TAKE 4,000 MLS BY MOUTH AS DIRECTED  . ipratropium (ATROVENT) 0.03 % nasal spray INSTILL 1 TO 2 SPRAYS INTO EACH NOSTRIL UP TO 3 TIMES A DAY FOR DRAINAGE  . magnesium oxide (MAG-OX) 400 MG tablet Take 400 mg by mouth daily.  . nitroGLYCERIN (NITROSTAT) 0.4 MG SL tablet Place 1 tablet (0.4 mg total) under the tongue every 5 (five) minutes as needed for chest pain. May repeat x 1.  If persistent pain, call 911  . sertraline (ZOLOFT) 25 MG tablet TAKE 1 TABLET BY MOUTH DAILY  . simvastatin (ZOCOR) 40 MG tablet TAKE 1 TABLET BY MOUTH AT BEDTIME  . TACLONEX external suspension   . [DISCONTINUED] losartan (COZAAR) 50 MG tablet Take 1 tablet (50 mg total) by mouth daily.  Marland Kitchen losartan (COZAAR) 100 MG tablet Take 1 tablet (100 mg  total) by mouth daily.   No facility-administered encounter medications on file as of 04/05/2018.     Review of Systems  Constitutional: Negative for appetite change and unexpected weight change.  HENT: Negative for congestion and sinus pressure.   Respiratory: Negative for cough, chest tightness and shortness of breath.   Cardiovascular: Negative for chest pain and leg swelling.  Gastrointestinal: Negative for vomiting.       Intermittent abdominal discomfort.    Genitourinary: Negative for difficulty urinating and dysuria.  Musculoskeletal: Negative for joint swelling and myalgias.  Skin: Negative for color change and rash.  Neurological: Negative for dizziness and headaches.  Psychiatric/Behavioral: Negative for agitation and dysphoric mood.       Objective:    Physical Exam  Constitutional: She appears well-developed and well-nourished. No distress.  HENT:  Nose: Nose normal.  Mouth/Throat: Oropharynx is clear and moist.  Neck: Neck supple. No thyromegaly present.  Cardiovascular: Normal rate and regular rhythm.  Pulmonary/Chest: Breath sounds normal. No respiratory distress. She has no wheezes.  Abdominal: Soft. Bowel sounds are normal. There is no tenderness.  Musculoskeletal: She exhibits no edema or tenderness.  Lymphadenopathy:    She has no cervical adenopathy.  Skin: No rash noted. No erythema.  Psychiatric: She has a normal mood and affect. Her behavior is normal.    BP (!) 150/60   Pulse 65   Temp 98 F (36.7 C) (Oral)   Resp 17   Ht 5' (1.524 m)   Wt 152 lb 6 oz (69.1 kg)   LMP 11/09/1979   SpO2 97%   BMI 29.76 kg/m  Wt Readings from Last 3 Encounters:  04/05/18 152 lb 6 oz (69.1 kg)  02/23/18 157 lb (71.2 kg)  01/10/18 157 lb 9.6 oz (71.5 kg)     Lab Results  Component Value Date   WBC 5.6 01/06/2018   HGB 12.2 01/06/2018   HCT 36.1 01/06/2018   PLT 190 01/06/2018   GLUCOSE 122 (H) 01/06/2018   CHOL 152 01/06/2018   TRIG 212.0 (H)  01/06/2018   HDL 38.80 (L) 01/06/2018   LDLDIRECT 75.0 01/06/2018   LDLCALC 72 09/01/2017   ALT 10 01/06/2018   AST 15 01/06/2018   NA 141 01/06/2018   K 4.3 01/06/2018   CL 104 01/06/2018   CREATININE 0.89 01/06/2018   BUN 14 01/06/2018   CO2 33 (H) 01/06/2018  TSH 0.99 04/18/2017   HGBA1C 6.5 01/06/2018   MICROALBUR <0.7 05/31/2017    Ct Abdomen Pelvis W Contrast  Result Date: 12/16/2017 CLINICAL DATA:  Generalized abdominal pain for 3 weeks. EXAM: CT ABDOMEN AND PELVIS WITH CONTRAST TECHNIQUE: Multidetector CT imaging of the abdomen and pelvis was performed using the standard protocol following bolus administration of intravenous contrast. CONTRAST:  42m ISOVUE-300 IOPAMIDOL (ISOVUE-300) INJECTION 61% COMPARISON:  CT scan of Mar 29, 2012. FINDINGS: Lower chest: No acute abnormality. Hepatobiliary: No focal liver abnormality is seen. No gallstones, gallbladder wall thickening, or biliary dilatation. Pancreas: Unremarkable. No pancreatic ductal dilatation or surrounding inflammatory changes. Spleen: Normal in size without focal abnormality. Adrenals/Urinary Tract: Adrenal glands are unremarkable. Kidneys are normal, without renal calculi, focal lesion, or hydronephrosis. Bladder is unremarkable. Stomach/Bowel: Stomach is within normal limits. Appendix appears normal. No evidence of bowel wall thickening, distention, or inflammatory changes. Vascular/Lymphatic: Aortic atherosclerosis. No enlarged abdominal or pelvic lymph nodes. Reproductive: Status post hysterectomy. No adnexal masses. Other: No abdominal wall hernia or abnormality. No abdominopelvic ascites. Musculoskeletal: No acute or significant osseous findings. IMPRESSION: Aortic atherosclerosis. No other significant abnormality seen in the abdomen or pelvis. Electronically Signed   By: JMarijo Conception M.D.   On: 12/16/2017 12:26       Assessment & Plan:   Problem List Items Addressed This Visit    Abdominal pain    Persistent  intermittent abdominal discomfort and GI issues.  Saw GI.  Planning for EGD and colonoscopy 05/06/18.        Diabetes ([place]    Low carb diet and exercise.  Follow met b and a1c.        Relevant Medications   losartan (COZAAR) 100 MG tablet   Other Relevant Orders   Hemoglobin AU3B  Basic metabolic panel   Microalbumin / creatinine urine ratio   Essential hypertension    Blood pressure still elevated.  Increase losartan to '100mg'$  q day.  Follow pressures.  Follow metabolic panel.        Relevant Medications   losartan (COZAAR) 100 MG tablet   Other Relevant Orders   TSH   GERD (gastroesophageal reflux disease)    On nexium.  Persistent GI issues.  Saw GI.  Planning for EGD and colonoscopy 05/06/18.        Hypercholesterolemia    On simvastatin.  Low cholesterol diet and exercise.  Follow lipid panel and liver function tests.        Relevant Medications   losartan (COZAAR) 100 MG tablet   Other Relevant Orders   Lipid panel   Hepatic function panel   Hyperglycemia    Low carb diet and exercise.  Follow met b and a1c.         Other Visit Diagnoses    Breast cancer screening by mammogram    -  Primary   Relevant Orders   MM Digital Screening       SEinar Pheasant MD

## 2018-04-08 ENCOUNTER — Encounter: Payer: Self-pay | Admitting: Internal Medicine

## 2018-04-08 NOTE — Assessment & Plan Note (Signed)
On simvastatin.  Low cholesterol diet and exercise.  Follow lipid panel and liver function tests.   

## 2018-04-08 NOTE — Assessment & Plan Note (Signed)
On nexium.  Persistent GI issues.  Saw GI.  Planning for EGD and colonoscopy 05/06/18.

## 2018-04-08 NOTE — Assessment & Plan Note (Signed)
Blood pressure still elevated.  Increase losartan to 100mg  q day.  Follow pressures.  Follow metabolic panel.

## 2018-04-08 NOTE — Assessment & Plan Note (Signed)
Persistent intermittent abdominal discomfort and GI issues.  Saw GI.  Planning for EGD and colonoscopy 05/06/18.

## 2018-04-08 NOTE — Assessment & Plan Note (Signed)
Low carb diet and exercise.  Follow met b and a1c.   

## 2018-04-19 ENCOUNTER — Ambulatory Visit: Payer: Medicare Other

## 2018-04-26 ENCOUNTER — Other Ambulatory Visit
Admission: RE | Admit: 2018-04-26 | Discharge: 2018-04-26 | Disposition: A | Discharge status: Home / Self Care | Payer: Medicare Other | Source: Ambulatory Visit | Attending: Student | Admitting: Student

## 2018-04-26 DIAGNOSIS — R197 Diarrhea, unspecified: Secondary | ICD-10-CM | POA: Diagnosis present

## 2018-04-26 LAB — GASTROINTESTINAL PANEL BY PCR, STOOL (REPLACES STOOL CULTURE)
ASTROVIRUS: NOT DETECTED
Adenovirus F40/41: NOT DETECTED
CYCLOSPORA CAYETANENSIS: NOT DETECTED
Campylobacter species: NOT DETECTED
Cryptosporidium: NOT DETECTED
ENTEROTOXIGENIC E COLI (ETEC): NOT DETECTED
Entamoeba histolytica: NOT DETECTED
Enteroaggregative E coli (EAEC): NOT DETECTED
Enteropathogenic E coli (EPEC): NOT DETECTED
Giardia lamblia: NOT DETECTED
Norovirus GI/GII: NOT DETECTED
PLESIMONAS SHIGELLOIDES: NOT DETECTED
Rotavirus A: NOT DETECTED
SAPOVIRUS (I, II, IV, AND V): NOT DETECTED
SHIGA LIKE TOXIN PRODUCING E COLI (STEC): NOT DETECTED
Salmonella species: NOT DETECTED
Shigella/Enteroinvasive E coli (EIEC): NOT DETECTED
VIBRIO SPECIES: NOT DETECTED
Vibrio cholerae: NOT DETECTED
Yersinia enterocolitica: NOT DETECTED

## 2018-04-26 LAB — C DIFFICILE QUICK SCREEN W PCR REFLEX
C DIFFICLE (CDIFF) ANTIGEN: NEGATIVE
C Diff interpretation: NOT DETECTED
C Diff toxin: NEGATIVE

## 2018-04-28 LAB — CALPROTECTIN, FECAL: Calprotectin, Fecal: 363 ug/g — ABNORMAL HIGH (ref 0–120)

## 2018-04-29 ENCOUNTER — Other Ambulatory Visit: Payer: Self-pay | Admitting: Internal Medicine

## 2018-04-30 LAB — PANCREATIC ELASTASE, FECAL: Pancreatic Elastase-1, Stool: >500 ug Elast./g (ref >200)

## 2018-05-04 ENCOUNTER — Encounter: Payer: Self-pay | Admitting: *Deleted

## 2018-05-05 ENCOUNTER — Ambulatory Visit: Payer: Medicare Other | Admitting: Anesthesiology

## 2018-05-05 ENCOUNTER — Encounter
Admission: RE | Disposition: A | Discharge status: Home / Self Care | Payer: Self-pay | Source: Ambulatory Visit | Attending: Unknown Physician Specialty

## 2018-05-05 ENCOUNTER — Ambulatory Visit
Admission: RE | Admit: 2018-05-05 | Discharge: 2018-05-05 | Disposition: A | Discharge status: Home / Self Care | Payer: Medicare Other | Source: Ambulatory Visit | Attending: Unknown Physician Specialty | Admitting: Unknown Physician Specialty

## 2018-05-05 ENCOUNTER — Other Ambulatory Visit: Payer: Self-pay

## 2018-05-05 DIAGNOSIS — Z7982 Long term (current) use of aspirin: Secondary | ICD-10-CM | POA: Diagnosis not present

## 2018-05-05 DIAGNOSIS — E7800 Pure hypercholesterolemia, unspecified: Secondary | ICD-10-CM | POA: Insufficient documentation

## 2018-05-05 DIAGNOSIS — K2970 Gastritis, unspecified, without bleeding: Secondary | ICD-10-CM | POA: Insufficient documentation

## 2018-05-05 DIAGNOSIS — Z79899 Other long term (current) drug therapy: Secondary | ICD-10-CM | POA: Diagnosis not present

## 2018-05-05 DIAGNOSIS — K640 First degree hemorrhoids: Secondary | ICD-10-CM | POA: Insufficient documentation

## 2018-05-05 DIAGNOSIS — K5730 Diverticulosis of large intestine without perforation or abscess without bleeding: Secondary | ICD-10-CM | POA: Diagnosis not present

## 2018-05-05 DIAGNOSIS — Z87891 Personal history of nicotine dependence: Secondary | ICD-10-CM | POA: Insufficient documentation

## 2018-05-05 DIAGNOSIS — K31819 Angiodysplasia of stomach and duodenum without bleeding: Secondary | ICD-10-CM | POA: Diagnosis not present

## 2018-05-05 DIAGNOSIS — K219 Gastro-esophageal reflux disease without esophagitis: Secondary | ICD-10-CM | POA: Diagnosis not present

## 2018-05-05 DIAGNOSIS — I10 Essential (primary) hypertension: Secondary | ICD-10-CM | POA: Insufficient documentation

## 2018-05-05 DIAGNOSIS — F419 Anxiety disorder, unspecified: Secondary | ICD-10-CM | POA: Insufficient documentation

## 2018-05-05 DIAGNOSIS — K529 Noninfective gastroenteritis and colitis, unspecified: Secondary | ICD-10-CM | POA: Insufficient documentation

## 2018-05-05 HISTORY — DX: Gastro-esophageal reflux disease with esophagitis: K21.0

## 2018-05-05 HISTORY — DX: Prediabetes: R73.03

## 2018-05-05 HISTORY — PX: COLONOSCOPY WITH PROPOFOL: SHX5780

## 2018-05-05 HISTORY — DX: Gastro-esophageal reflux disease with esophagitis, without bleeding: K21.00

## 2018-05-05 HISTORY — DX: Palpitations: R00.2

## 2018-05-05 HISTORY — PX: ESOPHAGOGASTRODUODENOSCOPY (EGD) WITH PROPOFOL: SHX5813

## 2018-05-05 LAB — HM COLONOSCOPY

## 2018-05-05 SURGERY — COLONOSCOPY WITH PROPOFOL
Anesthesia: General

## 2018-05-05 MED ORDER — PROPOFOL 10 MG/ML IV BOLUS
INTRAVENOUS | Status: DC | PRN
  Administered 2018-05-05: 20 mg via INTRAVENOUS
  Administered 2018-05-05: 60 mg via INTRAVENOUS

## 2018-05-05 MED ORDER — SODIUM CHLORIDE 0.9 % IV SOLN
INTRAVENOUS | Status: DC
  Administered 2018-05-05 (×2): via INTRAVENOUS

## 2018-05-05 MED ORDER — PROPOFOL 500 MG/50ML IV EMUL
INTRAVENOUS | Status: DC | PRN
  Administered 2018-05-05: 130 ug/kg/min via INTRAVENOUS

## 2018-05-05 MED ORDER — FENTANYL CITRATE (PF) 100 MCG/2ML IJ SOLN
INTRAMUSCULAR | Status: AC
  Filled 2018-05-05: qty 2

## 2018-05-05 MED ORDER — LIDOCAINE 2% (20 MG/ML) 5 ML SYRINGE
INTRAMUSCULAR | Status: DC | PRN
  Administered 2018-05-05: 30 mg via INTRAVENOUS

## 2018-05-05 MED ORDER — FENTANYL CITRATE (PF) 100 MCG/2ML IJ SOLN
INTRAMUSCULAR | Status: DC | PRN
  Administered 2018-05-05 (×2): 50 ug via INTRAVENOUS

## 2018-05-05 MED ORDER — PROPOFOL 500 MG/50ML IV EMUL
INTRAVENOUS | Status: AC
  Filled 2018-05-05: qty 50

## 2018-05-05 MED ORDER — PHENYLEPHRINE HCL 10 MG/ML IJ SOLN
INTRAMUSCULAR | Status: DC | PRN
  Administered 2018-05-05: 100 ug via INTRAVENOUS

## 2018-05-05 NOTE — Op Note (Signed)
[facility] Gastroenterology Patient Name: Teresa Wade Procedure Date: 05/05/2018 10:15 AM MRN: [id number] Account #: [id number] Date of Birth: [birth year] Admit Type: Outpatient Age: 84 Room: [facility] ENDO ROOM 1 Gender: Female Note Status: Finalized Procedure:            Colonoscopy Indications:          Clinically significant diarrhea of unexplained origin Providers:            Manya Silvas, MD Referring MD:         Einar Pheasant, MD (Referring MD) Medicines:            Propofol per Anesthesia Complications:        No immediate complications. Procedure:            Pre-Anesthesia Assessment:                       - After reviewing the risks and benefits, the patient                        was deemed in satisfactory condition to undergo the                        procedure.                       After obtaining informed consent, the colonoscope was                        passed under direct vision. Throughout the procedure,                        the patient's blood pressure, pulse, and oxygen                        saturations were monitored continuously. The                        Colonoscope was introduced through the anus and                        advanced to the the cecum, identified by appendiceal                        orifice and ileocecal valve. The colonoscopy was                        performed without difficulty. The patient tolerated the                        procedure well. The quality of the bowel preparation                        was excellent. Findings:      Internal hemorrhoids were found during endoscopy. The hemorrhoids were       small and Grade I (internal hemorrhoids that do not prolapse).      A few small-mouthed diverticula were found in the sigmoid colon.      The exam was otherwise without abnormality. Due to significant diarrhea       and very elevated calprotectin multiple biopsies from ascending,       transverse,  descending  and sigmod area were taken and scattered over 5       bottles. The mucosa looked normal. Impression:           - Internal hemorrhoids.                       - Diverticulosis in the sigmoid colon.                       - The examination was otherwise normal.                       - No specimens collected. Recommendation:       - Await pathology results. Manya Silvas, MD 05/05/2018 11:06:14 AM This report has been signed electronically. Number of Addenda: 0 Note Initiated On: 05/05/2018 10:15 AM Scope Withdrawal Time: 0 hours 10 minutes 6 seconds  Total Procedure Duration: 0 hours 16 minutes 10 seconds       [facility]

## 2018-05-05 NOTE — Op Note (Signed)
[facility] Gastroenterology Patient Name: Teresa Wade Procedure Date: 05/05/2018 10:15 AM MRN: [id number] Account #: [id number] Date of Birth: 1932/10/21 Admit Type: Outpatient Age: 84 Room: [facility] ENDO ROOM 1 Gender: Female Note Status: Finalized Procedure:            Upper GI endoscopy Indications:          Epigastric abdominal pain Providers:            Manya Silvas, MD Referring MD:         Einar Pheasant, MD (Referring MD) Medicines:            Propofol per Anesthesia Complications:        No immediate complications. Procedure:            Pre-Anesthesia Assessment:                       - After reviewing the risks and benefits, the patient                        was deemed in satisfactory condition to undergo the                        procedure.                       After obtaining informed consent, the endoscope was                        passed under direct vision. Throughout the procedure,                        the patient's blood pressure, pulse, and oxygen                        saturations were monitored continuously. The Endoscope                        was introduced through the mouth, and advanced to the                        second part of duodenum. The upper GI endoscopy was                        accomplished without difficulty. The patient tolerated                        the procedure well. Findings:      The examined esophagus was normal. GEJ at 37cm.      Striped mildly erythematous mucosa without bleeding was found in the       gastric antrum. Biopsies were taken with a cold forceps for histology.       Biopsies were taken with a cold forceps for Helicobacter pylori testing.      The examined duodenum was normal. Impression:           - Normal esophagus.                       - Erythematous mucosa in the antrum. Biopsied.                       - Normal  examined duodenum. Recommendation:       - Await pathology results. Procedure  Code(s):    --- Professional ---                       [id number], Esophagogastroduodenoscopy, flexible, transoral;                        with biopsy, single or multiple Diagnosis Code(s):    --- Professional ---                       K31.89, Other diseases of stomach and duodenum                       R10.13, Epigastric pain CPT copyright 2017 American Medical Association. All rights reserved. The codes documented in this report are preliminary and upon coder review may  be revised to meet current compliance requirements. Manya Silvas, MD 05/05/2018 10:43:22 AM This report has been signed electronically. Number of Addenda: 0 Note Initiated On: 05/05/2018 10:15 AM      [facility]

## 2018-05-05 NOTE — Anesthesia Post-op Follow-up Note (Signed)
Anesthesia QCDR form completed.        

## 2018-05-05 NOTE — Anesthesia Preprocedure Evaluation (Signed)
Anesthesia Evaluation  Patient identified by MRN, date of birth, ID band Patient awake    Reviewed: Allergy & Precautions, H&P , NPO status , Patient's Chart, lab work & pertinent test results, reviewed documented beta blocker date and time   History of Anesthesia Complications Negative for: history of anesthetic complications  Airway Mallampati: III  TM Distance: >3 FB Neck ROM: full    Dental  (+) Dental Advidsory Given, Caps, Partial Lower, Missing Permanent bridge on top right:   Pulmonary neg pulmonary ROS, former smoker,           Cardiovascular Exercise Tolerance: Good hypertension, (-) angina(-) CAD, (-) Past MI, (-) Cardiac Stents and (-) CABG + dysrhythmias (palpitations) (-) Valvular Problems/Murmurs     Neuro/Psych negative neurological ROS  negative psych ROS   GI/Hepatic Neg liver ROS, GERD  ,  Endo/Other  diabetes  Renal/GU negative Renal ROS  negative genitourinary   Musculoskeletal   Abdominal   Peds  Hematology negative hematology ROS (+)   Anesthesia Other Findings Past Medical History: No date: Abnormal chest CT     Comment:  right hilar cyst No date: Anxiety No date: Fibrocystic disease of breast No date: GERD (gastroesophageal reflux disease) No date: History of thrombocytopenia No date: Hypercholesterolemia No date: Hyperglycemia No date: Hypertension No date: Osteoarthritis No date: Osteoporosis     Comment:  s/p Actonel, Reclast (last 2011) No date: Palpitations No date: Pre-diabetes No date: Psoriasis No date: Reflux esophagitis   Reproductive/Obstetrics negative OB ROS                             Anesthesia Physical Anesthesia Plan  ASA: III  Anesthesia Plan: General   Post-op Pain Management:    Induction:   PONV Risk Score and Plan: 3 and Propofol infusion  Airway Management Planned:   Additional Equipment:   Intra-op Plan:    Post-operative Plan:   Informed Consent: I have reviewed the patients History and Physical, chart, labs and discussed the procedure including the risks, benefits and alternatives for the proposed anesthesia with the patient or authorized representative who has indicated his/her understanding and acceptance.   Dental Advisory Given  Plan Discussed with: Anesthesiologist, CRNA and Surgeon  Anesthesia Plan Comments:         Anesthesia Quick Evaluation

## 2018-05-05 NOTE — H&P (Signed)
Primary Care Physician:  Einar Pheasant, MD Primary Gastroenterologist:  Dr. Vira Agar  Pre-Procedure History & Physical: HPI:  Teresa Wade is a 84 y.o. female is here for an endoscopy and colonoscopy.  Done for abd pain and FH colon cancer in her sister.   Past Medical History:  Diagnosis Date  . Abnormal chest CT    right hilar cyst  . Anxiety   . Fibrocystic disease of breast   . GERD (gastroesophageal reflux disease)   . History of thrombocytopenia   . Hypercholesterolemia   . Hyperglycemia   . Hypertension   . Osteoarthritis   . Osteoporosis    s/p Actonel, Reclast (last 2011)  . Palpitations   . Pre-diabetes   . Psoriasis   . Reflux esophagitis     Past Surgical History:  Procedure Laterality Date  . ABDOMINAL HYSTERECTOMY  1980  . BREAST EXCISIONAL BIOPSY  1976   benign  . THUMB AMPUTATION Left 2015    Prior to Admission medications   Medication Sig Start Date End Date Taking? Authorizing Provider  acebutolol (SECTRAL) 200 MG capsule TAKE 2 CAPSULES BY MOUTH EVERY MORNING & 1 CAPSULE EVERY EVENING 03/16/18  Yes Einar Pheasant, MD  clobetasol ointment (TEMOVATE) 3.29 % Apply 1 application topically 2 (two) times daily.   Yes [provider]  dicyclomine (BENTYL) 20 MG tablet TAKE 1 TABLET (20 MG TOTAL) BY MOUTH EVERY 6 (SIX) HOURS 12/07/17  Yes [provider]  esomeprazole (NEXIUM) 40 MG capsule TAKE ONE CAPSULE BY MOUTH TWICE A DAY 03/17/17  Yes Scott, Randell Patient, MD  ipratropium (ATROVENT) 0.03 % nasal spray INSTILL 1 TO 2 SPRAYS INTO EACH NOSTRIL UP TO 3 TIMES A DAY FOR DRAINAGE 12/09/17  Yes [provider]  losartan (COZAAR) 100 MG tablet Take 1 tablet (100 mg total) by mouth daily. 04/05/18  Yes Einar Pheasant, MD  nitroGLYCERIN (NITROSTAT) 0.4 MG SL tablet Place 1 tablet (0.4 mg total) under the tongue every 5 (five) minutes as needed for chest pain. May repeat x 1.  If persistent pain, call 911 03/11/15  Yes Einar Pheasant, MD   simvastatin (ZOCOR) 40 MG tablet TAKE 1 TABLET BY MOUTH AT BEDTIME 05/01/18  Yes Einar Pheasant, MD  aspirin 81 MG tablet Take 81 mg by mouth daily.    [provider]  calcium carbonate (OS-CAL) 1250 MG chewable tablet Chew 1 tablet by mouth 2 (two) times daily.    [provider]  calcium elemental as carbonate (TUMS ULTRA 1000) 400 MG chewable tablet Chew by mouth.    [provider]  GAVILYTE-N WITH FLAVOR PACK 420 g solution TAKE 4,000 MLS BY MOUTH AS DIRECTED 12/23/17   [provider]  magnesium oxide (MAG-OX) 400 MG tablet Take 400 mg by mouth daily.    [provider]  sertraline (ZOLOFT) 25 MG tablet TAKE 1 TABLET BY MOUTH DAILY 12/16/17   Einar Pheasant, MD  [facility] external suspension  11/05/14   [provider]    Allergies as of 03/31/2018 - Review Complete 02/26/2018  Allergen Reaction Noted  . Clarithromycin  11/08/2012  . Lodine [etodolac] Diarrhea 11/07/2012  . Minocycline Other (See Comments) 11/08/2012  . Naproxen sodium  06/22/2010  . Rofecoxib  06/22/2010  . Vibramycin [doxycycline calcium] Other (See Comments) 11/07/2012  . Mobic [meloxicam] Rash 11/07/2012    Family History  Problem Relation Age of Onset  . Heart disease Mother   . Hypertension Mother   . Mental illness Unknown  sibling, suicide  . Colon cancer Sister   . Sleep apnea Brother     Social History   Socioeconomic History  . Marital status: Married    Spouse name: Not on file  . Number of children: 1  . Years of education: Not on file  . Highest education level: Not on file  Occupational History  . Not on file  Social Needs  . Financial resource strain: Not on file  . Food insecurity:    Worry: Not on file    Inability: Not on file  . Transportation needs:    Medical: Not on file    Non-medical: Not on file  Tobacco Use  . Smoking status: Former Smoker    Last attempt to quit: 1999    Years since quitting: 20.4  .  Smokeless tobacco: Never Used  Substance and Sexual Activity  . Alcohol use: No    Alcohol/week: 0.0 oz  . Drug use: No  . Sexual activity: Never  Lifestyle  . Physical activity:    Days per week: Not on file    Minutes per session: Not on file  . Stress: Not on file  Relationships  . Social connections:    Talks on phone: Not on file    Gets together: Not on file    Attends religious service: Not on file    Active member of club or organization: Not on file    Attends meetings of clubs or organizations: Not on file    Relationship status: Not on file  . Intimate partner violence:    Fear of current or ex partner: Not on file    Emotionally abused: Not on file    Physically abused: Not on file    Forced sexual activity: Not on file  Other Topics Concern  . Not on file  Social History Narrative  . Not on file    Review of Systems: See HPI, otherwise negative ROS  Physical Exam: BP (!) 121/54   Pulse 66   Temp (!) 97 F (36.1 C)   Resp 14   Ht 5' (1.524 m)   Wt 65.3 kg (144 lb)   LMP 11/09/1979   SpO2 99%   BMI 28.12 kg/m  General:   Alert,  pleasant and cooperative in NAD Head:  Normocephalic and atraumatic. Neck:  Supple; no masses or thyromegaly. Lungs:  Clear throughout to auscultation.    Heart:  Regular rate and rhythm. Abdomen:  Soft, nontender and nondistended. Normal bowel sounds, without guarding, and without rebound.   Neurologic:  Alert and  oriented x4;  grossly normal neurologically.  Impression/Plan: Teresa Wade is here for an endoscopy and colonoscopy to be performed for abdominal pain and FH colon cancer in sister.  Risks, benefits, limitations, and alternatives regarding  endoscopy and colonoscopy have been reviewed with the patient.  Questions have been answered.  All parties agreeable.   Gaylyn Cheers, MD  05/05/2018, 3:15 PM

## 2018-05-05 NOTE — Transfer of Care (Signed)
Immediate Anesthesia Transfer of Care Note  Patient: Teresa Wade  Procedure(s) Performed: COLONOSCOPY WITH PROPOFOL (N/A ) ESOPHAGOGASTRODUODENOSCOPY (EGD) WITH PROPOFOL (N/A )  Patient Location: PACU and Endoscopy Unit  Anesthesia Type:General  Level of Consciousness: awake, drowsy and patient cooperative  Airway & Oxygen Therapy: Patient Spontanous Breathing and Patient connected to nasal cannula oxygen  Post-op Assessment: Report given to RN and Post -op Vital signs reviewed and stable  Post vital signs: Reviewed and stable  Last Vitals:  Vitals Value Taken Time  BP 97/52 05/05/2018 11:07 AM  Temp 36.1 C 05/05/2018 11:06 AM  Pulse 66 05/05/2018 11:07 AM  Resp 14 05/05/2018 11:07 AM  SpO2 99 % 05/05/2018 11:07 AM  Vitals shown include unvalidated device data.  Last Pain:  Vitals:   05/05/18 1106  TempSrc: Tympanic         Complications: No apparent anesthesia complications

## 2018-05-07 ENCOUNTER — Other Ambulatory Visit: Payer: Self-pay | Admitting: Internal Medicine

## 2018-05-08 ENCOUNTER — Encounter: Payer: Self-pay | Admitting: Unknown Physician Specialty

## 2018-05-08 NOTE — Anesthesia Postprocedure Evaluation (Signed)
Anesthesia Post Note  Patient: Teresa Wade  Procedure(s) Performed: COLONOSCOPY WITH PROPOFOL (N/A ) ESOPHAGOGASTRODUODENOSCOPY (EGD) WITH PROPOFOL (N/A )  Patient location during evaluation: Endoscopy Anesthesia Type: General Level of consciousness: awake and alert Pain management: pain level controlled Vital Signs Assessment: post-procedure vital signs reviewed and stable Respiratory status: spontaneous breathing, nonlabored ventilation, respiratory function stable and patient connected to nasal cannula oxygen Cardiovascular status: blood pressure returned to baseline and stable Postop Assessment: no apparent nausea or vomiting Anesthetic complications: no     Last Vitals:  Vitals:   05/05/18 1108 05/05/18 1117  BP: (!) 97/52 (!) 121/54  Pulse: 66   Resp: 14   Temp: (!) 36.1 C   SpO2: 99%     Last Pain:  Vitals:   05/05/18 1127  TempSrc:   PainSc: 0-No pain                 Martha Clan

## 2018-05-09 LAB — SURGICAL PATHOLOGY

## 2018-05-10 ENCOUNTER — Other Ambulatory Visit: Payer: Self-pay | Admitting: Internal Medicine

## 2018-05-22 ENCOUNTER — Ambulatory Visit: Payer: Medicare Other

## 2018-05-22 ENCOUNTER — Telehealth: Payer: Self-pay | Admitting: *Deleted

## 2018-05-22 NOTE — Telephone Encounter (Signed)
Copied from [place] [id number]. Topic: Quick Communication - Appointment Cancellation >> May 22, 2018  8:42 AM Lennox Solders wrote: Patient called to cancel appointment scheduled for obrien-blaney . Patient {HAS rescheduled their appointment. Pt rsc due to upset stomach  Route to department's PEC pool.

## 2018-05-23 ENCOUNTER — Other Ambulatory Visit: Payer: Self-pay | Admitting: Student

## 2018-05-23 ENCOUNTER — Other Ambulatory Visit (HOSPITAL_COMMUNITY): Payer: Self-pay | Admitting: Student

## 2018-05-23 DIAGNOSIS — R1013 Epigastric pain: Secondary | ICD-10-CM

## 2018-05-23 DIAGNOSIS — R112 Nausea with vomiting, unspecified: Secondary | ICD-10-CM

## 2018-05-23 DIAGNOSIS — R1011 Right upper quadrant pain: Secondary | ICD-10-CM

## 2018-05-24 ENCOUNTER — Telehealth: Payer: Self-pay | Admitting: Internal Medicine

## 2018-05-24 NOTE — Telephone Encounter (Signed)
Copied from [place] [phone]. Topic: Quick Communication - See Telephone Encounter >> May 24, 2018  8:34 AM Bea Graff, NT wrote: CRM for notification. See Telephone encounter for: 05/24/18. Pt calling and states that the medication that Dr. Dillard Essex office put her on for her colitis, budesonide, is keeping her up at night. She called her office yesterday and was advised to call Dr. Lars Mage office to request something to help her to sleep. [id number] [address], [place] [phone] (Phone) [phone] (Fax)

## 2018-05-24 NOTE — Telephone Encounter (Signed)
Please advise 

## 2018-05-24 NOTE — Telephone Encounter (Signed)
Patient is going to try melatonin

## 2018-05-24 NOTE — Telephone Encounter (Signed)
I would not recommend prescription for sleep.   She has tried melatonin in the past.  I would recommend restarting melatonin and make sure taking it 2 -3 hours before bed.  If persistent problems with the medication side effects, would recommend notifying GI.

## 2018-05-24 NOTE — Telephone Encounter (Signed)
LMTCB

## 2018-05-24 NOTE — Telephone Encounter (Signed)
Is there anything that you suggest to help her sleep?

## 2018-05-29 ENCOUNTER — Encounter
Admission: RE | Admit: 2018-05-29 | Discharge: 2018-05-29 | Disposition: A | Discharge status: Home / Self Care | Payer: Medicare Other | Source: Ambulatory Visit | Attending: Student | Admitting: Student

## 2018-05-29 ENCOUNTER — Ambulatory Visit
Admission: RE | Admit: 2018-05-29 | Discharge: 2018-05-29 | Disposition: A | Discharge status: Home / Self Care | Payer: Medicare Other | Source: Ambulatory Visit | Attending: Student | Admitting: Student

## 2018-05-29 DIAGNOSIS — R1013 Epigastric pain: Secondary | ICD-10-CM | POA: Diagnosis not present

## 2018-05-29 DIAGNOSIS — K760 Fatty (change of) liver, not elsewhere classified: Secondary | ICD-10-CM | POA: Diagnosis not present

## 2018-05-29 DIAGNOSIS — R112 Nausea with vomiting, unspecified: Secondary | ICD-10-CM | POA: Diagnosis not present

## 2018-05-29 DIAGNOSIS — R1011 Right upper quadrant pain: Secondary | ICD-10-CM | POA: Insufficient documentation

## 2018-05-29 MED ORDER — TECHNETIUM TC 99M MEBROFENIN IV KIT
4.9600 | PACK | Freq: Once | INTRAVENOUS | Status: AC | PRN
  Administered 2018-05-29: 4.96 via INTRAVENOUS

## 2018-05-31 ENCOUNTER — Other Ambulatory Visit: Payer: Self-pay | Admitting: Student

## 2018-05-31 DIAGNOSIS — R1013 Epigastric pain: Secondary | ICD-10-CM

## 2018-05-31 DIAGNOSIS — R112 Nausea with vomiting, unspecified: Secondary | ICD-10-CM

## 2018-06-06 ENCOUNTER — Ambulatory Visit (INDEPENDENT_AMBULATORY_CARE_PROVIDER_SITE_OTHER): Payer: Medicare Other

## 2018-06-06 VITALS — BP 122/64 | HR 67 | Temp 98.1°F | Resp 15 | Ht 60.0 in | Wt 141.1 lb

## 2018-06-06 DIAGNOSIS — Z0000 Encounter for general adult medical examination without abnormal findings: Secondary | ICD-10-CM

## 2018-06-06 NOTE — Patient Instructions (Addendum)
  Ms. Lauricella , Thank you for taking time to come for your Medicare Wellness Visit. I appreciate your ongoing commitment to your health goals. Please review the following plan we discussed and let me know if I can assist you in the future.   These are the goals we discussed: Goals    . Increase physical activity       This is a list of the screening recommended for you and due dates:  Health Maintenance  Topic Date Due  . DEXA scan (bone density measurement)  01/02/1999  . Eye exam for diabetics  12/28/2016  . Flu Shot  06/29/2018  . Mammogram  06/30/2018  . Hemoglobin A1C  07/06/2018  . Complete foot exam   09/02/2018  . Tetanus Vaccine  04/12/2023  . Pneumonia vaccines  Completed

## 2018-06-06 NOTE — Progress Notes (Addendum)
Subjective:   Teresa Wade is a 84 y.o. female who presents for Medicare Annual (Subsequent) preventive examination.  Review of Systems:  No ROS.  Medicare Wellness Visit. Additional risk factors are reflected in the social history.  Cardiac Risk Factors include: advanced age (>77men, >6 women);diabetes mellitus;hypertension     Objective:     Vitals: BP 122/64 (BP Location: Left Arm, Patient Position: Sitting, Cuff Size: Normal)   Pulse 67   Temp 98.1 F (36.7 C) (Oral)   Resp 15   Ht 5' (1.524 m)   Wt 141 lb 1.9 oz (64 kg)   LMP 11/09/1979   SpO2 95%   BMI 27.56 kg/m   Body mass index is 27.56 kg/m.  Advanced Directives 06/06/2018 05/05/2018 04/18/2017 12/31/2016 12/23/2016 12/07/2016  Does Patient Have a Medical Advance Directive? Yes Yes Yes Yes No No  Type of Paramedic of [place];Living will Healthcare Power of [place] of [place] - -  Does patient want to make changes to medical advance directive? No - Patient declined - No - Patient declined - - -  Copy of [place] in Chart? No - copy requested No - copy requested No - copy requested - - -    Tobacco Social History   Tobacco Use  Smoking Status Former Smoker  . Last attempt to quit: 1999  . Years since quitting: 20.5  Smokeless Tobacco Never Used     Counseling given: Not Answered   Clinical Intake:  Pre-visit preparation completed: Yes  Pain : No/denies pain     Diabetes: Yes(Followed by pcp)  How often do you need to have someone help you when you read instructions, pamphlets, or other written materials from your doctor or pharmacy?: 1 - Never  Interpreter Needed?: No     Past Medical History:  Diagnosis Date  . Abnormal chest CT    right hilar cyst  . Anxiety   . Fibrocystic disease of breast   . GERD (gastroesophageal reflux disease)   . History of thrombocytopenia   . Hypercholesterolemia   .  Hyperglycemia   . Hypertension   . Osteoarthritis   . Osteoporosis    s/p Actonel, Reclast (last 2011)  . Palpitations   . Pre-diabetes   . Psoriasis   . Reflux esophagitis    Past Surgical History:  Procedure Laterality Date  . ABDOMINAL HYSTERECTOMY  1980  . BREAST EXCISIONAL BIOPSY  1976   benign  . COLONOSCOPY WITH PROPOFOL N/A 05/05/2018   Procedure: COLONOSCOPY WITH PROPOFOL;  Surgeon: Manya Silvas, MD;  Location[facility] ENDOSCOPY;  Service: Endoscopy;  Laterality: N/A;  . ESOPHAGOGASTRODUODENOSCOPY (EGD) WITH PROPOFOL N/A 05/05/2018   Procedure: ESOPHAGOGASTRODUODENOSCOPY (EGD) WITH PROPOFOL;  Surgeon: Manya Silvas, MD;  Location: [facility] ENDOSCOPY;  Service: Endoscopy;  Laterality: N/A;  . THUMB AMPUTATION Left 2015   Family History  Problem Relation Age of Onset  . Heart disease Mother   . Hypertension Mother   . Mental illness Unknown        sibling, suicide  . Colon cancer Sister   . Sleep apnea Brother    Social History   Socioeconomic History  . Marital status: Widowed    Spouse name: Not on file  . Number of children: 1  . Years of education: Not on file  . Highest education level: Not on file  Occupational History  . Not on file  Social Needs  . Emergency planning/management officer  strain: Not hard at all  . Food insecurity:    Worry: Never true    Inability: Never true  . Transportation needs:    Medical: No    Non-medical: No  Tobacco Use  . Smoking status: Former Smoker    Last attempt to quit: 1999    Years since quitting: 20.5  . Smokeless tobacco: Never Used  Substance and Sexual Activity  . Alcohol use: No    Alcohol/week: 0.0 oz  . Drug use: No  . Sexual activity: Never  Lifestyle  . Physical activity:    Days per week: Not on file    Minutes per session: Not on file  . Stress: Not on file  Relationships  . Social connections:    Talks on phone: Not on file    Gets together: Not on file    Attends religious service: Not on file    Active  member of club or organization: Not on file    Attends meetings of clubs or organizations: Not on file    Relationship status: Not on file  Other Topics Concern  . Not on file  Social History Narrative  . Not on file    Outpatient Encounter Medications as of 06/06/2018  Medication Sig  . acebutolol (SECTRAL) 200 MG capsule TAKE 2 CAPSULES BY MOUTH EVERY MORNING & 1 CAPSULE EVERY EVENING  . aspirin 81 MG tablet Take 81 mg by mouth daily.  . BUDESONIDE PO Take 3 capsules by mouth every morning.  . calcium carbonate (OS-CAL) 1250 MG chewable tablet Chew 1 tablet by mouth 2 (two) times daily.  . calcium elemental as carbonate (TUMS ULTRA 1000) 400 MG chewable tablet Chew by mouth.  . clobetasol ointment (TEMOVATE) 4.09 % Apply 1 application topically 2 (two) times daily.  Marland Kitchen esomeprazole (NEXIUM) 40 MG capsule TAKE ONE CAPSULE BY MOUTH TWICE A DAY  . GAVILYTE-N WITH FLAVOR PACK 420 g solution TAKE 4,000 MLS BY MOUTH AS DIRECTED  . ipratropium (ATROVENT) 0.03 % nasal spray INSTILL 1 TO 2 SPRAYS INTO EACH NOSTRIL UP TO 3 TIMES A DAY FOR DRAINAGE  . losartan (COZAAR) 100 MG tablet TAKE 1 TABLET BY MOUTH EVERY DAY  . magnesium oxide (MAG-OX) 400 MG tablet Take 400 mg by mouth daily.  . nitroGLYCERIN (NITROSTAT) 0.4 MG SL tablet Place 1 tablet (0.4 mg total) under the tongue every 5 (five) minutes as needed for chest pain. May repeat x 1.  If persistent pain, call 911  . sertraline (ZOLOFT) 25 MG tablet TAKE 1 TABLET BY MOUTH DAILY  . simvastatin (ZOCOR) 40 MG tablet TAKE 1 TABLET BY MOUTH AT BEDTIME  . sucralfate (CARAFATE) 1 g tablet Take 1 g by mouth 4 (four) times daily -  with meals and at bedtime.  Donita Brooks external suspension   . [DISCONTINUED] dicyclomine (BENTYL) 20 MG tablet TAKE 1 TABLET (20 MG TOTAL) BY MOUTH EVERY 6 (SIX) HOURS   No facility-administered encounter medications on file as of 06/06/2018.     Activities of Daily Living In your present state of health, do you have any  difficulty performing the following activities: 06/06/2018  Hearing? N  Vision? N  Difficulty concentrating or making decisions? N  Walking or climbing stairs? N  Dressing or bathing? N  Doing errands, shopping? N  Preparing Food and eating ? N  Using the Toilet? N  In the past six months, have you accidently leaked urine? N  Do you have problems with loss of bowel  control? N  Managing your Medications? N  Managing your Finances? N  Housekeeping or managing your Housekeeping? N  Some recent data might be hidden    Patient Care Team: Einar Pheasant, MD as PCP - General (Internal Medicine)    Assessment:   This is a routine wellness examination for Teresa Wade. The goal of the wellness visit is to assist the patient how to close the gaps in care and create a preventative care plan for the patient.   The roster of all physicians providing medical care to patient is listed in the Snapshot section of the chart.  Osteoporosis risk reviewed.    Safety issues reviewed; Smoke and carbon monoxide detectors in the home. No firearms in the home. Wears seatbelts when driving or riding with others. No violence in the home.  They do not have excessive sun exposure.  Discussed the need for sun protection: hats, long sleeves and the use of sunscreen if there is significant sun exposure.  Patient is alert, normal appearance, oriented to person/place/and time. Correctly identified the president of the [place] and recalls of 3/3 words.Performs simple calculations and can read correct time from watch face. Displays appropriate judgement.  No new identified risk were noted.  No failures at ADL's or IADL's.    BMI- discussed the importance of a healthy diet, water intake and the benefits of aerobic exercise. Educational material provided.   24 hour diet recall: Teresa Wade- every 6 months.  Hypertension-followed by pcp.  She is keeping a daily log at home and plans to bring at next scheduled  appointment.  She reports her blood pressure is usually worse as the evening progresses. Denies head aches, chest pain, blurred vision, dizziness or any other symptoms when number is raised.  Encouraged to contact pcp as needed, if these symptoms present and are continued prior to her next follow up.   Sleep patterns- Sleeps without issues.   Dexa Scan discussed.   Patient Concerns: None at this time. Follow up with PCP as needed.  Exercise Activities and Dietary recommendations Current Exercise Habits: Home exercise routine, Type of exercise: strength training/weights;walking;treadmill, Time (Minutes): 60, Frequency (Times/Week): 3, Weekly Exercise (Minutes/Week): 180, Intensity: Mild  Goals    . Increase physical activity       Fall Risk Fall Risk  06/06/2018 04/18/2017 01/12/2017 12/05/2015 11/26/2014  Falls in the past year? No No No No No   Depression Screen PHQ 2/9 Scores 06/06/2018 04/18/2017 01/12/2017 12/05/2015  PHQ - 2 Score 0 0 0 0  PHQ- 9 Score - 0 - -     Cognitive Function     6CIT Screen 06/06/2018 04/18/2017  What Year? 0 points 0 points  What month? 0 points 0 points  What time? 0 points 0 points  Count back from 20 0 points 0 points  Months in reverse 0 points 0 points  Repeat phrase 0 points 0 points  Total Score 0 0    Immunization History  Administered Date(s) Administered  . Influenza Split 09/10/2013  . Influenza,inj,Quad PF,6+ Mos 07/25/2014  . Influenza-Unspecified 08/16/2012, 08/22/2013, 07/25/2014, 08/30/2015, 08/25/2016, 08/30/2017  . Pneumococcal Conjugate-13 03/19/2014  . Pneumococcal Polysaccharide-23 05/31/2017  . Td 04/11/2013   Screening Tests Health Maintenance  Topic Date Due  . DEXA SCAN  01/02/1999  . OPHTHALMOLOGY EXAM  12/28/2016  . INFLUENZA VACCINE  06/29/2018  . MAMMOGRAM  06/30/2018  . HEMOGLOBIN A1C  07/06/2018  . FOOT EXAM  09/02/2018  . TETANUS/TDAP  04/12/2023  .  PNA vac Low Risk Adult  Completed      Plan:    End of  life planning; Advance aging; Advanced directives discussed. Copy of current HCPOA/Living Will requested.    I have personally reviewed and noted the following in the patient's chart:   . Medical and social history . Use of alcohol, tobacco or illicit drugs  . Current medications and supplements . Functional ability and status . Nutritional status . Physical activity . Advanced directives . List of other physicians . Hospitalizations, surgeries, and ER visits in previous 12 months . Vitals . Screenings to include cognitive, depression, and falls . Referrals and appointments  In addition, I have reviewed and discussed with patient certain preventive protocols, quality metrics, and best practice recommendations. A written personalized care plan for preventive services as well as general preventive health recommendations were provided to patient.     OBrien-Blaney, Jamie Hafford L, LPN  07/31/99   Reviewed above information.  Agree with assessment and plan.    Dr Nicki Reaper

## 2018-06-13 ENCOUNTER — Encounter
Admission: RE | Admit: 2018-06-13 | Discharge: 2018-06-13 | Disposition: A | Discharge status: Home / Self Care | Payer: Medicare Other | Source: Ambulatory Visit | Attending: Student | Admitting: Student

## 2018-06-13 DIAGNOSIS — R1013 Epigastric pain: Secondary | ICD-10-CM | POA: Diagnosis present

## 2018-06-13 DIAGNOSIS — R112 Nausea with vomiting, unspecified: Secondary | ICD-10-CM | POA: Diagnosis not present

## 2018-06-13 MED ORDER — TECHNETIUM TC 99M SULFUR COLLOID
2.0000 | Freq: Once | INTRAVENOUS | Status: AC | PRN
  Administered 2018-06-13: 2.53 via INTRAVENOUS

## 2018-06-16 ENCOUNTER — Other Ambulatory Visit: Payer: Self-pay | Admitting: Internal Medicine

## 2018-06-26 ENCOUNTER — Ambulatory Visit: Payer: Medicare Other | Admitting: Internal Medicine

## 2018-06-26 DIAGNOSIS — R109 Unspecified abdominal pain: Secondary | ICD-10-CM

## 2018-06-26 DIAGNOSIS — K219 Gastro-esophageal reflux disease without esophagitis: Secondary | ICD-10-CM | POA: Diagnosis not present

## 2018-06-26 DIAGNOSIS — I10 Essential (primary) hypertension: Secondary | ICD-10-CM

## 2018-06-26 DIAGNOSIS — K52832 Lymphocytic colitis: Secondary | ICD-10-CM

## 2018-06-26 DIAGNOSIS — E7800 Pure hypercholesterolemia, unspecified: Secondary | ICD-10-CM

## 2018-06-26 DIAGNOSIS — R110 Nausea: Secondary | ICD-10-CM

## 2018-06-26 DIAGNOSIS — R739 Hyperglycemia, unspecified: Secondary | ICD-10-CM

## 2018-06-26 DIAGNOSIS — E119 Type 2 diabetes mellitus without complications: Secondary | ICD-10-CM | POA: Diagnosis not present

## 2018-06-26 NOTE — Progress Notes (Signed)
Patient ID: Teresa Wade, female   DOB: 06/22/1932, 84 y.o.   MRN: [id number]   Subjective:    Patient ID: Teresa Wade, female    DOB: [birth year], 84 y.o.   MRN: [id number]  HPI  Patient here for a scheduled follow up.  Has been seeing GI for persistent problems with diarrhea and abdominal discomfort.  Colonoscopy revealed lymphocytic colitis.  On budesonide.  Also with nausea and vomiting and diagnosed with gastritis.  On nexium in am and zantac in pm. Normal gastric emptying study.  Still with flare - 1-2x/week.  Some occasional nausea.  Is eating.  No chest pain.  Breathing stable.  No blood in stool.     Past Medical History:  Diagnosis Date  . Abnormal chest CT    right hilar cyst  . Anxiety   . Fibrocystic disease of breast   . GERD (gastroesophageal reflux disease)   . History of thrombocytopenia   . Hypercholesterolemia   . Hyperglycemia   . Hypertension   . Osteoarthritis   . Osteoporosis    s/p Actonel, Reclast (last 2011)  . Palpitations   . Pre-diabetes   . Psoriasis   . Reflux esophagitis    Past Surgical History:  Procedure Laterality Date  . ABDOMINAL HYSTERECTOMY  1980  . BREAST EXCISIONAL BIOPSY  1976   benign  . COLONOSCOPY WITH PROPOFOL N/A 05/05/2018   Procedure: COLONOSCOPY WITH PROPOFOL;  Surgeon: Manya Silvas, MD;  Location: [facility] ENDOSCOPY;  Service: Endoscopy;  Laterality: N/A;  . ESOPHAGOGASTRODUODENOSCOPY (EGD) WITH PROPOFOL N/A 05/05/2018   Procedure: ESOPHAGOGASTRODUODENOSCOPY (EGD) WITH PROPOFOL;  Surgeon: Manya Silvas, MD;  Location: [facility] ENDOSCOPY;  Service: Endoscopy;  Laterality: N/A;  . THUMB AMPUTATION Left 2015   Family History  Problem Relation Age of Onset  . Heart disease Mother   . Hypertension Mother   . Mental illness Unknown        sibling, suicide  . Colon cancer Sister   . Sleep apnea Brother    Social History   Socioeconomic History  . Marital status: Widowed    Spouse name: Not on file  . Number of  children: 1  . Years of education: Not on file  . Highest education level: Not on file  Occupational History  . Not on file  Social Needs  . Financial resource strain: Not hard at all  . Food insecurity:    Worry: Never true    Inability: Never true  . Transportation needs:    Medical: No    Non-medical: No  Tobacco Use  . Smoking status: Former Smoker    Last attempt to quit: 1999    Years since quitting: 20.5  . Smokeless tobacco: Never Used  Substance and Sexual Activity  . Alcohol use: No    Alcohol/week: 0.0 oz  . Drug use: No  . Sexual activity: Never  Lifestyle  . Physical activity:    Days per week: Not on file    Minutes per session: Not on file  . Stress: Not on file  Relationships  . Social connections:    Talks on phone: Not on file    Gets together: Not on file    Attends religious service: Not on file    Active member of club or organization: Not on file    Attends meetings of clubs or organizations: Not on file    Relationship status: Not on file  Other Topics Concern  . Not on file  Social  History Narrative  . Not on file    Outpatient Encounter Medications as of 06/26/2018  Medication Sig  . acebutolol (SECTRAL) 200 MG capsule TAKE 2 CAPSULES BY MOUTH EVERY MORNING & 1 CAPSULE EVERY EVENING  . aspirin 81 MG tablet Take 81 mg by mouth daily.  . BUDESONIDE PO Take 3 capsules by mouth every morning.  . calcium carbonate (OS-CAL) 1250 MG chewable tablet Chew 1 tablet by mouth 2 (two) times daily.  . calcium elemental as carbonate (TUMS ULTRA 1000) 400 MG chewable tablet Chew by mouth.  . clobetasol ointment (TEMOVATE) 4.00 % Apply 1 application topically 2 (two) times daily.  Marland Kitchen esomeprazole (NEXIUM) 40 MG capsule TAKE ONE CAPSULE BY MOUTH TWICE A DAY  . ipratropium (ATROVENT) 0.03 % nasal spray INSTILL 1 TO 2 SPRAYS INTO EACH NOSTRIL UP TO 3 TIMES A DAY FOR DRAINAGE  . losartan (COZAAR) 100 MG tablet TAKE 1 TABLET BY MOUTH EVERY DAY  . magnesium oxide  (MAG-OX) 400 MG tablet Take 400 mg by mouth daily.  . nitroGLYCERIN (NITROSTAT) 0.4 MG SL tablet Place 1 tablet (0.4 mg total) under the tongue every 5 (five) minutes as needed for chest pain. May repeat x 1.  If persistent pain, call 911  . ranitidine (ZANTAC) 300 MG tablet TAKE 1 TABLET (300 MG TOTAL) BY MOUTH DAILY BEFORE DINNER TAKE 15-30 MINUTES BEFORE DINNER.  Marland Kitchen sertraline (ZOLOFT) 25 MG tablet TAKE 1 TABLET BY MOUTH DAILY  . simvastatin (ZOCOR) 40 MG tablet TAKE 1 TABLET BY MOUTH AT BEDTIME  . TACLONEX external suspension   . [DISCONTINUED] GAVILYTE-N WITH FLAVOR PACK 420 g solution TAKE 4,000 MLS BY MOUTH AS DIRECTED  . [DISCONTINUED] sucralfate (CARAFATE) 1 g tablet Take 1 g by mouth 4 (four) times daily -  with meals and at bedtime.   No facility-administered encounter medications on file as of 06/26/2018.     Review of Systems  Constitutional: Negative for appetite change and unexpected weight change.  HENT: Negative for congestion and sinus pressure.   Respiratory: Negative for cough, chest tightness and shortness of breath.   Cardiovascular: Negative for chest pain and palpitations.  Gastrointestinal: Positive for abdominal pain, diarrhea and nausea. Negative for vomiting.  Genitourinary: Negative for difficulty urinating and dysuria.  Musculoskeletal: Negative for joint swelling and myalgias.  Skin: Negative for color change and rash.  Neurological: Negative for dizziness, light-headedness and headaches.  Psychiatric/Behavioral: Negative for agitation and dysphoric mood.       Objective:    Physical Exam  Constitutional: She appears well-developed and well-nourished. No distress.  HENT:  Nose: Nose normal.  Mouth/Throat: Oropharynx is clear and moist.  Neck: Neck supple. No thyromegaly present.  Cardiovascular: Normal rate and regular rhythm.  Pulmonary/Chest: Breath sounds normal. No respiratory distress. She has no wheezes.  Abdominal: Soft. Bowel sounds are normal.  There is no tenderness.  Musculoskeletal: She exhibits no edema or tenderness.  Lymphadenopathy:    She has no cervical adenopathy.  Skin: No rash noted. No erythema.  Psychiatric: She has a normal mood and affect. Her behavior is normal.    BP 140/82 (BP Location: Left Arm, Patient Position: Sitting, Cuff Size: Normal)   Pulse 69   Temp 98.1 F (36.7 C) (Oral)   Resp 18   Wt 138 lb 12.8 oz (63 kg)   LMP 11/09/1979   SpO2 97%   BMI 27.11 kg/m  Wt Readings from Last 3 Encounters:  06/26/18 138 lb 12.8 oz (63 kg)  06/06/18  141 lb 1.9 oz (64 kg)  05/05/18 144 lb (65.3 kg)     Lab Results  Component Value Date   WBC 5.6 01/06/2018   HGB 12.2 01/06/2018   HCT 36.1 01/06/2018   PLT 190 01/06/2018   GLUCOSE 122 (H) 01/06/2018   CHOL 152 01/06/2018   TRIG 212.0 (H) 01/06/2018   HDL 38.80 (L) 01/06/2018   LDLDIRECT 75.0 01/06/2018   LDLCALC 72 09/01/2017   ALT 10 01/06/2018   AST 15 01/06/2018   NA 141 01/06/2018   K 4.3 01/06/2018   CL 104 01/06/2018   CREATININE 0.89 01/06/2018   BUN 14 01/06/2018   CO2 33 (H) 01/06/2018   TSH 0.99 04/18/2017   HGBA1C 6.5 01/06/2018   MICROALBUR <0.7 05/31/2017    Nm Gastric Emptying  Result Date: 06/13/2018 CLINICAL DATA:  Pain with nausea and vomiting EXAM: NUCLEAR MEDICINE GASTRIC EMPTYING SCAN TECHNIQUE: After oral ingestion of radiolabeled meal, sequential abdominal images were obtained for 4 hours. Percentage of activity emptying the stomach was calculated at 1 hour, 2 hour, 3 hour, and 4 hours. RADIOPHARMACEUTICALS:  2.53 mCi Tc-75msulfur colloid in standardized meal including egg COMPARISON:  None. FINDINGS: Expected location of the stomach in the left upper quadrant. Ingested meal empties the stomach gradually over the course of the study. 51% emptied at 1 hr ( normal >= 10%) 84% emptied at 2 hr ( normal >= 40%) 96% emptied at 3 hr ( normal >= 70%) 100% emptied at 4 hr ( normal >= 90%) IMPRESSION: Normal gastric emptying study.  Electronically Signed   By: WLowella GripIII M.D.   On: 06/13/2018 13:11       Assessment & Plan:   Problem List Items Addressed This Visit    Abdominal pain    Has been seeing GI.  On nexium daily.  Recently had zantac added in pm.  Did not tolerate carafate.  Continue f/u with GI.       Diabetes ([place]    Low carb diet and exercise.  Follow met b and a1c.        Essential hypertension    Blood pressure has been under good control.  Continue same medication regimen.  Follow pressures.  Follow metabolic panel.       GERD (gastroesophageal reflux disease)    On nexium daily secondary to insurance not covering twice a day.  Zantac in pm.  Follow.       Relevant Medications   ranitidine (ZANTAC) 300 MG tablet   Hypercholesterolemia    On simvastatin.  Low cholesterol diet and exercise.  Follow lipid panel and liver function tests.        Hyperglycemia    Low carb diet and exercise.  Follow met b and a1c.       Lymphocytic colitis    Colonoscopy - biopsy - lymphocytic colitis.  On budesonide.  Has helped some.  Continue f/u with GI.       Nausea    Persistent intermittent nausea, reflux and abdominal discomfort.  Seeing GI.  On nexium.  Zantac added in pm.  Did not tolerate carafate.  Continue f/u with GI.            SEinar Pheasant MD

## 2018-06-28 ENCOUNTER — Encounter: Payer: Self-pay | Admitting: Internal Medicine

## 2018-06-28 DIAGNOSIS — K52832 Lymphocytic colitis: Secondary | ICD-10-CM | POA: Insufficient documentation

## 2018-06-28 NOTE — Assessment & Plan Note (Signed)
Blood pressure has been under good control.  Continue same medication regimen.  Follow pressures.  Follow metabolic panel.   

## 2018-06-28 NOTE — Assessment & Plan Note (Signed)
Low carb diet and exercise.  Follow met b and a1c.   

## 2018-06-28 NOTE — Assessment & Plan Note (Signed)
Persistent intermittent nausea, reflux and abdominal discomfort.  Seeing GI.  On nexium.  Zantac added in pm.  Did not tolerate carafate.  Continue f/u with GI.

## 2018-06-28 NOTE — Assessment & Plan Note (Signed)
Low carb diet and exercise.  Follow met b and a1c.  

## 2018-06-28 NOTE — Assessment & Plan Note (Signed)
Has been seeing GI.  On nexium daily.  Recently had zantac added in pm.  Did not tolerate carafate.  Continue f/u with GI.

## 2018-06-28 NOTE — Assessment & Plan Note (Signed)
Colonoscopy - biopsy - lymphocytic colitis.  On budesonide.  Has helped some.  Continue f/u with GI.

## 2018-06-28 NOTE — Assessment & Plan Note (Signed)
On nexium daily secondary to insurance not covering twice a day.  Zantac in pm.  Follow.

## 2018-06-28 NOTE — Assessment & Plan Note (Signed)
On simvastatin.  Low cholesterol diet and exercise.  Follow lipid panel and liver function tests.   

## 2018-07-10 ENCOUNTER — Ambulatory Visit
Admission: RE | Admit: 2018-07-10 | Discharge: 2018-07-10 | Disposition: A | Discharge status: Home / Self Care | Payer: Medicare Other | Source: Ambulatory Visit | Attending: Internal Medicine | Admitting: Internal Medicine

## 2018-07-10 DIAGNOSIS — Z1231 Encounter for screening mammogram for malignant neoplasm of breast: Secondary | ICD-10-CM | POA: Insufficient documentation

## 2018-07-12 ENCOUNTER — Other Ambulatory Visit: Payer: Self-pay | Admitting: Student

## 2018-07-12 DIAGNOSIS — R112 Nausea with vomiting, unspecified: Secondary | ICD-10-CM

## 2018-07-12 DIAGNOSIS — R634 Abnormal weight loss: Secondary | ICD-10-CM

## 2018-07-12 DIAGNOSIS — R1013 Epigastric pain: Secondary | ICD-10-CM

## 2018-07-14 ENCOUNTER — Other Ambulatory Visit (INDEPENDENT_AMBULATORY_CARE_PROVIDER_SITE_OTHER): Payer: Medicare Other

## 2018-07-14 DIAGNOSIS — E119 Type 2 diabetes mellitus without complications: Secondary | ICD-10-CM

## 2018-07-14 DIAGNOSIS — I10 Essential (primary) hypertension: Secondary | ICD-10-CM | POA: Diagnosis not present

## 2018-07-14 DIAGNOSIS — E7800 Pure hypercholesterolemia, unspecified: Secondary | ICD-10-CM

## 2018-07-14 LAB — LIPID PANEL
Cholesterol: 159 mg/dL (ref 0–200)
HDL: 57 mg/dL (ref >39.00)
LDL Cholesterol: 75 mg/dL (ref 0–99)
NONHDL: 101.85
Total CHOL/HDL Ratio: 3
Triglycerides: 132 mg/dL (ref 0.0–149.0)
VLDL: 26.4 mg/dL (ref 0.0–40.0)

## 2018-07-14 LAB — BASIC METABOLIC PANEL
BUN: 22 mg/dL (ref 6–23)
CHLORIDE: 105 meq/L (ref 96–112)
CO2: 31 mEq/L (ref 19–32)
Calcium: 9.3 mg/dL (ref 8.4–10.5)
Creatinine, Ser: 1.05 mg/dL (ref 0.40–1.20)
GFR: 53 mL/min — ABNORMAL LOW (ref >60.00)
GLUCOSE: 104 mg/dL — AB (ref 70–99)
POTASSIUM: 4 meq/L (ref 3.5–5.1)
Sodium: 140 mEq/L (ref 135–145)

## 2018-07-14 LAB — HEPATIC FUNCTION PANEL
ALBUMIN: 3.8 g/dL (ref 3.5–5.2)
ALT: 14 U/L (ref 0–35)
AST: 17 U/L (ref 0–37)
Alkaline Phosphatase: 65 U/L (ref 39–117)
BILIRUBIN TOTAL: 0.5 mg/dL (ref 0.2–1.2)
Bilirubin, Direct: 0.1 mg/dL (ref 0.0–0.3)
Total Protein: 6.5 g/dL (ref 6.0–8.3)

## 2018-07-14 LAB — POCT GLYCOSYLATED HEMOGLOBIN (HGB A1C): HEMOGLOBIN A1C: 6.4 % — AB (ref 4.0–5.6)

## 2018-07-14 LAB — TSH: TSH: 1.35 u[IU]/mL (ref 0.35–4.50)

## 2018-07-14 LAB — MICROALBUMIN / CREATININE URINE RATIO
CREATININE, U: 252.6 mg/dL
MICROALB/CREAT RATIO: 2.1 mg/g (ref 0.0–30.0)
Microalb, Ur: 5.4 mg/dL — ABNORMAL HIGH (ref 0.0–1.9)

## 2018-07-15 ENCOUNTER — Encounter: Payer: Self-pay | Admitting: Internal Medicine

## 2018-07-21 ENCOUNTER — Ambulatory Visit
Admission: RE | Admit: 2018-07-21 | Discharge: 2018-07-21 | Disposition: A | Discharge status: Home / Self Care | Payer: Medicare Other | Source: Ambulatory Visit | Attending: Student | Admitting: Student

## 2018-07-21 DIAGNOSIS — K5710 Diverticulosis of small intestine without perforation or abscess without bleeding: Secondary | ICD-10-CM | POA: Insufficient documentation

## 2018-07-21 DIAGNOSIS — R1013 Epigastric pain: Secondary | ICD-10-CM | POA: Diagnosis present

## 2018-07-21 DIAGNOSIS — R634 Abnormal weight loss: Secondary | ICD-10-CM | POA: Insufficient documentation

## 2018-07-21 DIAGNOSIS — R112 Nausea with vomiting, unspecified: Secondary | ICD-10-CM | POA: Insufficient documentation

## 2018-07-21 MED ORDER — IOPAMIDOL (ISOVUE-300) INJECTION 61%
100.0000 mL | Freq: Once | INTRAVENOUS | Status: AC | PRN
  Administered 2018-07-21: 100 mL via INTRAVENOUS

## 2018-07-29 IMAGING — NM NM GASTRIC EMPTYING
1 series · 10 of 10 positions shown · non-contrast
Comparison: None.

CLINICAL DATA: Pain with nausea and vomiting

EXAM:
NUCLEAR MEDICINE GASTRIC EMPTYING SCAN
TECHNIQUE: After oral ingestion of radiolabeled meal, sequential abdominal
images were obtained for 4 hours. Percentage of activity emptying
the stomach was calculated at 1 hour, 2 hour, 3 hour, and 4 hours.
RADIOPHARMACEUTICALS:  2.53 mCi Vc-EEm sulfur colloid in
standardized meal including egg

[Series 1000: gatric statics (results) · 3.90mm/px · 5 acquisitions, 10 frames shown]
[im 1/5]
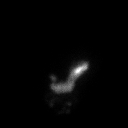
[im 1/5]
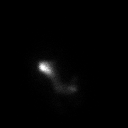
[im 2/5]
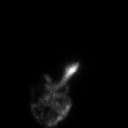
[im 2/5]
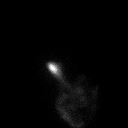
[im 3/5]
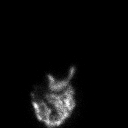
[im 3/5]
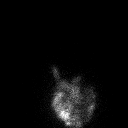
[im 4/5]
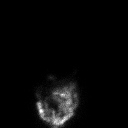
[im 4/5]
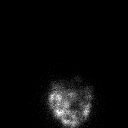
[im 5/5]
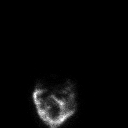
[im 5/5]
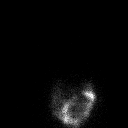

[10 of 10 positions shown; findings below may reference images not displayed]

FINDINGS: Expected location of the stomach in the left upper quadrant.
Ingested meal empties the stomach gradually over the course of the
study.

51% emptied at 1 hr ( normal >= 10%)

84% emptied at 2 hr ( normal >= 40%)

96% emptied at 3 hr ( normal >= 70%)

100% emptied at 4 hr ( normal >= 90%)
IMPRESSION: Normal gastric emptying study.

## 2018-07-31 ENCOUNTER — Other Ambulatory Visit: Payer: Self-pay | Admitting: Internal Medicine

## 2018-08-22 ENCOUNTER — Ambulatory Visit: Payer: Medicare Other | Admitting: Internal Medicine

## 2018-08-22 ENCOUNTER — Encounter: Payer: Self-pay | Admitting: Internal Medicine

## 2018-08-22 DIAGNOSIS — R109 Unspecified abdominal pain: Secondary | ICD-10-CM

## 2018-08-22 DIAGNOSIS — R739 Hyperglycemia, unspecified: Secondary | ICD-10-CM

## 2018-08-22 DIAGNOSIS — M810 Age-related osteoporosis without current pathological fracture: Secondary | ICD-10-CM

## 2018-08-22 DIAGNOSIS — I10 Essential (primary) hypertension: Secondary | ICD-10-CM

## 2018-08-22 DIAGNOSIS — E7800 Pure hypercholesterolemia, unspecified: Secondary | ICD-10-CM

## 2018-08-22 DIAGNOSIS — E119 Type 2 diabetes mellitus without complications: Secondary | ICD-10-CM | POA: Diagnosis not present

## 2018-08-22 DIAGNOSIS — K219 Gastro-esophageal reflux disease without esophagitis: Secondary | ICD-10-CM | POA: Diagnosis not present

## 2018-08-22 DIAGNOSIS — R110 Nausea: Secondary | ICD-10-CM

## 2018-08-22 DIAGNOSIS — K52832 Lymphocytic colitis: Secondary | ICD-10-CM

## 2018-08-22 NOTE — Progress Notes (Signed)
Patient ID: Teresa Wade, female   DOB: 03/02/1932, 84 y.o.   MRN: [id number]   Subjective:    Patient ID: Teresa Wade, female    DOB: [birth year], 84 y.o.   MRN: [id number]  HPI  Patient here for a scheduled follow up.  Has been seeing GI and diagnosed with lymphocytic colitis.  Colonoscopy 05/05/18 - mild active colitis throughout the colon.  Started on budesonide.  Started on ranitidine q hs and continues on nexium in the am.  Continues to have loose stool.   Due to see Dr Larose Hires - GI specialist given persistent nausea and symptoms.  No chest pain.  No sob.  She is getting back into the gym.  Doing treadmill.  Eating.     Past Medical History:  Diagnosis Date  . Abnormal chest CT    right hilar cyst  . Anxiety   . Fibrocystic disease of breast   . GERD (gastroesophageal reflux disease)   . History of thrombocytopenia   . Hypercholesterolemia   . Hyperglycemia   . Hypertension   . Osteoarthritis   . Osteoporosis    s/p Actonel, Reclast (last 2011)  . Palpitations   . Pre-diabetes   . Psoriasis   . Reflux esophagitis    Past Surgical History:  Procedure Laterality Date  . ABDOMINAL HYSTERECTOMY  1980  . BREAST EXCISIONAL BIOPSY Left 1976   benign  . COLONOSCOPY WITH PROPOFOL N/A 05/05/2018   Procedure: COLONOSCOPY WITH PROPOFOL;  Surgeon: Manya Silvas, MD;  Location: [facility] ENDOSCOPY;  Service: Endoscopy;  Laterality: N/A;  . ESOPHAGOGASTRODUODENOSCOPY (EGD) WITH PROPOFOL N/A 05/05/2018   Procedure: ESOPHAGOGASTRODUODENOSCOPY (EGD) WITH PROPOFOL;  Surgeon: Manya Silvas, MD;  Location: [facility] ENDOSCOPY;  Service: Endoscopy;  Laterality: N/A;  . THUMB AMPUTATION Left 2015   Family History  Problem Relation Age of Onset  . Heart disease Mother   . Hypertension Mother   . Mental illness Unknown        sibling, suicide  . Colon cancer Sister   . Sleep apnea Brother    Social History   Socioeconomic History  . Marital status: Widowed    Spouse name: Not on  file  . Number of children: 1  . Years of education: Not on file  . Highest education level: Not on file  Occupational History  . Not on file  Social Needs  . Financial resource strain: Not hard at all  . Food insecurity:    Worry: Never true    Inability: Never true  . Transportation needs:    Medical: No    Non-medical: No  Tobacco Use  . Smoking status: Former Smoker    Last attempt to quit: 1999    Years since quitting: 20.7  . Smokeless tobacco: Never Used  Substance and Sexual Activity  . Alcohol use: No    Alcohol/week: 0.0 standard drinks  . Drug use: No  . Sexual activity: Never  Lifestyle  . Physical activity:    Days per week: Not on file    Minutes per session: Not on file  . Stress: Not on file  Relationships  . Social connections:    Talks on phone: Not on file    Gets together: Not on file    Attends religious service: Not on file    Active member of club or organization: Not on file    Attends meetings of clubs or organizations: Not on file    Relationship status: Not on file  Other Topics Concern  . Not on file  Social History Narrative  . Not on file    Outpatient Encounter Medications as of 08/22/2018  Medication Sig  . acebutolol (SECTRAL) 200 MG capsule TAKE 2 CAPSULES BY MOUTH EVERY MORNING & 1 CAPSULE EVERY EVENING  . aspirin 81 MG tablet Take 81 mg by mouth daily.  . budesonide (ENTOCORT EC) 3 MG 24 hr capsule Take 9 mg by mouth every morning.  . BUDESONIDE PO Take 3 capsules by mouth every morning.  . calcium carbonate (OS-CAL) 1250 MG chewable tablet Chew 1 tablet by mouth 2 (two) times daily.  . calcium elemental as carbonate (TUMS ULTRA 1000) 400 MG chewable tablet Chew by mouth.  . clobetasol ointment (TEMOVATE) 0.93 % Apply 1 application topically 2 (two) times daily.  Marland Kitchen esomeprazole (NEXIUM) 40 MG capsule TAKE ONE CAPSULE BY MOUTH TWICE A DAY  . ipratropium (ATROVENT) 0.03 % nasal spray INSTILL 1 TO 2 SPRAYS INTO EACH NOSTRIL UP TO 3  TIMES A DAY FOR DRAINAGE  . losartan (COZAAR) 100 MG tablet TAKE 1 TABLET BY MOUTH EVERY DAY  . magnesium oxide (MAG-OX) 400 MG tablet Take 400 mg by mouth daily.  . nitroGLYCERIN (NITROSTAT) 0.4 MG SL tablet Place 1 tablet (0.4 mg total) under the tongue every 5 (five) minutes as needed for chest pain. May repeat x 1.  If persistent pain, call 911  . ranitidine (ZANTAC) 300 MG tablet TAKE 1 TABLET (300 MG TOTAL) BY MOUTH DAILY BEFORE DINNER TAKE 15-30 MINUTES BEFORE DINNER.  Marland Kitchen sertraline (ZOLOFT) 25 MG tablet TAKE 1 TABLET BY MOUTH DAILY  . simvastatin (ZOCOR) 40 MG tablet TAKE 1 TABLET BY MOUTH AT BEDTIME  . TACLONEX external suspension    No facility-administered encounter medications on file as of 08/22/2018.     Review of Systems  Constitutional: Negative for appetite change and unexpected weight change.  HENT: Negative for congestion and sinus pressure.   Respiratory: Negative for cough, chest tightness and shortness of breath.   Cardiovascular: Negative for chest pain, palpitations and leg swelling.  Gastrointestinal: Positive for nausea. Negative for abdominal pain and vomiting.  Genitourinary: Negative for difficulty urinating and dysuria.  Musculoskeletal: Negative for joint swelling and myalgias.  Skin: Negative for color change and rash.  Neurological: Negative for dizziness, light-headedness and headaches.  Psychiatric/Behavioral: Negative for agitation and dysphoric mood.       Objective:    Physical Exam  Constitutional: She appears well-developed and well-nourished. No distress.  HENT:  Nose: Nose normal.  Mouth/Throat: Oropharynx is clear and moist.  Neck: Neck supple. No thyromegaly present.  Cardiovascular: Normal rate and regular rhythm.  Pulmonary/Chest: Breath sounds normal. No respiratory distress. She has no wheezes.  Abdominal: Soft. Bowel sounds are normal. There is no tenderness.  Musculoskeletal: She exhibits no edema or tenderness.  Lymphadenopathy:      She has no cervical adenopathy.  Skin: No rash noted. No erythema.  Psychiatric: She has a normal mood and affect. Her behavior is normal.    BP 124/62 (BP Location: Left Arm, Patient Position: Sitting, Cuff Size: Normal)   Pulse 65   Temp 97.9 F (36.6 C) (Oral)   Resp 18   Wt 138 lb 12.8 oz (63 kg)   LMP 11/09/1979   SpO2 98%   BMI 27.11 kg/m  Wt Readings from Last 3 Encounters:  08/22/18 138 lb 12.8 oz (63 kg)  06/26/18 138 lb 12.8 oz (63 kg)  06/06/18 141 lb 1.9 oz (  64 kg)     Lab Results  Component Value Date   WBC 5.6 01/06/2018   HGB 12.2 01/06/2018   HCT 36.1 01/06/2018   PLT 190 01/06/2018   GLUCOSE 104 (H) 07/14/2018   CHOL 159 07/14/2018   TRIG 132.0 07/14/2018   HDL 57.00 07/14/2018   LDLDIRECT 75.0 01/06/2018   LDLCALC 75 07/14/2018   ALT 14 07/14/2018   AST 17 07/14/2018   NA 140 07/14/2018   K 4.0 07/14/2018   CL 105 07/14/2018   CREATININE 1.05 07/14/2018   BUN 22 07/14/2018   CO2 31 07/14/2018   TSH 1.35 07/14/2018   HGBA1C 6.4 (A) 07/14/2018   MICROALBUR 5.4 (H) 07/14/2018    Ct Abdomen Pelvis W Contrast  Result Date: 07/21/2018 CLINICAL DATA:  Persistent abdominal pain and diarrhea with history of lymphocytic colitis EXAM: CT ABDOMEN AND PELVIS WITH CONTRAST TECHNIQUE: Multidetector CT imaging of the abdomen and pelvis was performed using the standard protocol following bolus administration of intravenous contrast. CONTRAST:  126m ISOVUE-300 IOPAMIDOL (ISOVUE-300) INJECTION 61% COMPARISON:  12/16/2017 FINDINGS: Lower chest: No acute abnormality. Hepatobiliary: Gallbladder is decompressed. Fatty infiltration of the liver is noted. Pancreas: Unremarkable. No pancreatic ductal dilatation or surrounding inflammatory changes. Spleen: Normal in size without focal abnormality. Adrenals/Urinary Tract: The adrenal glands are within normal limits. Kidneys demonstrate no renal calculi or obstructive changes. Bladder is partially distended. Stomach/Bowel:  The appendix is within normal limits. Scattered diverticular change of the colon is noted without evidence of diverticulitis. Diffuse small-bowel diverticulosis is noted as well. No obstructive or inflammatory changes are noted. Vascular/Lymphatic: Aortic atherosclerosis. No enlarged abdominal or pelvic lymph nodes. Reproductive: Status post hysterectomy. No adnexal masses. Other: No abdominal wall hernia or abnormality. No abdominopelvic ascites. Musculoskeletal: Degenerative changes of the lumbar spine are noted. No acute bony abnormality is seen. IMPRESSION: Scattered colonic and small bowel diverticula without evidence of diverticulitis. The small bowel diverticula are more prevalent than that seen on the prior exam. Chronic changes as described above.  No acute abnormality is noted. Electronically Signed   By: MInez CatalinaM.D.   On: 07/21/2018 11:45       Assessment & Plan:   Problem List Items Addressed This Visit    Abdominal pain    Has seen GI.  On nexium.  Still with intermittent nausea, etc.  Did not tolerate carafate.  Planning to see GI specialist.        [place]    Low carb diet and exercise.  Follow met b and a1c.        Relevant Orders   Hemoglobin AM6Q  Basic metabolic panel   Essential hypertension    Blood pressure under good control.  Continue same medication regimen.  Follow pressures.  Follow metabolic panel.        Relevant Orders   CBC with Differential/Platelet   GERD (gastroesophageal reflux disease)    On nexium.  EGD 04/2018 as outlined.  Planning to f/u with GI specialist.        Hypercholesterolemia    On simvastatin.  Low cholesterol diet and exercise.  Follow lipid panel and liver function tests.        Relevant Orders   Hepatic function panel   Lipid panel   Hyperglycemia    Low carb diet and exercise.  Follow met b and a1c.        Lymphocytic colitis    Colonoscopy - biopsy - lymphocytic colitis.  On budesonide.  Has  improved.  Follow.   Continue f/u with GI.       Nausea    Persistent intermittent nausea.  Planning to f/u with GI specialist.        Osteoporosis    Follow vitamin D level.       Relevant Orders   VITAMIN D 25 Hydroxy (Vit-D Deficiency, Fractures)       Einar Pheasant, MD

## 2018-08-27 ENCOUNTER — Encounter: Payer: Self-pay | Admitting: Internal Medicine

## 2018-08-27 NOTE — Assessment & Plan Note (Signed)
Colonoscopy - biopsy - lymphocytic colitis.  On budesonide.  Has improved.  Follow.  Continue f/u with GI.

## 2018-08-27 NOTE — Assessment & Plan Note (Signed)
Follow vitamin D level.  

## 2018-08-27 NOTE — Assessment & Plan Note (Signed)
On nexium.  EGD 04/2018 as outlined.  Planning to f/u with GI specialist.

## 2018-08-27 NOTE — Assessment & Plan Note (Signed)
Has seen GI.  On nexium.  Still with intermittent nausea, etc.  Did not tolerate carafate.  Planning to see GI specialist.

## 2018-08-27 NOTE — Assessment & Plan Note (Signed)
Low carb diet and exercise.  Follow met b and a1c.   

## 2018-08-27 NOTE — Assessment & Plan Note (Signed)
On simvastatin.  Low cholesterol diet and exercise.  Follow lipid panel and liver function tests.   

## 2018-08-27 NOTE — Assessment & Plan Note (Signed)
Blood pressure under good control.  Continue same medication regimen.  Follow pressures.  Follow metabolic panel.   

## 2018-08-27 NOTE — Assessment & Plan Note (Signed)
Persistent intermittent nausea.  Planning to f/u with GI specialist.

## 2018-09-01 ENCOUNTER — Other Ambulatory Visit: Payer: Self-pay | Admitting: Internal Medicine

## 2018-10-15 ENCOUNTER — Other Ambulatory Visit: Payer: Self-pay | Admitting: Internal Medicine

## 2018-12-11 ENCOUNTER — Other Ambulatory Visit (INDEPENDENT_AMBULATORY_CARE_PROVIDER_SITE_OTHER): Payer: Medicare Other

## 2018-12-11 DIAGNOSIS — E7800 Pure hypercholesterolemia, unspecified: Secondary | ICD-10-CM

## 2018-12-11 DIAGNOSIS — I10 Essential (primary) hypertension: Secondary | ICD-10-CM | POA: Diagnosis not present

## 2018-12-11 DIAGNOSIS — E119 Type 2 diabetes mellitus without complications: Secondary | ICD-10-CM

## 2018-12-11 DIAGNOSIS — M810 Age-related osteoporosis without current pathological fracture: Secondary | ICD-10-CM | POA: Diagnosis not present

## 2018-12-11 LAB — BASIC METABOLIC PANEL
BUN: 16 mg/dL (ref 6–23)
CHLORIDE: 98 meq/L (ref 96–112)
CO2: 32 meq/L (ref 19–32)
CREATININE: 1.08 mg/dL (ref 0.40–1.20)
Calcium: 9.5 mg/dL (ref 8.4–10.5)
GFR: 51.37 mL/min — ABNORMAL LOW (ref >60.00)
Glucose, Bld: 94 mg/dL (ref 70–99)
Potassium: 4.4 mEq/L (ref 3.5–5.1)
Sodium: 136 mEq/L (ref 135–145)

## 2018-12-11 LAB — CBC WITH DIFFERENTIAL/PLATELET
Basophils Absolute: 0 10*3/uL (ref 0.0–0.1)
Basophils Relative: 0.3 % (ref 0.0–3.0)
EOS ABS: 0 10*3/uL (ref 0.0–0.7)
Eosinophils Relative: 0.4 % (ref 0.0–5.0)
HCT: 37.3 % (ref 36.0–46.0)
Hemoglobin: 12.4 g/dL (ref 12.0–15.0)
Lymphocytes Relative: 25.1 % (ref 12.0–46.0)
Lymphs Abs: 2.1 10*3/uL (ref 0.7–4.0)
MCHC: 33.3 g/dL (ref 30.0–36.0)
MCV: 89.6 fl (ref 78.0–100.0)
Monocytes Absolute: 0.8 10*3/uL (ref 0.1–1.0)
Monocytes Relative: 10.1 % (ref 3.0–12.0)
Neutro Abs: 5.3 10*3/uL (ref 1.4–7.7)
Neutrophils Relative %: 64.1 % (ref 43.0–77.0)
Platelets: 195 10*3/uL (ref 150.0–400.0)
RBC: 4.16 Mil/uL (ref 3.87–5.11)
RDW: 13.9 % (ref 11.5–15.5)
WBC: 8.3 10*3/uL (ref 4.0–10.5)

## 2018-12-11 LAB — LIPID PANEL
CHOL/HDL RATIO: 2
Cholesterol: 148 mg/dL (ref 0–200)
HDL: 64.5 mg/dL (ref >39.00)
LDL CALC: 64 mg/dL (ref 0–99)
NonHDL: 83.69
Triglycerides: 100 mg/dL (ref 0.0–149.0)
VLDL: 20 mg/dL (ref 0.0–40.0)

## 2018-12-11 LAB — VITAMIN D 25 HYDROXY (VIT D DEFICIENCY, FRACTURES): VITD: 42.18 ng/mL (ref 30.00–100.00)

## 2018-12-11 LAB — HEPATIC FUNCTION PANEL
ALK PHOS: 61 U/L (ref 39–117)
ALT: 12 U/L (ref 0–35)
AST: 16 U/L (ref 0–37)
Albumin: 3.9 g/dL (ref 3.5–5.2)
Bilirubin, Direct: 0.2 mg/dL (ref 0.0–0.3)
Total Bilirubin: 0.9 mg/dL (ref 0.2–1.2)
Total Protein: 6.9 g/dL (ref 6.0–8.3)

## 2018-12-11 LAB — HEMOGLOBIN A1C: Hgb A1c MFr Bld: 6.2 % (ref 4.6–6.5)

## 2018-12-12 ENCOUNTER — Encounter: Payer: Self-pay | Admitting: Internal Medicine

## 2018-12-12 ENCOUNTER — Ambulatory Visit: Payer: Medicare Other | Admitting: Internal Medicine

## 2018-12-12 ENCOUNTER — Ambulatory Visit (INDEPENDENT_AMBULATORY_CARE_PROVIDER_SITE_OTHER): Payer: Medicare Other

## 2018-12-12 VITALS — BP 120/72 | HR 77 | Temp 98.5°F | Resp 16 | Wt 139.2 lb

## 2018-12-12 DIAGNOSIS — E119 Type 2 diabetes mellitus without complications: Secondary | ICD-10-CM | POA: Diagnosis not present

## 2018-12-12 DIAGNOSIS — K52832 Lymphocytic colitis: Secondary | ICD-10-CM

## 2018-12-12 DIAGNOSIS — I10 Essential (primary) hypertension: Secondary | ICD-10-CM

## 2018-12-12 DIAGNOSIS — R109 Unspecified abdominal pain: Secondary | ICD-10-CM

## 2018-12-12 DIAGNOSIS — R739 Hyperglycemia, unspecified: Secondary | ICD-10-CM

## 2018-12-12 DIAGNOSIS — K219 Gastro-esophageal reflux disease without esophagitis: Secondary | ICD-10-CM

## 2018-12-12 NOTE — Progress Notes (Signed)
Patient ID: Teresa Wade, female   DOB: [birth year], 85 y.o.   MRN: [id number]   Subjective:    Patient ID: Teresa Wade, female    DOB: 1932-06-26, 85 y.o.   MRN: [id number]  HPI  Patient here for a scheduled follow up.  She has been having increased abdominal pain recently.  States she has had some intermittent flares, but recently noticed increased pain.  Since end of last week - constant abdominal pain.  A few days prior, had two episode of emesis.  Dark yellow emesis.  Reports problems with constipation.  Last good bowel movement 4-5 days ago. No vomiting.  No acid reflux.  No fever.  Decreased appetite.  No chest pain or sob.     Past Medical History:  Diagnosis Date  . Abnormal chest CT    right hilar cyst  . Anxiety   . Fibrocystic disease of breast   . GERD (gastroesophageal reflux disease)   . History of thrombocytopenia   . Hypercholesterolemia   . Hyperglycemia   . Hypertension   . Osteoarthritis   . Osteoporosis    s/p Actonel, Reclast (last 2011)  . Palpitations   . Pre-diabetes   . Psoriasis   . Reflux esophagitis    Past Surgical History:  Procedure Laterality Date  . ABDOMINAL HYSTERECTOMY  1980  . BREAST EXCISIONAL BIOPSY Left 1976   benign  . COLONOSCOPY WITH PROPOFOL N/A 05/05/2018   Procedure: COLONOSCOPY WITH PROPOFOL;  Surgeon: Manya Silvas, MD;  Location: [facility] ENDOSCOPY;  Service: Endoscopy;  Laterality: N/A;  . ESOPHAGOGASTRODUODENOSCOPY (EGD) WITH PROPOFOL N/A 05/05/2018   Procedure: ESOPHAGOGASTRODUODENOSCOPY (EGD) WITH PROPOFOL;  Surgeon: Manya Silvas, MD;  Location: [facility] ENDOSCOPY;  Service: Endoscopy;  Laterality: N/A;  . THUMB AMPUTATION Left 2015   Family History  Problem Relation Age of Onset  . Heart disease Mother   . Hypertension Mother   . Mental illness Other        sibling, suicide  . Colon cancer Sister   . Sleep apnea Brother    Social History   Socioeconomic History  . Marital status: Widowed    Spouse name: Not  on file  . Number of children: 1  . Years of education: Not on file  . Highest education level: Not on file  Occupational History  . Not on file  Social Needs  . Financial resource strain: Not hard at all  . Food insecurity:    Worry: Never true    Inability: Never true  . Transportation needs:    Medical: No    Non-medical: No  Tobacco Use  . Smoking status: Former Smoker    Last attempt to quit: 1999    Years since quitting: 21.0  . Smokeless tobacco: Never Used  Substance and Sexual Activity  . Alcohol use: No    Alcohol/week: 0.0 standard drinks  . Drug use: No  . Sexual activity: Never  Lifestyle  . Physical activity:    Days per week: Not on file    Minutes per session: Not on file  . Stress: Not on file  Relationships  . Social connections:    Talks on phone: Not on file    Gets together: Not on file    Attends religious service: Not on file    Active member of club or organization: Not on file    Attends meetings of clubs or organizations: Not on file    Relationship status: Not on file  Other  Topics Concern  . Not on file  Social History Narrative  . Not on file    Outpatient Encounter Medications as of 12/12/2018  Medication Sig  . aspirin 81 MG tablet Take 81 mg by mouth daily.  . budesonide (ENTOCORT EC) 3 MG 24 hr capsule Take 3 mg by mouth every morning.  . calcium carbonate (OS-CAL) 1250 MG chewable tablet Chew 1 tablet by mouth 2 (two) times daily.  . calcium elemental as carbonate (TUMS ULTRA 1000) 400 MG chewable tablet Chew by mouth.  . clobetasol ointment (TEMOVATE) 0.73 % Apply 1 application topically 2 (two) times daily.  Marland Kitchen esomeprazole (NEXIUM) 40 MG capsule Take 40 mg by mouth daily before breakfast.  . ipratropium (ATROVENT) 0.03 % nasal spray INSTILL 1 TO 2 SPRAYS INTO EACH NOSTRIL UP TO 3 TIMES A DAY FOR DRAINAGE  . losartan (COZAAR) 100 MG tablet TAKE 1 TABLET BY MOUTH EVERY DAY  . magnesium oxide (MAG-OX) 400 MG tablet Take 400 mg by  mouth daily.  . nitroGLYCERIN (NITROSTAT) 0.4 MG SL tablet Place 1 tablet (0.4 mg total) under the tongue every 5 (five) minutes as needed for chest pain. May repeat x 1.  If persistent pain, call 911  . ranitidine (ZANTAC) 300 MG tablet TAKE 1 TABLET (300 MG TOTAL) BY MOUTH DAILY BEFORE DINNER TAKE 15-30 MINUTES BEFORE DINNER.  Marland Kitchen simvastatin (ZOCOR) 40 MG tablet TAKE 1 TABLET BY MOUTH AT BEDTIME  . TACLONEX external suspension   . [DISCONTINUED] acebutolol (SECTRAL) 200 MG capsule TAKE 2 CAPSULES BY MOUTH EVERY MORNING & 1 CAPSULE EVERY EVENING  . [DISCONTINUED] sertraline (ZOLOFT) 25 MG tablet TAKE 1 TABLET BY MOUTH DAILY  . [DISCONTINUED] budesonide (ENTOCORT EC) 3 MG 24 hr capsule Take 9 mg by mouth every morning.  . [DISCONTINUED] BUDESONIDE PO Take 3 capsules by mouth every morning.  . [DISCONTINUED] esomeprazole (NEXIUM) 40 MG capsule TAKE ONE CAPSULE BY MOUTH TWICE A DAY (INS WILL ONLY COVER 1 PER DAY- GETTING PA)   No facility-administered encounter medications on file as of 12/12/2018.     Review of Systems  Constitutional: Negative for unexpected weight change.       Decreased appetite.    HENT: Negative for congestion and sinus pressure.   Respiratory: Negative for cough, chest tightness and shortness of breath.   Cardiovascular: Negative for chest pain, palpitations and leg swelling.  Gastrointestinal: Positive for abdominal pain and constipation. Negative for diarrhea and vomiting.  Genitourinary: Negative for difficulty urinating and dysuria.  Musculoskeletal: Negative for joint swelling and myalgias.  Skin: Negative for color change and rash.  Neurological: Negative for dizziness, light-headedness and headaches.  Psychiatric/Behavioral: Negative for agitation and dysphoric mood.       Objective:     Blood pressure rechecked by me:  128/68  Physical Exam Constitutional:      General: She is not in acute distress.    Appearance: Normal appearance.  HENT:     Nose:  Nose normal. No congestion.     Mouth/Throat:     Pharynx: No oropharyngeal exudate or posterior oropharyngeal erythema.  Neck:     Musculoskeletal: Neck supple.     Thyroid: No thyromegaly.  Cardiovascular:     Rate and Rhythm: Normal rate and regular rhythm.  Pulmonary:     Effort: No respiratory distress.     Breath sounds: Normal breath sounds. No wheezing.  Abdominal:     General: Bowel sounds are normal.     Palpations: Abdomen is soft.  Comments: Minimal tenderness to palpation.    Musculoskeletal:        General: No swelling or tenderness.  Lymphadenopathy:     Cervical: No cervical adenopathy.  Skin:    Findings: No erythema or rash.  Neurological:     Mental Status: She is alert.  Psychiatric:        Mood and Affect: Mood normal.        Behavior: Behavior normal.     BP 120/72 (BP Location: Left Arm, Patient Position: Sitting, Cuff Size: Normal)   Pulse 77   Temp 98.5 F (36.9 C) (Oral)   Resp 16   Wt 139 lb 3.2 oz (63.1 kg)   LMP 11/09/1979   SpO2 97%   BMI 27.19 kg/m  Wt Readings from Last 3 Encounters:  12/12/18 139 lb 3.2 oz (63.1 kg)  08/22/18 138 lb 12.8 oz (63 kg)  06/26/18 138 lb 12.8 oz (63 kg)     Lab Results  Component Value Date   WBC 8.3 12/11/2018   HGB 12.4 12/11/2018   HCT 37.3 12/11/2018   PLT 195.0 12/11/2018   GLUCOSE 94 12/11/2018   CHOL 148 12/11/2018   TRIG 100.0 12/11/2018   HDL 64.50 12/11/2018   LDLDIRECT 75.0 01/06/2018   LDLCALC 64 12/11/2018   ALT 12 12/11/2018   AST 16 12/11/2018   NA 136 12/11/2018   K 4.4 12/11/2018   CL 98 12/11/2018   CREATININE 1.08 12/11/2018   BUN 16 12/11/2018   CO2 32 12/11/2018   TSH 1.35 07/14/2018   HGBA1C 6.2 12/11/2018   MICROALBUR 5.4 (H) 07/14/2018    Ct Abdomen Pelvis W Contrast  Result Date: 07/21/2018 CLINICAL DATA:  Persistent abdominal pain and diarrhea with history of lymphocytic colitis EXAM: CT ABDOMEN AND PELVIS WITH CONTRAST TECHNIQUE: Multidetector CT imaging  of the abdomen and pelvis was performed using the standard protocol following bolus administration of intravenous contrast. CONTRAST:  152m ISOVUE-300 IOPAMIDOL (ISOVUE-300) INJECTION 61% COMPARISON:  12/16/2017 FINDINGS: Lower chest: No acute abnormality. Hepatobiliary: Gallbladder is decompressed. Fatty infiltration of the liver is noted. Pancreas: Unremarkable. No pancreatic ductal dilatation or surrounding inflammatory changes. Spleen: Normal in size without focal abnormality. Adrenals/Urinary Tract: The adrenal glands are within normal limits. Kidneys demonstrate no renal calculi or obstructive changes. Bladder is partially distended. Stomach/Bowel: The appendix is within normal limits. Scattered diverticular change of the colon is noted without evidence of diverticulitis. Diffuse small-bowel diverticulosis is noted as well. No obstructive or inflammatory changes are noted. Vascular/Lymphatic: Aortic atherosclerosis. No enlarged abdominal or pelvic lymph nodes. Reproductive: Status post hysterectomy. No adnexal masses. Other: No abdominal wall hernia or abnormality. No abdominopelvic ascites. Musculoskeletal: Degenerative changes of the lumbar spine are noted. No acute bony abnormality is seen. IMPRESSION: Scattered colonic and small bowel diverticula without evidence of diverticulitis. The small bowel diverticula are more prevalent than that seen on the prior exam. Chronic changes as described above.  No acute abnormality is noted. Electronically Signed   By: MInez CatalinaM.D.   On: 07/21/2018 11:45       Assessment & Plan:   Problem List Items Addressed This Visit    Abdominal pain - Primary    Has seen GI.  Has been on budesinide previously.  Stopped recently.  She felt was contributing to her constipation.  Discussed taking miralax and stool softener.  Has been taking on an intermittent basis.  Discussed using enema today.  Check KUB.  Discussed with GI.  Will call with  update tomorrow after enema.   F/u with GI if persistent problems.        Relevant Orders   DG Abd 1 View (Completed)   Diabetes ([place])    Low carb diet and exercise.  Follow met b and a1c.        Essential hypertension    Blood pressure under good control.  Continue same medication regimen.  Follow pressures.  Follow metabolic panel.        GERD (gastroesophageal reflux disease)    On nexium.  No acid reflux.        Relevant Medications   esomeprazole (NEXIUM) 40 MG capsule   Hyperglycemia    Low carb diet and exercise.  Follow met b and a1c.       Lymphocytic colitis    Known history of lymphocytic colitis.  Using budesonide previously.  Stopped recently.  Plans f/u with GI as outlined.            Einar Pheasant, MD

## 2018-12-13 ENCOUNTER — Telehealth: Payer: Self-pay | Admitting: *Deleted

## 2018-12-13 NOTE — Telephone Encounter (Signed)
Copied from [place] [id number]. Topic: Quick Communication - Other Results (Clinic Use ONLY) >> Dec 13, 2018  8:22 AM Lennox Solders wrote: Pt would like xray results from yesterday. Pt will not be at home from 10 am until 1 pm today.

## 2018-12-13 NOTE — Telephone Encounter (Signed)
Already addressed. Closing note

## 2018-12-13 NOTE — Telephone Encounter (Signed)
Already addressed. Closing note.

## 2018-12-13 NOTE — Telephone Encounter (Signed)
Copied from [place] [id number]. Topic: General - Other >> Dec 13, 2018  8:19 AM Lennox Solders wrote: Reason for CRM: pt is calling back to let dr scott know her stomach is somewhat better but she is still having pain. Pt saw dr Nicki Reaper yesterday. Pt is just calling to update dr Nicki Reaper. Pt will be unavailable from 10 am until 1 pm today

## 2018-12-15 ENCOUNTER — Other Ambulatory Visit: Payer: Self-pay | Admitting: Internal Medicine

## 2018-12-16 ENCOUNTER — Encounter: Payer: Self-pay | Admitting: Internal Medicine

## 2018-12-16 NOTE — Assessment & Plan Note (Signed)
Low carb diet and exercise.  Follow met b and a1c.   

## 2018-12-16 NOTE — Assessment & Plan Note (Signed)
Blood pressure under good control.  Continue same medication regimen.  Follow pressures.  Follow metabolic panel.   

## 2018-12-16 NOTE — Assessment & Plan Note (Signed)
On nexium. No acid reflux.   

## 2018-12-16 NOTE — Assessment & Plan Note (Signed)
Known history of lymphocytic colitis.  Using budesonide previously.  Stopped recently.  Plans f/u with GI as outlined.

## 2018-12-16 NOTE — Assessment & Plan Note (Signed)
Has seen GI.  Has been on budesinide previously.  Stopped recently.  She felt was contributing to her constipation.  Discussed taking miralax and stool softener.  Has been taking on an intermittent basis.  Discussed using enema today.  Check KUB.  Discussed with GI.  Will call with update tomorrow after enema.  F/u with GI if persistent problems.

## 2018-12-16 NOTE — Assessment & Plan Note (Signed)
Low carb diet and exercise.  Follow met b and a1c.  

## 2018-12-30 ENCOUNTER — Other Ambulatory Visit: Payer: Self-pay | Admitting: Internal Medicine

## 2019-03-16 ENCOUNTER — Telehealth: Payer: Self-pay | Admitting: *Deleted

## 2019-03-16 DIAGNOSIS — I10 Essential (primary) hypertension: Secondary | ICD-10-CM

## 2019-03-16 DIAGNOSIS — E7800 Pure hypercholesterolemia, unspecified: Secondary | ICD-10-CM

## 2019-03-16 DIAGNOSIS — R739 Hyperglycemia, unspecified: Secondary | ICD-10-CM

## 2019-03-16 NOTE — Telephone Encounter (Signed)
Please place future order for lab appt on Monday. 

## 2019-03-18 NOTE — Telephone Encounter (Signed)
Orders placed for f/u labs.  

## 2019-03-19 ENCOUNTER — Other Ambulatory Visit: Payer: Self-pay

## 2019-03-19 ENCOUNTER — Other Ambulatory Visit (INDEPENDENT_AMBULATORY_CARE_PROVIDER_SITE_OTHER): Payer: Medicare Other

## 2019-03-19 DIAGNOSIS — E7800 Pure hypercholesterolemia, unspecified: Secondary | ICD-10-CM | POA: Diagnosis not present

## 2019-03-19 DIAGNOSIS — I10 Essential (primary) hypertension: Secondary | ICD-10-CM

## 2019-03-19 DIAGNOSIS — R739 Hyperglycemia, unspecified: Secondary | ICD-10-CM | POA: Diagnosis not present

## 2019-03-19 LAB — LIPID PANEL
Cholesterol: 130 mg/dL (ref 0–200)
HDL: 48.5 mg/dL (ref >39.00)
LDL Cholesterol: 61 mg/dL (ref 0–99)
NonHDL: 81.86
Total CHOL/HDL Ratio: 3
Triglycerides: 104 mg/dL (ref 0.0–149.0)
VLDL: 20.8 mg/dL (ref 0.0–40.0)

## 2019-03-19 LAB — HEPATIC FUNCTION PANEL
ALT: 14 U/L (ref 0–35)
AST: 18 U/L (ref 0–37)
Albumin: 3.9 g/dL (ref 3.5–5.2)
Alkaline Phosphatase: 62 U/L (ref 39–117)
Bilirubin, Direct: 0.1 mg/dL (ref 0.0–0.3)
Total Bilirubin: 0.6 mg/dL (ref 0.2–1.2)
Total Protein: 6.2 g/dL (ref 6.0–8.3)

## 2019-03-19 LAB — BASIC METABOLIC PANEL
BUN: 17 mg/dL (ref 6–23)
CO2: 31 mEq/L (ref 19–32)
Calcium: 9.4 mg/dL (ref 8.4–10.5)
Chloride: 104 mEq/L (ref 96–112)
Creatinine, Ser: 0.85 mg/dL (ref 0.40–1.20)
GFR: 63.53 mL/min (ref >60.00)
Glucose, Bld: 105 mg/dL — ABNORMAL HIGH (ref 70–99)
Potassium: 4.4 mEq/L (ref 3.5–5.1)
Sodium: 141 mEq/L (ref 135–145)

## 2019-03-19 LAB — HEMOGLOBIN A1C: Hgb A1c MFr Bld: 6.3 % (ref 4.6–6.5)

## 2019-03-23 ENCOUNTER — Other Ambulatory Visit: Payer: Self-pay | Admitting: Internal Medicine

## 2019-03-23 ENCOUNTER — Ambulatory Visit (INDEPENDENT_AMBULATORY_CARE_PROVIDER_SITE_OTHER): Payer: Medicare Other | Admitting: Internal Medicine

## 2019-03-23 ENCOUNTER — Other Ambulatory Visit: Payer: Self-pay

## 2019-03-23 ENCOUNTER — Encounter: Payer: Self-pay | Admitting: Internal Medicine

## 2019-03-23 DIAGNOSIS — K52832 Lymphocytic colitis: Secondary | ICD-10-CM

## 2019-03-23 DIAGNOSIS — I10 Essential (primary) hypertension: Secondary | ICD-10-CM

## 2019-03-23 DIAGNOSIS — K219 Gastro-esophageal reflux disease without esophagitis: Secondary | ICD-10-CM

## 2019-03-23 DIAGNOSIS — E119 Type 2 diabetes mellitus without complications: Secondary | ICD-10-CM | POA: Diagnosis not present

## 2019-03-23 DIAGNOSIS — E7800 Pure hypercholesterolemia, unspecified: Secondary | ICD-10-CM

## 2019-03-23 DIAGNOSIS — R109 Unspecified abdominal pain: Secondary | ICD-10-CM

## 2019-03-23 NOTE — Progress Notes (Addendum)
Patient ID: Teresa Wade, female   DOB: Feb 11, 1932, 85 y.o.   MRN: [id number] Virtual Visit via Telephone Note  This visit type was conducted due to national recommendations for restrictions regarding the COVID-19 pandemic (e.g. social distancing).  This format is felt to be most appropriate for this patient at this time.  All issues noted in this document were discussed and addressed.  No physical exam was performed (except for noted visual exam findings with Video Visits).   I connected with Lamont Weissberg on 03/23/19 at  1:30 PM EDT by telephone and verified that I am speaking with the correct person using two identifiers. Location patient: home Location provider: work or home office Persons participating in the virtual visit: patient, provider  I discussed the limitations, risks, security and privacy concerns of performing an evaluation and management service by telephone and the availability of in person appointments. I also discussed with the patient that there may be a patient responsible charge related to this service. The patient expressed understanding and agreed to proceed.   Reason for visit: scheduled follow up.    HPI: She has had extensive GI evaluation for postprandial abdominal pain and nausea/vomiting.  Hydrogen breath test was positive initially treated with augmentin and was changed to keflex and flagyl.  She was also treated with rifaximin and budesonide - one per day.  Was having problems with constipation. Started miralax. Bowels improved.  Takes hyoscyamine prn intermittent abdominal cramping.  Still having intermittent nausea and vomiting.  zofran prn.  Also on nexium q day.  She reports still having intermittent flares of above.  States 14 days - maximum time can go without a flare.  Hyoscyamine helps.  Trying to stay active.  No chest pain.  No sob.  Did have episode of elevated blood pressure recently.  Recheck today 142/70.  Discussed the need for spot checking her  pressure.  If remains elevated will need to adjust medication.  Trying to stay in.  No known COVID exposure.  No fever.  No chest congestion , cough or sob.     ROS: See pertinent positives and negatives per HPI.  Past Medical History:  Diagnosis Date  . Abnormal chest CT    right hilar cyst  . Anxiety   . Fibrocystic disease of breast   . GERD (gastroesophageal reflux disease)   . History of thrombocytopenia   . Hypercholesterolemia   . Hyperglycemia   . Hypertension   . Osteoarthritis   . Osteoporosis    s/p Actonel, Reclast (last 2011)  . Palpitations   . Pre-diabetes   . Psoriasis   . Reflux esophagitis     Past Surgical History:  Procedure Laterality Date  . ABDOMINAL HYSTERECTOMY  1980  . BREAST EXCISIONAL BIOPSY Left 1976   benign  . COLONOSCOPY WITH PROPOFOL N/A 05/05/2018   Procedure: COLONOSCOPY WITH PROPOFOL;  Surgeon: Manya Silvas, MD;  Location[facility] ENDOSCOPY;  Service: Endoscopy;  Laterality: N/A;  . ESOPHAGOGASTRODUODENOSCOPY (EGD) WITH PROPOFOL N/A 05/05/2018   Procedure: ESOPHAGOGASTRODUODENOSCOPY (EGD) WITH PROPOFOL;  Surgeon: Manya Silvas, MD;  Location: [facility] ENDOSCOPY;  Service: Endoscopy;  Laterality: N/A;  . THUMB AMPUTATION Left 2015    Family History  Problem Relation Age of Onset  . Heart disease Mother   . Hypertension Mother   . Mental illness Other        sibling, suicide  . Colon cancer Sister   . Sleep apnea Brother     SOCIAL HX: reviewed.  Current Outpatient Medications:  .  acebutolol (SECTRAL) 200 MG capsule, TAKE 2 CAPSULES BY MOUTH EVERY MORNING & 1 CAPSULE EVERY EVENING, Disp: 270 capsule, Rfl: 2 .  aspirin 81 MG tablet, Take 81 mg by mouth daily., Disp: , Rfl:  .  budesonide (ENTOCORT EC) 3 MG 24 hr capsule, TAKE 3 CAPSULES BY MOUTH IN THE MORNING, Disp: , Rfl:  .  calcium carbonate (OS-CAL) 1250 MG chewable tablet, Chew 1 tablet by mouth 2 (two) times daily., Disp: , Rfl:  .  calcium elemental as carbonate (TUMS  ULTRA 1000) 400 MG chewable tablet, Chew by mouth., Disp: , Rfl:  .  clobetasol ointment (TEMOVATE) 0.35 %, Apply 1 application topically 2 (two) times daily., Disp: , Rfl:  .  esomeprazole (NEXIUM) 40 MG capsule, Take 40 mg by mouth daily before breakfast., Disp: , Rfl:  .  ipratropium (ATROVENT) 0.03 % nasal spray, INSTILL 1 TO 2 SPRAYS INTO EACH NOSTRIL UP TO 3 TIMES A DAY FOR DRAINAGE, Disp: , Rfl: 12 .  losartan (COZAAR) 100 MG tablet, TAKE 1 TABLET BY MOUTH EVERY DAY, Disp: 90 tablet, Rfl: 1 .  magnesium oxide (MAG-OX) 400 MG tablet, Take 400 mg by mouth daily., Disp: , Rfl:  .  nitroGLYCERIN (NITROSTAT) 0.4 MG SL tablet, Place 1 tablet (0.4 mg total) under the tongue every 5 (five) minutes as needed for chest pain. May repeat x 1.  If persistent pain, call 911, Disp: 25 tablet, Rfl: 0 .  sertraline (ZOLOFT) 25 MG tablet, TAKE 1 TABLET BY MOUTH DAILY, Disp: 90 tablet, Rfl: 1 .  simvastatin (ZOCOR) 40 MG tablet, TAKE 1 TABLET BY MOUTH EVERYDAY AT BEDTIME, Disp: 90 tablet, Rfl: 0 .  TACLONEX external suspension, , Disp: , Rfl: 2  EXAM:  GENERAL: alert.  Sounds in no acute distress.  Answering questions appropriately.    PSYCH/NEURO: pleasant and cooperative, no obvious depression or anxiety, speech and thought processing grossly intact  ASSESSMENT AND PLAN:  Discussed the following assessment and plan:  Abdominal pain, unspecified abdominal location  Type 2 diabetes mellitus without complication, without long-term current use of insulin (HCC)  Essential hypertension  Gastroesophageal reflux disease, esophagitis presence not specified  Hypercholesterolemia  Lymphocytic colitis  Abdominal pain Has seen GI.  Extensive w/up.  Treated for SIBO.  Some improvement while on abx.  Has zofran and hyoscyamine to take as needed.  Taking budesonide daily.  Followed by GI.  Overall feels stable.    Diabetes Low carb diet and exercise.  Follow met b and a1c.    Essential  hypertension Blood pressure as outlined.  Recheck today 142/70.  Have her spot check her pressure.  If remains elevated, will need to adjust medication.    GERD (gastroesophageal reflux disease) On nexium. No acid reflux.    Hypercholesterolemia On simvastatin.  Low cholesterol diet and exercise.  Follow lipid panel and liver function tests.    Lymphocytic colitis Followed by GI.  entocort daily.  Feels is stable.  Intermittent GI flares.     I discussed the assessment and treatment plan with the patient. The patient was provided an opportunity to ask questions and all were answered. The patient agreed with the plan and demonstrated an understanding of the instructions.   The patient was advised to call back or seek an in-person evaluation if the symptoms worsen or if the condition fails to improve as anticipated.  I provided  20 minutes of non-face-to-face time during this encounter.   Einar Pheasant,  MD

## 2019-03-25 NOTE — Assessment & Plan Note (Signed)
Blood pressure as outlined.  Recheck today 142/70.  Have her spot check her pressure.  If remains elevated, will need to adjust medication.

## 2019-03-25 NOTE — Assessment & Plan Note (Signed)
Has seen GI.  Extensive w/up.  Treated for SIBO.  Some improvement while on abx.  Has zofran and hyoscyamine to take as needed.  Taking budesonide daily.  Followed by GI.  Overall feels stable.

## 2019-03-25 NOTE — Assessment & Plan Note (Signed)
Low carb diet and exercise.  Follow met b and a1c.   

## 2019-03-25 NOTE — Assessment & Plan Note (Signed)
Followed by GI.  entocort daily.  Feels is stable.  Intermittent GI flares.

## 2019-03-25 NOTE — Assessment & Plan Note (Signed)
On nexium. No acid reflux.

## 2019-03-25 NOTE — Assessment & Plan Note (Signed)
On simvastatin.  Low cholesterol diet and exercise.  Follow lipid panel and liver function tests.   

## 2019-05-07 ENCOUNTER — Telehealth: Payer: Self-pay | Admitting: Internal Medicine

## 2019-05-07 NOTE — Telephone Encounter (Signed)
Noted. Already advised pt to do so

## 2019-05-07 NOTE — Telephone Encounter (Signed)
Reviewed. Blood pressures ok.  Continue to spot check.

## 2019-05-07 NOTE — Telephone Encounter (Signed)
Pt did not need anything. She was just calling in to give avg bp reading. Bp is staying about 137/73. Confirmed pt doing okay, feeling good. No acute symptoms at this time. Advised that I would call her back if needed and thanked her for the update

## 2019-05-07 NOTE — Telephone Encounter (Signed)
Copied from [place] [id number]. Topic: Quick Communication - See Telephone Encounter >> May 07, 2019  9:28 AM Robina Ade, Helene Kelp D wrote: CRM for notification. See Telephone encounter for: 05/07/19. Patient called and would like to let Dr. Nicki Reaper know that her average BP reading from 4/24-05/01/19 has been BP 137/73. She would like to talk to Dr. Nicki Reaper about this. Please call patient back, thanks.

## 2019-05-08 ENCOUNTER — Other Ambulatory Visit: Payer: Self-pay | Admitting: Internal Medicine

## 2019-06-08 ENCOUNTER — Other Ambulatory Visit: Payer: Self-pay

## 2019-06-08 ENCOUNTER — Ambulatory Visit (INDEPENDENT_AMBULATORY_CARE_PROVIDER_SITE_OTHER): Payer: Medicare Other

## 2019-06-08 DIAGNOSIS — Z0000 Encounter for general adult medical examination without abnormal findings: Secondary | ICD-10-CM | POA: Diagnosis not present

## 2019-06-08 NOTE — Patient Instructions (Addendum)
  Ms. Teresa Wade , Thank you for taking time to come for your Medicare Wellness Visit. I appreciate your ongoing commitment to your health goals. Please review the following plan we discussed and let me know if I can assist you in the future.   These are the goals we discussed: Goals      Patient Stated   . Follow up with Primary Care Provider (pt-stated)     Keep all routine maintenance appointments.        This is a list of the screening recommended for you and due dates:  Health Maintenance  Topic Date Due  . Complete foot exam   09/02/2018  . DEXA scan (bone density measurement)  06/27/2020*  . Flu Shot  06/30/2019  . Mammogram  07/11/2019  . Hemoglobin A1C  09/18/2019  . Eye exam for diabetics  01/23/2020  . Tetanus Vaccine  04/12/2023  . Pneumonia vaccines  Completed  *Topic was postponed. The date shown is not the original due date.

## 2019-06-08 NOTE — Progress Notes (Signed)
Subjective:   Teresa Wade is a 85 y.o. female who presents for Medicare Annual (Subsequent) preventive examination.  Review of Systems:  No ROS.  Medicare Wellness Virtual Visit.  Visual/audio telehealth visit, UTA vital signs.   See social history for additional risk factors.   Cardiac Risk Factors include: advanced age (>50men, >29 women);hypertension;diabetes mellitus     Objective:     Vitals: LMP 11/09/1979   There is no height or weight on file to calculate BMI.  Advanced Directives 06/08/2019 06/06/2018 05/05/2018 04/18/2017 12/31/2016 12/23/2016 12/07/2016  Does Patient Have a Medical Advance Directive? Yes Yes Yes Yes Yes No No  Type of Paramedic of [place];Living will Healthcare Power of [place] - -  Does patient want to make changes to medical advance directive? No - Patient declined No - Patient declined - No - Patient declined - - -  Copy of [place] in Chart? No - copy requested No - copy requested No - copy requested No - copy requested - - -    Tobacco Social History   Tobacco Use  Smoking Status Former Smoker  . Quit date: 1999  . Years since quitting: 21.5  Smokeless Tobacco Never Used     Counseling given: Not Answered   Clinical Intake:  Pre-visit preparation completed: Yes        Diabetes: Yes(Followed by pcp)  How often do you need to have someone help you when you read instructions, pamphlets, or other written materials from your doctor or pharmacy?: 1 - Never  Interpreter Needed?: No     Past Medical History:  Diagnosis Date  . Abnormal chest CT    right hilar cyst  . Anxiety   . Fibrocystic disease of breast   . GERD (gastroesophageal reflux disease)   . History of thrombocytopenia   . Hypercholesterolemia   . Hyperglycemia   . Hypertension   . Osteoarthritis   . Osteoporosis    s/p Actonel,  Reclast (last 2011)  . Palpitations   . Pre-diabetes   . Psoriasis   . Reflux esophagitis    Past Surgical History:  Procedure Laterality Date  . ABDOMINAL HYSTERECTOMY  1980  . BREAST EXCISIONAL BIOPSY Left 1976   benign  . COLONOSCOPY WITH PROPOFOL N/A 05/05/2018   Procedure: COLONOSCOPY WITH PROPOFOL;  Surgeon: Manya Silvas, MD;  Location: [facility] ENDOSCOPY;  Service: Endoscopy;  Laterality: N/A;  . ESOPHAGOGASTRODUODENOSCOPY (EGD) WITH PROPOFOL N/A 05/05/2018   Procedure: ESOPHAGOGASTRODUODENOSCOPY (EGD) WITH PROPOFOL;  Surgeon: Manya Silvas, MD;  Location: [facility] ENDOSCOPY;  Service: Endoscopy;  Laterality: N/A;  . THUMB AMPUTATION Left 2015   Family History  Problem Relation Age of Onset  . Heart disease Mother   . Hypertension Mother   . Mental illness Other        sibling, suicide  . Colon cancer Sister   . Sleep apnea Brother    Social History   Socioeconomic History  . Marital status: Widowed    Spouse name: Not on file  . Number of children: 1  . Years of education: Not on file  . Highest education level: Not on file  Occupational History  . Not on file  Social Needs  . Financial resource strain: Not hard at all  . Food insecurity    Worry: Never true    Inability: Never true  . Transportation needs    Medical: No  Non-medical: No  Tobacco Use  . Smoking status: Former Smoker    Quit date: 1999    Years since quitting: 21.5  . Smokeless tobacco: Never Used  Substance and Sexual Activity  . Alcohol use: No    Alcohol/week: 0.0 standard drinks  . Drug use: No  . Sexual activity: Never  Lifestyle  . Physical activity    Days per week: 2 days    Minutes per session: 30 min  . Stress: Not at all  Relationships  . Social Herbalist on phone: Not on file    Gets together: Not on file    Attends religious service: Not on file    Active member of club or organization: Not on file    Attends meetings of clubs or organizations: Not on file     Relationship status: Not on file  Other Topics Concern  . Not on file  Social History Narrative  . Not on file    Outpatient Encounter Medications as of 06/08/2019  Medication Sig  . acebutolol (SECTRAL) 200 MG capsule TAKE 2 CAPSULES BY MOUTH EVERY MORNING & 1 CAPSULE EVERY EVENING  . aspirin 81 MG tablet Take 81 mg by mouth daily.  . budesonide (ENTOCORT EC) 3 MG 24 hr capsule TAKE 3 CAPSULES BY MOUTH IN THE MORNING  . calcium carbonate (OS-CAL) 1250 MG chewable tablet Chew 1 tablet by mouth 2 (two) times daily.  . calcium elemental as carbonate (TUMS ULTRA 1000) 400 MG chewable tablet Chew by mouth.  . clobetasol ointment (TEMOVATE) 2.67 % Apply 1 application topically 2 (two) times daily.  Marland Kitchen esomeprazole (NEXIUM) 40 MG capsule TAKE ONE CAPSULE BY MOUTH TWICE A DAY (INS WILL ONLY COVER 1 PER DAY- GETTING PA)  . ipratropium (ATROVENT) 0.03 % nasal spray INSTILL 1 TO 2 SPRAYS INTO EACH NOSTRIL UP TO 3 TIMES A DAY FOR DRAINAGE  . losartan (COZAAR) 100 MG tablet TAKE 1 TABLET BY MOUTH EVERY DAY  . magnesium oxide (MAG-OX) 400 MG tablet Take 400 mg by mouth daily.  . nitroGLYCERIN (NITROSTAT) 0.4 MG SL tablet Place 1 tablet (0.4 mg total) under the tongue every 5 (five) minutes as needed for chest pain. May repeat x 1.  If persistent pain, call 911  . simvastatin (ZOCOR) 40 MG tablet TAKE 1 TABLET BY MOUTH EVERYDAY AT BEDTIME  . TACLONEX external suspension   . [DISCONTINUED] sertraline (ZOLOFT) 25 MG tablet TAKE 1 TABLET BY MOUTH DAILY   No facility-administered encounter medications on file as of 06/08/2019.     Activities of Daily Living In your present state of health, do you have any difficulty performing the following activities: 06/08/2019  Hearing? Y  Comment Hearing aids  Vision? N  Difficulty concentrating or making decisions? N  Walking or climbing stairs? N  Dressing or bathing? N  Doing errands, shopping? N  Preparing Food and eating ? N  Using the Toilet? N  In the  past six months, have you accidently leaked urine? N  Do you have problems with loss of bowel control? N  Managing your Medications? N  Managing your Finances? N  Housekeeping or managing your Housekeeping? N  Some recent data might be hidden    Patient Care Team: Einar Pheasant, MD as PCP - General (Internal Medicine)    Assessment:   This is a routine wellness examination for Teresa Wade.  I connected with patient 06/08/19 at  9:00 AM EDT by an audio enabled telemedicine application and  verified that I am speaking with the correct person using two identifiers. Patient stated full name and DOB. Patient gave permission to continue with virtual visit. Patient's location was at home and Nurse's location was at [place] office.   Health Screenings  Mammogram - 06/2018 Colonoscopy - 04/2018 Bone Density - 03/2011. She is interested in having another and will discuss with her doctor at upcoming cpe.  Glaucoma -none Hearing -demonstrates normal hearing during visit. Hemoglobin A1C - 02/2019 Cholesterol - 02/2019 Dental- UTD Vision- exam scheduled.  Social  Alcohol intake - no         Smoking history- former   Smokers in home? none Illicit drug use? none Physical activity - yard work, Immunologist Diet - healthy Sexually Active -not currently BMI- discussed the importance of a healthy diet, water intake and the benefits of aerobic exercise.  Educational material provided.   Safety  Patient feels safe at home- yes Patient does have smoke detectors at home- yes Patient does wear sunscreen or protective clothing when in direct sunlight -yes Patient does wear seat belt when in a moving vehicle -yes Patient drives- yes  [id number] precautions and sickness symptoms discussed.   Activities of Daily Living Patient denies needing assistance with: driving, household chores, feeding themselves, getting from bed to chair, getting to the toilet, bathing/showering, dressing, managing money, or preparing  meals.  No new identified risk were noted.    Depression Screen Patient denies losing interest in daily life, feeling hopeless, or crying easily over simple problems.   Medication-taking as directed and without issues.   Fall Screen Patient denies being afraid of falling or falling in the last year.   Memory Screen Patient is alert.  Patient denies difficulty focusing, concentrating or misplacing items. Correctly identified the president of the [place], season and recall. Patient likes to read, word search and jigsaw puzzles for brain stimulation.  Immunizations The following Immunizations were discussed: Influenza, shingles, pneumonia, and tetanus.   Other Providers Patient Care Team: Einar Pheasant, MD as PCP - General (Internal Medicine)  Exercise Activities and Dietary recommendations Current Exercise Habits: Home exercise routine, Intensity: Mild  Goals      Patient Stated   . Follow up with Primary Care Provider (pt-stated)     Keep all routine maintenance appointments.        Fall Risk Fall Risk  06/08/2019 06/06/2018 04/18/2017 01/12/2017 12/05/2015  Falls in the past year? 0 No No No No   Depression Screen PHQ 2/9 Scores 06/08/2019 06/06/2018 04/18/2017 01/12/2017  PHQ - 2 Score 0 0 0 0  PHQ- 9 Score - - 0 -     Cognitive Function     6CIT Screen 06/08/2019 06/06/2018 04/18/2017  What Year? 0 points 0 points 0 points  What month? 0 points 0 points 0 points  What time? 0 points 0 points 0 points  Count back from 20 0 points 0 points 0 points  Months in reverse 0 points 0 points 0 points  Repeat phrase 0 points 0 points 0 points  Total Score 0 0 0    Immunization History  Administered Date(s) Administered  . Influenza Split 09/10/2013  . Influenza, High Dose Seasonal PF 09/04/2018  . Influenza,inj,Quad PF,6+ Mos 07/25/2014  . Influenza-Unspecified 08/16/2012, 08/22/2013, 07/25/2014, 08/30/2015, 08/25/2016, 08/30/2017  . Pneumococcal Conjugate-13 03/19/2014  .  Pneumococcal Polysaccharide-23 05/31/2017  . Td 04/11/2013   Screening Tests Health Maintenance  Topic Date Due  . FOOT EXAM  09/02/2018  . DEXA SCAN  06/27/2020 (Originally 01/02/1999)  . INFLUENZA VACCINE  06/30/2019  . MAMMOGRAM  07/11/2019  . HEMOGLOBIN A1C  09/18/2019  . OPHTHALMOLOGY EXAM  01/23/2020  . TETANUS/TDAP  04/12/2023  . PNA vac Low Risk Adult  Completed      Plan:    End of life planning; Advance aging; Advanced directives discussed.  Copy of current HCPOA/Living Will requested.    I have personally reviewed and noted the following in the patient's chart:   . Medical and social history . Use of alcohol, tobacco or illicit drugs  . Current medications and supplements . Functional ability and status . Nutritional status . Physical activity . Advanced directives . List of other physicians . Hospitalizations, surgeries, and ER visits in previous 12 months . Vitals . Screenings to include cognitive, depression, and falls . Referrals and appointments  In addition, I have reviewed and discussed with patient certain preventive protocols, quality metrics, and best practice recommendations. A written personalized care plan for preventive services as well as general preventive health recommendations were provided to patient.     Varney Biles, LPN  [id number]/[id number]   Reviewed above information.  Agree with assessment and plan.    Dr Nicki Reaper

## 2019-06-20 ENCOUNTER — Other Ambulatory Visit: Payer: Self-pay | Admitting: Internal Medicine

## 2019-06-20 DIAGNOSIS — Z1231 Encounter for screening mammogram for malignant neoplasm of breast: Secondary | ICD-10-CM

## 2019-06-22 ENCOUNTER — Other Ambulatory Visit: Payer: Self-pay | Admitting: Internal Medicine

## 2019-06-26 ENCOUNTER — Other Ambulatory Visit: Payer: Self-pay

## 2019-06-27 ENCOUNTER — Ambulatory Visit (INDEPENDENT_AMBULATORY_CARE_PROVIDER_SITE_OTHER): Payer: Medicare Other | Admitting: Internal Medicine

## 2019-06-27 ENCOUNTER — Ambulatory Visit: Payer: Medicare Other

## 2019-06-27 VITALS — BP 120/70 | HR 65 | Temp 98.0°F | Resp 16 | Ht 60.0 in | Wt 136.4 lb

## 2019-06-27 DIAGNOSIS — R739 Hyperglycemia, unspecified: Secondary | ICD-10-CM

## 2019-06-27 DIAGNOSIS — K219 Gastro-esophageal reflux disease without esophagitis: Secondary | ICD-10-CM

## 2019-06-27 DIAGNOSIS — M810 Age-related osteoporosis without current pathological fracture: Secondary | ICD-10-CM

## 2019-06-27 DIAGNOSIS — R21 Rash and other nonspecific skin eruption: Secondary | ICD-10-CM

## 2019-06-27 DIAGNOSIS — I10 Essential (primary) hypertension: Secondary | ICD-10-CM | POA: Diagnosis not present

## 2019-06-27 DIAGNOSIS — R109 Unspecified abdominal pain: Secondary | ICD-10-CM

## 2019-06-27 DIAGNOSIS — E119 Type 2 diabetes mellitus without complications: Secondary | ICD-10-CM

## 2019-06-27 DIAGNOSIS — E7800 Pure hypercholesterolemia, unspecified: Secondary | ICD-10-CM

## 2019-06-27 DIAGNOSIS — Z0000 Encounter for general adult medical examination without abnormal findings: Secondary | ICD-10-CM | POA: Diagnosis not present

## 2019-06-27 MED ORDER — TRIAMCINOLONE ACETONIDE 0.1 % EX CREA: 1.0000 "application " | TOPICAL_CREAM | Freq: Two times a day (BID) | CUTANEOUS | 0 refills | Status: AC

## 2019-06-27 NOTE — Progress Notes (Signed)
Patient ID: Teresa Wade, female   DOB: 06-09-1932, 85 y.o.   MRN: [id number]   Subjective:    Patient ID: Teresa Wade, female    DOB: 1932-04-07, 85 y.o.   MRN: [id number]  HPI  Patient here for her physical exam.  She reports she is doing relatively well.  Has continued to have intermittent GI issues.  Seeing GI.  Notes reviewed.  Off budesonide now.  Has intermittent nausea and vomiting associated with abdominal pain.  Was given rx for compazine.  On nexium.  Has not had a bad flare in over one month.  Feeling better.  No chest pain.  No cough, chest congestion or sob.  Handling stress.  Planning to get married in two weeks.  Does report rash - lower legs.  Some minimal itching at times.     Past Medical History:  Diagnosis Date   Abnormal chest CT    right hilar cyst   Anxiety    Fibrocystic disease of breast    GERD (gastroesophageal reflux disease)    History of thrombocytopenia    Hypercholesterolemia    Hyperglycemia    Hypertension    Osteoarthritis    Osteoporosis    s/p Actonel, Reclast (last 2011)   Palpitations    Pre-diabetes    Psoriasis    Reflux esophagitis    Past Surgical History:  Procedure Laterality Date   ABDOMINAL HYSTERECTOMY  1980   BREAST EXCISIONAL BIOPSY Left 1976   benign   COLONOSCOPY WITH PROPOFOL N/A 05/05/2018   Procedure: COLONOSCOPY WITH PROPOFOL;  Surgeon: Manya Silvas, MD;  Location: [facility] ENDOSCOPY;  Service: Endoscopy;  Laterality: N/A;   ESOPHAGOGASTRODUODENOSCOPY (EGD) WITH PROPOFOL N/A 05/05/2018   Procedure: ESOPHAGOGASTRODUODENOSCOPY (EGD) WITH PROPOFOL;  Surgeon: Manya Silvas, MD;  Location: [facility] ENDOSCOPY;  Service: Endoscopy;  Laterality: N/A;   THUMB AMPUTATION Left 2015   Family History  Problem Relation Age of Onset   Heart disease Mother    Hypertension Mother    Mental illness Other        sibling, suicide   Colon cancer Sister    Sleep apnea Brother    Social History    Socioeconomic History   Marital status: Widowed    Spouse name: Not on file   Number of children: 1   Years of education: Not on file   Highest education level: Not on file  Occupational History   Not on file  Social Needs   Financial resource strain: Not hard at all   Food insecurity    Worry: Never true    Inability: Never true   Transportation needs    Medical: No    Non-medical: No  Tobacco Use   Smoking status: Former Smoker    Quit date: 1999    Years since quitting: 21.6   Smokeless tobacco: Never Used  Substance and Sexual Activity   Alcohol use: No    Alcohol/week: 0.0 standard drinks   Drug use: No   Sexual activity: Never  Lifestyle   Physical activity    Days per week: 2 days    Minutes per session: 30 min   Stress: Not at all  Relationships   Social connections    Talks on phone: Not on file    Gets together: Not on file    Attends religious service: Not on file    Active member of club or organization: Not on file    Attends meetings of clubs or organizations: Not  on file    Relationship status: Not on file  Other Topics Concern   Not on file  Social History Narrative   Not on file    Outpatient Encounter Medications as of 06/27/2019  Medication Sig   acebutolol (SECTRAL) 200 MG capsule TAKE 2 CAPSULES BY MOUTH EVERY MORNING & 1 CAPSULE EVERY EVENING   aspirin 81 MG tablet Take 81 mg by mouth daily.   calcium carbonate (OS-CAL) 1250 MG chewable tablet Chew 1 tablet by mouth 2 (two) times daily.   calcium elemental as carbonate (TUMS ULTRA 1000) 400 MG chewable tablet Chew by mouth.   clobetasol ointment (TEMOVATE) 1.91 % Apply 1 application topically 2 (two) times daily.   esomeprazole (NEXIUM) 40 MG capsule TAKE ONE CAPSULE BY MOUTH TWICE A DAY (INS WILL ONLY COVER 1 PER DAY- GETTING PA)   ipratropium (ATROVENT) 0.03 % nasal spray INSTILL 1 TO 2 SPRAYS INTO EACH NOSTRIL UP TO 3 TIMES A DAY FOR DRAINAGE   losartan  (COZAAR) 100 MG tablet TAKE 1 TABLET BY MOUTH EVERY DAY   magnesium oxide (MAG-OX) 400 MG tablet Take 400 mg by mouth daily.   nitroGLYCERIN (NITROSTAT) 0.4 MG SL tablet Place 1 tablet (0.4 mg total) under the tongue every 5 (five) minutes as needed for chest pain. May repeat x 1.  If persistent pain, call 911   prochlorperazine (COMPAZINE) 10 MG tablet TAKE 1 TABLET (10 MG TOTAL) BY MOUTH EVERY 6 (SIX) HOURS AS NEEDED FOR NAUSEA OR VOMITING   simvastatin (ZOCOR) 40 MG tablet TAKE 1 TABLET BY MOUTH EVERYDAY AT BEDTIME   TACLONEX external suspension    triamcinolone cream (KENALOG) 0.1 % Apply 1 application topically 2 (two) times daily.   [DISCONTINUED] budesonide (ENTOCORT EC) 3 MG 24 hr capsule TAKE 3 CAPSULES BY MOUTH IN THE MORNING   No facility-administered encounter medications on file as of 06/27/2019.     Review of Systems  Constitutional: Negative for appetite change and unexpected weight change.  HENT: Negative for congestion and sinus pressure.   Eyes: Negative for pain and visual disturbance.  Respiratory: Negative for cough, chest tightness and shortness of breath.   Cardiovascular: Negative for chest pain, palpitations and leg swelling.  Gastrointestinal: Negative for constipation, diarrhea and vomiting.       Intermittent abdominal pain, nausea and vomiting as outlined.  Seeing GI.  Has had extensive GI w/up.    Genitourinary: Negative for difficulty urinating and dysuria.  Musculoskeletal: Negative for joint swelling and myalgias.  Skin: Positive for rash. Negative for color change.       Rash lower extremities.    Neurological: Negative for dizziness, light-headedness and headaches.  Hematological: Negative for adenopathy. Does not bruise/bleed easily.  Psychiatric/Behavioral: Negative for agitation and dysphoric mood.       Objective:    Physical Exam Constitutional:      General: She is not in acute distress.    Appearance: Normal appearance. She is  well-developed.  HENT:     Right Ear: External ear normal.     Left Ear: External ear normal.  Eyes:     General: No scleral icterus.       Right eye: No discharge.        Left eye: No discharge.     Conjunctiva/sclera: Conjunctivae normal.  Neck:     Musculoskeletal: Neck supple. No muscular tenderness.     Thyroid: No thyromegaly.  Cardiovascular:     Rate and Rhythm: Normal rate and regular rhythm.  Pulmonary:     Effort: No tachypnea, accessory muscle usage or respiratory distress.     Breath sounds: Normal breath sounds. No decreased breath sounds or wheezing.  Chest:     Breasts:        Right: No inverted nipple, mass, nipple discharge or tenderness (no axillary adenopathy).        Left: No inverted nipple, mass, nipple discharge or tenderness (no axilarry adenopathy).  Abdominal:     General: Bowel sounds are normal.     Palpations: Abdomen is soft.     Tenderness: There is no abdominal tenderness.  Musculoskeletal:        General: No swelling or tenderness.  Lymphadenopathy:     Cervical: No cervical adenopathy.  Skin:    Comments: Erythematous rash - lower extremities.    Neurological:     Mental Status: She is alert and oriented to person, place, and time.  Psychiatric:        Mood and Affect: Mood normal.        Behavior: Behavior normal.     BP 120/70    Pulse 65    Temp 98 F (36.7 C) (Oral)    Resp 16    Ht 5' (1.524 m)    Wt 136 lb 6.4 oz (61.9 kg)    LMP 11/09/1979    SpO2 98%    BMI 26.64 kg/m  Wt Readings from Last 3 Encounters:  06/27/19 136 lb 6.4 oz (61.9 kg)  12/12/18 139 lb 3.2 oz (63.1 kg)  08/22/18 138 lb 12.8 oz (63 kg)     Lab Results  Component Value Date   WBC 8.3 12/11/2018   HGB 12.4 12/11/2018   HCT 37.3 12/11/2018   PLT 195.0 12/11/2018   GLUCOSE 105 (H) 03/19/2019   CHOL 130 03/19/2019   TRIG 104.0 03/19/2019   HDL 48.50 03/19/2019   LDLDIRECT 75.0 01/06/2018   LDLCALC 61 03/19/2019   ALT 14 03/19/2019   AST 18 03/19/2019    NA 141 03/19/2019   K 4.4 03/19/2019   CL 104 03/19/2019   CREATININE 0.85 03/19/2019   BUN 17 03/19/2019   CO2 31 03/19/2019   TSH 1.35 07/14/2018   HGBA1C 6.3 03/19/2019   MICROALBUR 5.4 (H) 07/14/2018    Ct Abdomen Pelvis W Contrast  Result Date: 07/21/2018 CLINICAL DATA:  Persistent abdominal pain and diarrhea with history of lymphocytic colitis EXAM: CT ABDOMEN AND PELVIS WITH CONTRAST TECHNIQUE: Multidetector CT imaging of the abdomen and pelvis was performed using the standard protocol following bolus administration of intravenous contrast. CONTRAST:  164m ISOVUE-300 IOPAMIDOL (ISOVUE-300) INJECTION 61% COMPARISON:  12/16/2017 FINDINGS: Lower chest: No acute abnormality. Hepatobiliary: Gallbladder is decompressed. Fatty infiltration of the liver is noted. Pancreas: Unremarkable. No pancreatic ductal dilatation or surrounding inflammatory changes. Spleen: Normal in size without focal abnormality. Adrenals/Urinary Tract: The adrenal glands are within normal limits. Kidneys demonstrate no renal calculi or obstructive changes. Bladder is partially distended. Stomach/Bowel: The appendix is within normal limits. Scattered diverticular change of the colon is noted without evidence of diverticulitis. Diffuse small-bowel diverticulosis is noted as well. No obstructive or inflammatory changes are noted. Vascular/Lymphatic: Aortic atherosclerosis. No enlarged abdominal or pelvic lymph nodes. Reproductive: Status post hysterectomy. No adnexal masses. Other: No abdominal wall hernia or abnormality. No abdominopelvic ascites. Musculoskeletal: Degenerative changes of the lumbar spine are noted. No acute bony abnormality is seen. IMPRESSION: Scattered colonic and small bowel diverticula without evidence of diverticulitis. The small bowel diverticula are more  prevalent than that seen on the prior exam. Chronic changes as described above.  No acute abnormality is noted. Electronically Signed   By: Inez Catalina  M.D.   On: 07/21/2018 11:45       Assessment & Plan:   Problem List Items Addressed This Visit    Abdominal pain    Has had intermittent flares associated with nausea and vomiting.  Seeing GI.  Has had extensive GI w/up.  Has been treated for SIBO.  Has compazine to take prn.  No recent flare in over one month.  Follow.  Feels better.        Diabetes ([place])    Low carb diet and exercise.  Follow met b and a1c.        Relevant Orders   Hemoglobin W3U   Basic metabolic panel   Microalbumin / creatinine urine ratio   Essential hypertension    Blood pressure has been doing well.  Follow pressures.  Follow metabolic panel.        Relevant Orders   TSH   GERD (gastroesophageal reflux disease)    On nexium.  Controlled.        Health care maintenance    Physical today 06/27/19.  Mammogram 07/10/18 - Birads I.  Mammogram scheduled for 07/25/19.    Colonoscopy 05/05/18 - internal hemorrhoids, diverticulosis.  Schedule bone density.        Hypercholesterolemia    On simvastatin.  Low cholesterol diet and exercise.  Follow lipid panel and liver function tests.        Relevant Orders   Hepatic function panel   Lipid panel   Hyperglycemia   Osteoporosis    Schedule f/u bone density.        Relevant Orders   DG Bone Density   Rash    Erythematous rash - lower extremities.  TCC .1% as directed.  Follow.  Notify me if persistent.            Einar Pheasant, MD

## 2019-07-01 ENCOUNTER — Encounter: Payer: Self-pay | Admitting: Internal Medicine

## 2019-07-01 DIAGNOSIS — R21 Rash and other nonspecific skin eruption: Secondary | ICD-10-CM | POA: Insufficient documentation

## 2019-07-01 NOTE — Assessment & Plan Note (Signed)
Erythematous rash - lower extremities.  TCC .1% as directed.  Follow.  Notify me if persistent.

## 2019-07-01 NOTE — Assessment & Plan Note (Signed)
On simvastatin.  Low cholesterol diet and exercise.  Follow lipid panel and liver function tests.   

## 2019-07-01 NOTE — Assessment & Plan Note (Signed)
Blood pressure has been doing well.  Follow pressures.  Follow metabolic panel.   

## 2019-07-01 NOTE — Assessment & Plan Note (Signed)
On nexium.  Controlled.

## 2019-07-01 NOTE — Assessment & Plan Note (Signed)
Schedule f/u bone density.

## 2019-07-01 NOTE — Assessment & Plan Note (Addendum)
Physical today 06/27/19.  Mammogram 07/10/18 - Birads I.  Mammogram scheduled for 07/25/19.    Colonoscopy 05/05/18 - internal hemorrhoids, diverticulosis.  Schedule bone density.

## 2019-07-01 NOTE — Assessment & Plan Note (Signed)
Low carb diet and exercise.  Follow met b and a1c.   

## 2019-07-01 NOTE — Assessment & Plan Note (Signed)
Has had intermittent flares associated with nausea and vomiting.  Seeing GI.  Has had extensive GI w/up.  Has been treated for SIBO.  Has compazine to take prn.  No recent flare in over one month.  Follow.  Feels better.

## 2019-07-05 ENCOUNTER — Encounter: Payer: Self-pay | Admitting: Internal Medicine

## 2019-08-08 ENCOUNTER — Other Ambulatory Visit: Payer: Self-pay

## 2019-08-08 ENCOUNTER — Other Ambulatory Visit (INDEPENDENT_AMBULATORY_CARE_PROVIDER_SITE_OTHER): Payer: Medicare Other

## 2019-08-08 DIAGNOSIS — E119 Type 2 diabetes mellitus without complications: Secondary | ICD-10-CM

## 2019-08-08 DIAGNOSIS — E7800 Pure hypercholesterolemia, unspecified: Secondary | ICD-10-CM

## 2019-08-08 DIAGNOSIS — I10 Essential (primary) hypertension: Secondary | ICD-10-CM

## 2019-08-08 DIAGNOSIS — Z23 Encounter for immunization: Secondary | ICD-10-CM

## 2019-08-08 LAB — LIPID PANEL
Cholesterol: 134 mg/dL (ref 0–200)
HDL: 42.3 mg/dL (ref >39.00)
LDL Cholesterol: 65 mg/dL (ref 0–99)
NonHDL: 91.65
Total CHOL/HDL Ratio: 3
Triglycerides: 133 mg/dL (ref 0.0–149.0)
VLDL: 26.6 mg/dL (ref 0.0–40.0)

## 2019-08-08 LAB — BASIC METABOLIC PANEL
BUN: 19 mg/dL (ref 6–23)
CO2: 28 mEq/L (ref 19–32)
Calcium: 9.2 mg/dL (ref 8.4–10.5)
Chloride: 103 mEq/L (ref 96–112)
Creatinine, Ser: 0.91 mg/dL (ref 0.40–1.20)
GFR: 58.67 mL/min — ABNORMAL LOW (ref >60.00)
Glucose, Bld: 94 mg/dL (ref 70–99)
Potassium: 4.4 mEq/L (ref 3.5–5.1)
Sodium: 139 mEq/L (ref 135–145)

## 2019-08-08 LAB — HEPATIC FUNCTION PANEL
ALT: 12 U/L (ref 0–35)
AST: 16 U/L (ref 0–37)
Albumin: 3.8 g/dL (ref 3.5–5.2)
Alkaline Phosphatase: 72 U/L (ref 39–117)
Bilirubin, Direct: 0.1 mg/dL (ref 0.0–0.3)
Total Bilirubin: 0.6 mg/dL (ref 0.2–1.2)
Total Protein: 6.4 g/dL (ref 6.0–8.3)

## 2019-08-08 LAB — HEMOGLOBIN A1C: Hgb A1c MFr Bld: 6.3 % (ref 4.6–6.5)

## 2019-08-08 LAB — MICROALBUMIN / CREATININE URINE RATIO
Creatinine,U: 178.6 mg/dL
Microalb Creat Ratio: 0.4 mg/g (ref 0.0–30.0)
Microalb, Ur: <0.7 mg/dL (ref 0.0–1.9)

## 2019-08-08 LAB — TSH: TSH: 1.1 u[IU]/mL (ref 0.35–4.50)

## 2019-08-09 ENCOUNTER — Encounter: Payer: Self-pay | Admitting: *Deleted

## 2019-08-12 ENCOUNTER — Other Ambulatory Visit: Payer: Self-pay | Admitting: Internal Medicine

## 2019-08-15 ENCOUNTER — Other Ambulatory Visit: Payer: Self-pay | Admitting: Internal Medicine

## 2019-08-16 ENCOUNTER — Other Ambulatory Visit: Payer: Self-pay

## 2019-08-16 ENCOUNTER — Other Ambulatory Visit: Payer: Self-pay | Admitting: Internal Medicine

## 2019-08-16 MED ORDER — ESOMEPRAZOLE MAGNESIUM 40 MG PO CPDR: DELAYED_RELEASE_CAPSULE | ORAL | 1 refills | Status: DC

## 2019-08-16 NOTE — Progress Notes (Signed)
Orders placed for nexium - 40mg  q day.

## 2019-08-16 NOTE — Telephone Encounter (Signed)
Called patient and she stated she is taking Nexium 20 mg once daily.  Teresa Wade,cma

## 2019-08-16 NOTE — Telephone Encounter (Signed)
Refill sent to pharmacy.  Briea Mcenery,cma 

## 2019-08-16 NOTE — Telephone Encounter (Signed)
Looking at the medication list. It looks like she is taking Nexium not omeprazole.  Last OV: 06/27/2019 Next OV: 10/29/2019

## 2019-08-16 NOTE — Telephone Encounter (Signed)
See rx note.  Please clarify with pt what medication she is taking.  Omeprazole or nexium.

## 2019-09-21 ENCOUNTER — Other Ambulatory Visit: Payer: Self-pay | Admitting: Internal Medicine

## 2019-09-22 ENCOUNTER — Other Ambulatory Visit: Payer: Self-pay | Admitting: Internal Medicine

## 2019-10-01 ENCOUNTER — Other Ambulatory Visit: Payer: Self-pay | Admitting: Internal Medicine

## 2019-10-02 ENCOUNTER — Ambulatory Visit
Admission: RE | Admit: 2019-10-02 | Discharge: 2019-10-02 | Disposition: A | Discharge status: Home / Self Care | Payer: Medicare Other | Source: Ambulatory Visit | Attending: Internal Medicine | Admitting: Internal Medicine

## 2019-10-02 DIAGNOSIS — Z1231 Encounter for screening mammogram for malignant neoplasm of breast: Secondary | ICD-10-CM | POA: Insufficient documentation

## 2019-10-29 ENCOUNTER — Encounter: Payer: Self-pay | Admitting: Internal Medicine

## 2019-10-29 ENCOUNTER — Ambulatory Visit (INDEPENDENT_AMBULATORY_CARE_PROVIDER_SITE_OTHER): Payer: Medicare Other | Admitting: Internal Medicine

## 2019-10-29 ENCOUNTER — Other Ambulatory Visit: Payer: Self-pay

## 2019-10-29 DIAGNOSIS — E7800 Pure hypercholesterolemia, unspecified: Secondary | ICD-10-CM

## 2019-10-29 DIAGNOSIS — I10 Essential (primary) hypertension: Secondary | ICD-10-CM | POA: Diagnosis not present

## 2019-10-29 DIAGNOSIS — E119 Type 2 diabetes mellitus without complications: Secondary | ICD-10-CM | POA: Diagnosis not present

## 2019-10-29 DIAGNOSIS — K219 Gastro-esophageal reflux disease without esophagitis: Secondary | ICD-10-CM | POA: Diagnosis not present

## 2019-10-29 DIAGNOSIS — K52832 Lymphocytic colitis: Secondary | ICD-10-CM

## 2019-10-29 DIAGNOSIS — R109 Unspecified abdominal pain: Secondary | ICD-10-CM | POA: Diagnosis not present

## 2019-10-29 DIAGNOSIS — R739 Hyperglycemia, unspecified: Secondary | ICD-10-CM

## 2019-10-29 NOTE — Progress Notes (Signed)
Patient ID: Teresa Wade, female   DOB: 07-06-1932, 85 y.o.   MRN: [id number]   Virtual Visit via telephone Note  This visit type was conducted due to national recommendations for restrictions regarding the COVID-19 pandemic (e.g. social distancing).  This format is felt to be most appropriate for this patient at this time.  All issues noted in this document were discussed and addressed.  No physical exam was performed (except for noted visual exam findings with Video Visits).   I connected with Teresa Wade by telephone and verified that I am speaking with the correct person using two identifiers. Location patient: home Location provider: work Persons participating in the telephone visit: patient, provider  I discussed the limitations, risks, security and privacy concerns of performing an evaluation and management service by telephone and the availability of in person appointments. The patient expressed understanding and agreed to proceed.   Reason for visit: scheduled follow up  HPI: She reports she is doing relatively well.  Staying in secondary to covid restrictions.  Trying to stay active.  No chest pain.  No sob.  Has been seeing GI for work up - intermittent abdominal pain, nausea and vomiting.  Off budesonide. Still with occasional episodes, but overall she feels is better/less frequent.  May occur 2x/month.  States that bending and doing yard work - aggravates.  Desires no further w/up or intervention.  Just got married.  Doing well.  Blood pressure 094-709 systolic.  Last recorded check 137/86.     ROS: See pertinent positives and negatives per HPI.  Past Medical History:  Diagnosis Date  . Abnormal chest CT    right hilar cyst  . Anxiety   . Fibrocystic disease of breast   . GERD (gastroesophageal reflux disease)   . History of thrombocytopenia   . Hypercholesterolemia   . Hyperglycemia   . Hypertension   . Osteoarthritis   . Osteoporosis    s/p Actonel, Reclast  (last 2011)  . Palpitations   . Pre-diabetes   . Psoriasis   . Reflux esophagitis     Past Surgical History:  Procedure Laterality Date  . ABDOMINAL HYSTERECTOMY  1980  . BREAST EXCISIONAL BIOPSY Left 1976   benign  . COLONOSCOPY WITH PROPOFOL N/A 05/05/2018   Procedure: COLONOSCOPY WITH PROPOFOL;  Surgeon: Manya Silvas, MD;  Location: [facility] ENDOSCOPY;  Service: Endoscopy;  Laterality: N/A;  . ESOPHAGOGASTRODUODENOSCOPY (EGD) WITH PROPOFOL N/A 05/05/2018   Procedure: ESOPHAGOGASTRODUODENOSCOPY (EGD) WITH PROPOFOL;  Surgeon: Manya Silvas, MD;  Location: [facility] ENDOSCOPY;  Service: Endoscopy;  Laterality: N/A;  . THUMB AMPUTATION Left 2015    Family History  Problem Relation Age of Onset  . Heart disease Mother   . Hypertension Mother   . Mental illness Other        sibling, suicide  . Colon cancer Sister   . Sleep apnea Brother   . Breast cancer Neg Hx     SOCIAL HX: reviewed.    Current Outpatient Medications:  .  acebutolol (SECTRAL) 200 MG capsule, TAKE 2 CAPSULES BY MOUTH EVERY MORNING & 1 CAPSULE EVERY EVENING, Disp: 270 capsule, Rfl: 2 .  aspirin 81 MG tablet, Take 81 mg by mouth daily., Disp: , Rfl:  .  calcium carbonate (OS-CAL) 1250 MG chewable tablet, Chew 1 tablet by mouth 2 (two) times daily., Disp: , Rfl:  .  calcium elemental as carbonate (TUMS ULTRA 1000) 400 MG chewable tablet, Chew by mouth., Disp: , Rfl:  .  clobetasol  ointment (TEMOVATE) 4.48 %, Apply 1 application topically 2 (two) times daily., Disp: , Rfl:  .  esomeprazole (NEXIUM) 40 MG capsule, TAKE ONE CAPSULE BY Mouth daily., Disp: 90 capsule, Rfl: 1 .  ipratropium (ATROVENT) 0.03 % nasal spray, INSTILL 1 TO 2 SPRAYS INTO EACH NOSTRIL UP TO 3 TIMES A DAY FOR DRAINAGE, Disp: , Rfl: 12 .  losartan (COZAAR) 100 MG tablet, TAKE 1 TABLET BY MOUTH EVERY DAY, Disp: 90 tablet, Rfl: 1 .  magnesium oxide (MAG-OX) 400 MG tablet, Take 400 mg by mouth daily., Disp: , Rfl:  .  nitroGLYCERIN (NITROSTAT) 0.4 MG  SL tablet, Place 1 tablet (0.4 mg total) under the tongue every 5 (five) minutes as needed for chest pain. May repeat x 1.  If persistent pain, call 911, Disp: 25 tablet, Rfl: 0 .  prochlorperazine (COMPAZINE) 10 MG tablet, TAKE 1 TABLET (10 MG TOTAL) BY MOUTH EVERY 6 (SIX) HOURS AS NEEDED FOR NAUSEA OR VOMITING, Disp: , Rfl:  .  simvastatin (ZOCOR) 40 MG tablet, TAKE 1 TABLET BY MOUTH EVERYDAY AT BEDTIME, Disp: 90 tablet, Rfl: 0 .  TACLONEX external suspension, , Disp: , Rfl: 2 .  triamcinolone cream (KENALOG) 0.1 %, Apply 1 application topically 2 (two) times daily., Disp: 45 g, Rfl: 0  EXAM:  VITALS per patient if applicable: 185/63  GENERAL: alert.  Sounds to be in no acute distress.  Answering questions appropriately.    PSYCH/NEURO: pleasant and cooperative, no obvious depression or anxiety, speech and thought processing grossly intact  ASSESSMENT AND PLAN:  Discussed the following assessment and plan:  Abdominal pain Has seen GI.  Has had extensive w/up.  She does report episodes are less frequent.  Still with intermittent flares as outlined.  Desires no further intervention.  Follow.    Diabetes Continue low carb diet and exercise.  Follow met b and a1c.    Essential hypertension Blood pressure as outlined.  Have her spot check her pressure.  Continue current medication.  Follow pressures.  Follow metabolic panel.   GERD (gastroesophageal reflux disease) On nexium.  Seeing GI.   Hypercholesterolemia Low cholesterol diet and exercise.  On simvastatin.  Follow lipid panel and liver function tests.    Hyperglycemia Low carb diet and exercise.  Follow met b and a1c.   Lymphocytic colitis Seeing GI.      I discussed the assessment and treatment plan with the patient. The patient was provided an opportunity to ask questions and all were answered. The patient agreed with the plan and demonstrated an understanding of the instructions.   The patient was advised to call back  or seek an in-person evaluation if the symptoms worsen or if the condition fails to improve as anticipated.  I provided 15 minutes of non-face-to-face time during this encounter.   Einar Pheasant, MD

## 2019-11-03 ENCOUNTER — Encounter: Payer: Self-pay | Admitting: Internal Medicine

## 2019-11-03 NOTE — Assessment & Plan Note (Signed)
On nexium.  Seeing GI.

## 2019-11-03 NOTE — Assessment & Plan Note (Signed)
Seeing GI 

## 2019-11-03 NOTE — Assessment & Plan Note (Signed)
Low cholesterol diet and exercise.  On simvastatin.  Follow lipid panel and liver function tests.   

## 2019-11-03 NOTE — Assessment & Plan Note (Signed)
Low carb diet and exercise.  Follow met b and a1c.  

## 2019-11-03 NOTE — Assessment & Plan Note (Signed)
Continue low carb diet and exercise.  Follow met b and a1c.  

## 2019-11-03 NOTE — Assessment & Plan Note (Signed)
Has seen GI.  Has had extensive w/up.  She does report episodes are less frequent.  Still with intermittent flares as outlined.  Desires no further intervention.  Follow.

## 2019-11-03 NOTE — Assessment & Plan Note (Signed)
Blood pressure as outlined.  Have her spot check her pressure.  Continue current medication.  Follow pressures.  Follow metabolic panel.

## 2019-11-11 ENCOUNTER — Other Ambulatory Visit: Payer: Self-pay | Admitting: Internal Medicine

## 2019-11-21 NOTE — Telephone Encounter (Signed)
Please clarify with pt if needs and how she is using.  I do not mind refilling, but want to confirm not using daily in same place.

## 2019-11-21 NOTE — Telephone Encounter (Signed)
Pt has been using daily. She is not going to use until she sees dermatology.

## 2019-12-17 ENCOUNTER — Other Ambulatory Visit: Payer: Self-pay

## 2019-12-19 ENCOUNTER — Other Ambulatory Visit (INDEPENDENT_AMBULATORY_CARE_PROVIDER_SITE_OTHER): Payer: Medicare Other

## 2019-12-19 ENCOUNTER — Other Ambulatory Visit: Payer: Self-pay

## 2019-12-19 DIAGNOSIS — E7800 Pure hypercholesterolemia, unspecified: Secondary | ICD-10-CM | POA: Diagnosis not present

## 2019-12-19 DIAGNOSIS — I10 Essential (primary) hypertension: Secondary | ICD-10-CM

## 2019-12-19 DIAGNOSIS — E119 Type 2 diabetes mellitus without complications: Secondary | ICD-10-CM

## 2019-12-19 LAB — LIPID PANEL
Cholesterol: 129 mg/dL (ref 0–200)
HDL: 55.1 mg/dL (ref >39.00)
LDL Cholesterol: 58 mg/dL (ref 0–99)
NonHDL: 73.46
Total CHOL/HDL Ratio: 2
Triglycerides: 75 mg/dL (ref 0.0–149.0)
VLDL: 15 mg/dL (ref 0.0–40.0)

## 2019-12-19 LAB — CBC WITH DIFFERENTIAL/PLATELET
Basophils Absolute: 0.1 10*3/uL (ref 0.0–0.1)
Basophils Relative: 1 % (ref 0.0–3.0)
Eosinophils Absolute: 0.1 10*3/uL (ref 0.0–0.7)
Eosinophils Relative: 1.1 % (ref 0.0–5.0)
HCT: 36.5 % (ref 36.0–46.0)
Hemoglobin: 12.1 g/dL (ref 12.0–15.0)
Lymphocytes Relative: 38.5 % (ref 12.0–46.0)
Lymphs Abs: 2 10*3/uL (ref 0.7–4.0)
MCHC: 33.1 g/dL (ref 30.0–36.0)
MCV: 92.3 fl (ref 78.0–100.0)
Monocytes Absolute: 0.6 10*3/uL (ref 0.1–1.0)
Monocytes Relative: 11.6 % (ref 3.0–12.0)
Neutro Abs: 2.5 10*3/uL (ref 1.4–7.7)
Neutrophils Relative %: 47.8 % (ref 43.0–77.0)
Platelets: 185 10*3/uL (ref 150.0–400.0)
RBC: 3.96 Mil/uL (ref 3.87–5.11)
RDW: 13.6 % (ref 11.5–15.5)
WBC: 5.3 10*3/uL (ref 4.0–10.5)

## 2019-12-19 LAB — HEPATIC FUNCTION PANEL
ALT: 16 U/L (ref 0–35)
AST: 23 U/L (ref 0–37)
Albumin: 4.1 g/dL (ref 3.5–5.2)
Alkaline Phosphatase: 74 U/L (ref 39–117)
Bilirubin, Direct: 0.1 mg/dL (ref 0.0–0.3)
Total Bilirubin: 0.6 mg/dL (ref 0.2–1.2)
Total Protein: 6.7 g/dL (ref 6.0–8.3)

## 2019-12-19 LAB — BASIC METABOLIC PANEL
BUN: 23 mg/dL (ref 6–23)
CO2: 31 mEq/L (ref 19–32)
Calcium: 9.6 mg/dL (ref 8.4–10.5)
Chloride: 106 mEq/L (ref 96–112)
Creatinine, Ser: 0.88 mg/dL (ref 0.40–1.20)
GFR: 60.93 mL/min (ref >60.00)
Glucose, Bld: 103 mg/dL — ABNORMAL HIGH (ref 70–99)
Potassium: 4.4 mEq/L (ref 3.5–5.1)
Sodium: 141 mEq/L (ref 135–145)

## 2019-12-19 LAB — HEMOGLOBIN A1C: Hgb A1c MFr Bld: 5.9 % (ref 4.6–6.5)

## 2019-12-21 ENCOUNTER — Ambulatory Visit (INDEPENDENT_AMBULATORY_CARE_PROVIDER_SITE_OTHER): Payer: Medicare Other | Admitting: Internal Medicine

## 2019-12-21 ENCOUNTER — Other Ambulatory Visit: Payer: Self-pay

## 2019-12-21 DIAGNOSIS — K219 Gastro-esophageal reflux disease without esophagitis: Secondary | ICD-10-CM | POA: Diagnosis not present

## 2019-12-21 DIAGNOSIS — I10 Essential (primary) hypertension: Secondary | ICD-10-CM

## 2019-12-21 DIAGNOSIS — E119 Type 2 diabetes mellitus without complications: Secondary | ICD-10-CM

## 2019-12-21 DIAGNOSIS — R109 Unspecified abdominal pain: Secondary | ICD-10-CM | POA: Diagnosis not present

## 2019-12-21 DIAGNOSIS — E7800 Pure hypercholesterolemia, unspecified: Secondary | ICD-10-CM

## 2019-12-21 DIAGNOSIS — R739 Hyperglycemia, unspecified: Secondary | ICD-10-CM

## 2019-12-21 MED ORDER — AMLODIPINE BESYLATE 5 MG PO TABS: 5.0000 mg | ORAL_TABLET | Freq: Every day | ORAL | 2 refills | Status: DC

## 2019-12-21 NOTE — Progress Notes (Addendum)
Patient ID: Teresa Wade, female   DOB: Jan 05, 1932, 86 y.o.   MRN: [id number]   Virtual Visit via telephone Note  This visit type was conducted due to national recommendations for restrictions regarding the COVID-19 pandemic (e.g. social distancing).  This format is felt to be most appropriate for this patient at this time.  All issues noted in this document were discussed and addressed.  No physical exam was performed (except for noted visual exam findings with Video Visits).   I connected with Teresa Wade by a telephone and verified that I am speaking with the correct person using two identifiers. Location patient: home Location provider: work or home office Persons participating in the telephone visit: patient, provider  I discussed the limitations, risks, security and privacy concerns of performing an evaluation and management service by telephone and the availability of in person appointments have The patient expressed understanding and agreed to proceed.   Reason for visit: scheduled follow up.    HPI: She reports she is doing well.  Feels good.  Trying to stay active.  No chest pain.  No increased sob. No acid reflux.  The GI flares improved.  States no flares for the last 37 days.  Feels better.  Watching what she eats.  No nausea or vomiting.  Blood pressure remaining elevated.  Blood pressures vary - 270-350 systolic.  Most averaging in 150s.  No headache or light headedness.  Overall feels better. Taking nexium.  Discussed labs.     ROS: See pertinent positives and negatives per HPI.  Past Medical History:  Diagnosis Date  . Abnormal chest CT    right hilar cyst  . Anxiety   . Fibrocystic disease of breast   . GERD (gastroesophageal reflux disease)   . History of thrombocytopenia   . Hypercholesterolemia   . Hyperglycemia   . Hypertension   . Osteoarthritis   . Osteoporosis    s/p Actonel, Reclast (last 2011)  . Palpitations   . Pre-diabetes   . Psoriasis   .  Reflux esophagitis     Past Surgical History:  Procedure Laterality Date  . ABDOMINAL HYSTERECTOMY  1980  . BREAST EXCISIONAL BIOPSY Left 1976   benign  . COLONOSCOPY WITH PROPOFOL N/A 05/05/2018   Procedure: COLONOSCOPY WITH PROPOFOL;  Surgeon: Manya Silvas, MD;  Location[facility] ENDOSCOPY;  Service: Endoscopy;  Laterality: N/A;  . ESOPHAGOGASTRODUODENOSCOPY (EGD) WITH PROPOFOL N/A 05/05/2018   Procedure: ESOPHAGOGASTRODUODENOSCOPY (EGD) WITH PROPOFOL;  Surgeon: Manya Silvas, MD;  Location: [facility] ENDOSCOPY;  Service: Endoscopy;  Laterality: N/A;  . THUMB AMPUTATION Left 2015    Family History  Problem Relation Age of Onset  . Heart disease Mother   . Hypertension Mother   . Mental illness Other        sibling, suicide  . Colon cancer Sister   . Sleep apnea Brother   . Breast cancer Neg Hx     SOCIAL HX: reviewed.    Current Outpatient Medications:  .  acebutolol (SECTRAL) 200 MG capsule, TAKE 2 CAPSULES BY MOUTH EVERY MORNING & 1 CAPSULE EVERY EVENING, Disp: 270 capsule, Rfl: 2 .  amLODipine (NORVASC) 5 MG tablet, Take 1 tablet (5 mg total) by mouth daily., Disp: 30 tablet, Rfl: 2 .  aspirin 81 MG tablet, Take 81 mg by mouth daily., Disp: , Rfl:  .  calcium carbonate (OS-CAL) 1250 MG chewable tablet, Chew 1 tablet by mouth 2 (two) times daily., Disp: , Rfl:  .  calcium elemental  as carbonate (TUMS ULTRA 1000) 400 MG chewable tablet, Chew by mouth., Disp: , Rfl:  .  clobetasol ointment (TEMOVATE) 8.25 %, Apply 1 application topically 2 (two) times daily., Disp: , Rfl:  .  esomeprazole (NEXIUM) 40 MG capsule, TAKE ONE CAPSULE BY Mouth daily., Disp: 90 capsule, Rfl: 1 .  ipratropium (ATROVENT) 0.03 % nasal spray, INSTILL 1 TO 2 SPRAYS INTO EACH NOSTRIL UP TO 3 TIMES A DAY FOR DRAINAGE, Disp: , Rfl: 12 .  losartan (COZAAR) 100 MG tablet, TAKE 1 TABLET BY MOUTH EVERY DAY, Disp: 90 tablet, Rfl: 1 .  magnesium oxide (MAG-OX) 400 MG tablet, Take 400 mg by mouth daily., Disp: , Rfl:   .  nitroGLYCERIN (NITROSTAT) 0.4 MG SL tablet, Place 1 tablet (0.4 mg total) under the tongue every 5 (five) minutes as needed for chest pain. May repeat x 1.  If persistent pain, call 911, Disp: 25 tablet, Rfl: 0 .  prochlorperazine (COMPAZINE) 10 MG tablet, TAKE 1 TABLET (10 MG TOTAL) BY MOUTH EVERY 6 (SIX) HOURS AS NEEDED FOR NAUSEA OR VOMITING, Disp: , Rfl:  .  simvastatin (ZOCOR) 40 MG tablet, TAKE 1 TABLET BY MOUTH EVERYDAY AT BEDTIME, Disp: 90 tablet, Rfl: 0 .  TACLONEX external suspension, , Disp: , Rfl: 2 .  triamcinolone cream (KENALOG) 0.1 %, Apply 1 application topically 2 (two) times daily., Disp: 45 g, Rfl: 0  EXAM:  VITALS per patient if applicable: 003/70  GENERAL: alert. Sounds to be in no acute distress.  Answering qeustions appropriately.   PSYCH/NEURO: pleasant and cooperative, no obvious depression or anxiety, speech and thought processing grossly intact  ASSESSMENT AND PLAN:  Discussed the following assessment and plan:  Abdominal pain Has seen GI.  Extensive w/up.  Episodes less frequent.  States no episode in 37 days.  Feels better.  Doing well.  Follow.    Diabetes Low carb diet and exercise.  Follow met b and a1c.    Essential hypertension Blood pressure elevated.  Add amlodipine.  Follow pressures.  Follow metabolic panel.  Schedule f/u soon to reassess.    GERD (gastroesophageal reflux disease) Controlled on nexium.   Hypercholesterolemia On simvastatin.  Low cholesterol diet and exercise.  Follow lipid panel and liver function tests.    Hyperglycemia Low carb diet and exercise. Follow met b and a1c.    Meds ordered this encounter  Medications  . amLODipine (NORVASC) 5 MG tablet    Sig: Take 1 tablet (5 mg total) by mouth daily.    Dispense:  30 tablet    Refill:  2     I discussed the assessment and treatment plan with the patient. The patient was provided an opportunity to ask questions and all were answered. The patient agreed with the  plan and demonstrated an understanding of the instructions.   The patient was advised to call back or seek an in-person evaluation if the symptoms worsen or if the condition fails to improve as anticipated.  I provided 23 minutes of non-face-to-face time during this encounter.   Einar Pheasant, MD

## 2019-12-23 ENCOUNTER — Encounter: Payer: Self-pay | Admitting: Internal Medicine

## 2019-12-23 NOTE — Assessment & Plan Note (Signed)
Low carb diet and exercise.  Follow met b and a1c.   

## 2019-12-23 NOTE — Assessment & Plan Note (Signed)
Controlled on nexium.  

## 2019-12-23 NOTE — Assessment & Plan Note (Signed)
On simvastatin.  Low cholesterol diet and exercise.  Follow lipid panel and liver function tests.   

## 2019-12-23 NOTE — Assessment & Plan Note (Signed)
Blood pressure elevated.  Add amlodipine.  Follow pressures.  Follow metabolic panel.  Schedule f/u soon to reassess.

## 2019-12-23 NOTE — Assessment & Plan Note (Signed)
Has seen GI.  Extensive w/up.  Episodes less frequent.  States no episode in 37 days.  Feels better.  Doing well.  Follow.

## 2019-12-23 NOTE — Assessment & Plan Note (Signed)
Low carb diet and exercise. Follow met b and a1c.  

## 2020-01-02 ENCOUNTER — Other Ambulatory Visit: Payer: Self-pay | Admitting: Internal Medicine

## 2020-01-24 LAB — HM DIABETES EYE EXAM

## 2020-03-18 ENCOUNTER — Other Ambulatory Visit: Payer: Self-pay | Admitting: Internal Medicine

## 2020-06-10 ENCOUNTER — Ambulatory Visit (INDEPENDENT_AMBULATORY_CARE_PROVIDER_SITE_OTHER): Payer: Medicare Other

## 2020-06-10 VITALS — Ht 60.0 in | Wt 136.0 lb

## 2020-06-10 DIAGNOSIS — Z0000 Encounter for general adult medical examination without abnormal findings: Secondary | ICD-10-CM | POA: Diagnosis not present

## 2020-06-10 NOTE — Patient Instructions (Addendum)
Teresa Wade , Thank you for taking time to come for your Medicare Wellness Visit. I appreciate your ongoing commitment to your health goals. Please review the following plan we discussed and let me know if I can assist you in the future.   These are the goals we discussed: Goals      Patient Stated   .  Follow up with Primary Care Provider (pt-stated)      Keep all routine maintenance appointments.        This is a list of the screening recommended for you and due dates:  Health Maintenance  Topic Date Due  . Complete foot exam   09/02/2018  . Hemoglobin A1C  06/17/2020  . Flu Shot  06/29/2020  . Mammogram  10/01/2020  . Eye exam for diabetics  01/23/2021  . Tetanus Vaccine  04/12/2023  . DEXA scan (bone density measurement)  Completed  . COVID-19 Vaccine  Completed  . Pneumonia vaccines  Completed    Immunizations Immunization History  Administered Date(s) Administered  . Fluad Quad(high Dose 65+) 08/08/2019  . Influenza Split 09/10/2013  . Influenza, High Dose Seasonal PF 09/04/2018  . Influenza,inj,Quad PF,6+ Mos 07/25/2014  . Influenza-Unspecified 08/16/2012, 08/22/2013, 07/25/2014, 08/30/2015, 08/25/2016, 08/30/2017  . PFIZER SARS-COV-2 Vaccination 12/04/2019, 12/25/2019  . Pneumococcal Conjugate-13 03/19/2014  . Pneumococcal Polysaccharide-23 05/31/2017  . Td 04/11/2013   Keep all routine maintenance appointments.   Follow up 08/08/20 @ 11:00  Advanced directives: End of life planning; Advance aging; Advanced directives discussed.  Copy of current HCPOA/Living Will requested.    Conditions/risks identified: none new  Follow up in one year for your annual wellness visit.   Preventive Care 36 Years and Older, Female Preventive care refers to lifestyle choices and visits with your health care provider that can promote health and wellness. What does preventive care include?  A yearly physical exam. This is also called an annual well check.  Dental exams  once or twice a year.  Routine eye exams. Ask your health care provider how often you should have your eyes checked.  Personal lifestyle choices, including:  Daily care of your teeth and gums.  Regular physical activity.  Eating a healthy diet.  Avoiding tobacco and drug use.  Limiting alcohol use.  Practicing safe sex.  Taking low-dose aspirin every day.  Taking vitamin and mineral supplements as recommended by your health care provider. What happens during an annual well check? The services and screenings done by your health care provider during your annual well check will depend on your age, overall health, lifestyle risk factors, and family history of disease. Counseling  Your health care provider may ask you questions about your:  Alcohol use.  Tobacco use.  Drug use.  Emotional well-being.  Home and relationship well-being.  Sexual activity.  Eating habits.  History of falls.  Memory and ability to understand (cognition).  Work and work Statistician.  Reproductive health. Screening  You may have the following tests or measurements:  Height, weight, and BMI.  Blood pressure.  Lipid and cholesterol levels. These may be checked every 5 years, or more frequently if you are over 54 years old.  Skin check.  Lung cancer screening. You may have this screening every year starting at age 38 if you have a 30-pack-year history of smoking and currently smoke or have quit within the past 15 years.  Fecal occult blood test (FOBT) of the stool. You may have this test every year starting at age 80.  Flexible sigmoidoscopy or colonoscopy. You may have a sigmoidoscopy every 5 years or a colonoscopy every 10 years starting at age 57.  Hepatitis C blood test.  Hepatitis B blood test.  Sexually transmitted disease (STD) testing.  Diabetes screening. This is done by checking your blood sugar (glucose) after you have not eaten for a while (fasting). You may have  this done every 1-3 years.  Bone density scan. This is done to screen for osteoporosis. You may have this done starting at age 28.  Mammogram. This may be done every 1-2 years. Talk to your health care provider about how often you should have regular mammograms. Talk with your health care provider about your test results, treatment options, and if necessary, the need for more tests. Vaccines  Your health care provider may recommend certain vaccines, such as:  Influenza vaccine. This is recommended every year.  Tetanus, diphtheria, and acellular pertussis (Tdap, Td) vaccine. You may need a Td booster every 10 years.  Zoster vaccine. You may need this after age 66.  Pneumococcal 13-valent conjugate (PCV13) vaccine. One dose is recommended after age 60.  Pneumococcal polysaccharide (PPSV23) vaccine. One dose is recommended after age 90. Talk to your health care provider about which screenings and vaccines you need and how often you need them. This information is not intended to replace advice given to you by your health care provider. Make sure you discuss any questions you have with your health care provider. Document Released: 12/12/2015 Document Revised: 08/04/2016 Document Reviewed: 09/16/2015 Elsevier Interactive Patient Education  2017 [employer] Prevention in the Home Falls can cause injuries. They can happen to people of all ages. There are many things you can do to make your home safe and to help prevent falls. What can I do on the outside of my home?  Regularly fix the edges of walkways and driveways and fix any cracks.  Remove anything that might make you trip as you walk through a door, such as a raised step or threshold.  Trim any bushes or trees on the path to your home.  Use bright outdoor lighting.  Clear any walking paths of anything that might make someone trip, such as rocks or tools.  Regularly check to see if handrails are loose or broken. Make sure  that both sides of any steps have handrails.  Any raised decks and porches should have guardrails on the edges.  Have any leaves, snow, or ice cleared regularly.  Use sand or salt on walking paths during winter.  Clean up any spills in your garage right away. This includes oil or grease spills. What can I do in the bathroom?  Use night lights.  Install grab bars by the toilet and in the tub and shower. Do not use towel bars as grab bars.  Use non-skid mats or decals in the tub or shower.  If you need to sit down in the shower, use a plastic, non-slip stool.  Keep the floor dry. Clean up any water that spills on the floor as soon as it happens.  Remove soap buildup in the tub or shower regularly.  Attach bath mats securely with double-sided non-slip rug tape.  Do not have throw rugs and other things on the floor that can make you trip. What can I do in the bedroom?  Use night lights.  Make sure that you have a light by your bed that is easy to reach.  Do not use any sheets or blankets  that are too big for your bed. They should not hang down onto the floor.  Have a firm chair that has side arms. You can use this for support while you get dressed.  Do not have throw rugs and other things on the floor that can make you trip. What can I do in the kitchen?  Clean up any spills right away.  Avoid walking on wet floors.  Keep items that you use a lot in easy-to-reach places.  If you need to reach something above you, use a strong step stool that has a grab bar.  Keep electrical cords out of the way.  Do not use floor polish or wax that makes floors slippery. If you must use wax, use non-skid floor wax.  Do not have throw rugs and other things on the floor that can make you trip. What can I do with my stairs?  Do not leave any items on the stairs.  Make sure that there are handrails on both sides of the stairs and use them. Fix handrails that are broken or loose. Make  sure that handrails are as long as the stairways.  Check any carpeting to make sure that it is firmly attached to the stairs. Fix any carpet that is loose or worn.  Avoid having throw rugs at the top or bottom of the stairs. If you do have throw rugs, attach them to the floor with carpet tape.  Make sure that you have a light switch at the top of the stairs and the bottom of the stairs. If you do not have them, ask someone to add them for you. What else can I do to help prevent falls?  Wear shoes that:  Do not have high heels.  Have rubber bottoms.  Are comfortable and fit you well.  Are closed at the toe. Do not wear sandals.  If you use a stepladder:  Make sure that it is fully opened. Do not climb a closed stepladder.  Make sure that both sides of the stepladder are locked into place.  Ask someone to hold it for you, if possible.  Clearly mark and make sure that you can see:  Any grab bars or handrails.  First and last steps.  Where the edge of each step is.  Use tools that help you move around (mobility aids) if they are needed. These include:  Canes.  Walkers.  Scooters.  Crutches.  Turn on the lights when you go into a dark area. Replace any light bulbs as soon as they burn out.  Set up your furniture so you have a clear path. Avoid moving your furniture around.  If any of your floors are uneven, fix them.  If there are any pets around you, be aware of where they are.  Review your medicines with your doctor. Some medicines can make you feel dizzy. This can increase your chance of falling. Ask your doctor what other things that you can do to help prevent falls. This information is not intended to replace advice given to you by your health care provider. Make sure you discuss any questions you have with your health care provider. Document Released: 09/11/2009 Document Revised: 04/22/2016 Document Reviewed: 12/20/2014 Elsevier Interactive Patient Education   2017 [employer].

## 2020-06-10 NOTE — Progress Notes (Addendum)
Subjective:   Teresa Wade is a 86 y.o. female who presents for Medicare Annual (Subsequent) preventive examination.  Review of Systems    No ROS.  Medicare Wellness Virtual Visit.   Cardiac Risk Factors include: advanced age (>37men, >22 women);hypertension;diabetes mellitus     Objective:    Today's Vitals   06/10/20 0834  Weight: 136 lb (61.7 kg)  Height: 5' (1.524 m)   Body mass index is 26.56 kg/m.  Advanced Directives 06/10/2020 06/08/2019 06/06/2018 05/05/2018 04/18/2017 12/31/2016 12/23/2016  Does Patient Have a Medical Advance Directive? Yes Yes Yes Yes Yes Yes No  Type of Paramedic of [place];Living will Healthcare Power of [place];Living will Healthcare Power of [place] of [place] -  Does patient want to make changes to medical advance directive? No - Patient declined No - Patient declined No - Patient declined - No - Patient declined - -  Copy of [place] in Chart? No - copy requested No - copy requested No - copy requested No - copy requested No - copy requested - -    Current Medications (verified) Outpatient Encounter Medications as of 06/10/2020  Medication Sig   acebutolol (SECTRAL) 200 MG capsule TAKE 2 CAPSULES BY MOUTH EVERY MORNING & 1 CAPSULE EVERY EVENING   amLODipine (NORVASC) 5 MG tablet TAKE 1 TABLET BY MOUTH EVERY DAY   aspirin 81 MG tablet Take 81 mg by mouth daily.   calcium carbonate (OS-CAL) 1250 MG chewable tablet Chew 1 tablet by mouth 2 (two) times daily.   calcium elemental as carbonate (TUMS ULTRA 1000) 400 MG chewable tablet Chew by mouth.   clobetasol ointment (TEMOVATE) 0.93 % Apply 1 application topically 2 (two) times daily.   esomeprazole (NEXIUM) 40 MG capsule TAKE ONE CAPSULE BY Mouth daily.   ipratropium (ATROVENT) 0.03 % nasal spray INSTILL 1 TO 2 SPRAYS INTO EACH NOSTRIL UP TO 3 TIMES A DAY FOR DRAINAGE    losartan (COZAAR) 100 MG tablet TAKE 1 TABLET BY MOUTH EVERY DAY   magnesium oxide (MAG-OX) 400 MG tablet Take 400 mg by mouth daily.   nitroGLYCERIN (NITROSTAT) 0.4 MG SL tablet Place 1 tablet (0.4 mg total) under the tongue every 5 (five) minutes as needed for chest pain. May repeat x 1.  If persistent pain, call 911   prochlorperazine (COMPAZINE) 10 MG tablet TAKE 1 TABLET (10 MG TOTAL) BY MOUTH EVERY 6 (SIX) HOURS AS NEEDED FOR NAUSEA OR VOMITING   simvastatin (ZOCOR) 40 MG tablet TAKE 1 TABLET BY MOUTH EVERYDAY AT BEDTIME   TACLONEX external suspension    triamcinolone cream (KENALOG) 0.1 % Apply 1 application topically 2 (two) times daily.   No facility-administered encounter medications on file as of 06/10/2020.    Allergies (verified) Amoxicillin-pot clavulanate, Clarithromycin, Lodine [etodolac], Minocycline, Naproxen sodium, Rofecoxib, Vibramycin [doxycycline calcium], and Mobic [meloxicam]   History: Past Medical History:  Diagnosis Date   Abnormal chest CT    right hilar cyst   Anxiety    Fibrocystic disease of breast    GERD (gastroesophageal reflux disease)    History of thrombocytopenia    Hypercholesterolemia    Hyperglycemia    Hypertension    Osteoarthritis    Osteoporosis    s/p Actonel, Reclast (last 2011)   Palpitations    Pre-diabetes    Psoriasis    Reflux esophagitis    Past Surgical History:  Procedure Laterality Date   ABDOMINAL  HYSTERECTOMY  1980   BREAST EXCISIONAL BIOPSY Left 1976   benign   COLONOSCOPY WITH PROPOFOL N/A 05/05/2018   Procedure: COLONOSCOPY WITH PROPOFOL;  Surgeon: Manya Silvas, MD;  Location[facility] ENDOSCOPY;  Service: Endoscopy;  Laterality: N/A;   ESOPHAGOGASTRODUODENOSCOPY (EGD) WITH PROPOFOL N/A 05/05/2018   Procedure: ESOPHAGOGASTRODUODENOSCOPY (EGD) WITH PROPOFOL;  Surgeon: Manya Silvas, MD;  Location: [facility] END[facility]COPY;  Service: Endoscopy;  Laterality: N/A;   THUMB AMPUTATION Left 2015    Family History  Problem Relation Age of Onset   Heart disease Mother    Hypertension Mother    Mental illness Other        sibling, suicide   Colon cancer Sister    Sleep apnea Brother    Breast cancer Neg Hx    Social History   Socioeconomic History   Marital status: Widowed    Spouse name: Not on file   Number of children: 1   Years of education: Not on file   Highest education level: Not on file  Occupational History   Not on file  Tobacco Use   Smoking status: Former Smoker    Quit date: 1999    Years since quitting: 22.5   Smokeless tobacco: Never Used  Scientific laboratory technician Use: Never used  Substance and Sexual Activity   Alcohol use: No    Alcohol/week: 0.0 standard drinks   Drug use: No   Sexual activity: Never  Other Topics Concern   Not on file  Social History Narrative   Not on file   Social Determinants of Health   Financial Resource Strain:    Difficulty of Paying Living Expenses:   Food Insecurity:    Worried About Charity fundraiser in the Last Year:    Arboriculturist in the Last Year:   Transportation Needs:    Film/video editor (Medical):    Lack of Transportation (Non-Medical):   Physical Activity:    Days of Exercise per Week:    Minutes of Exercise per Session:   Stress:    Feeling of Stress :   Social Connections:    Frequency of Communication with Friends and Family:    Frequency of Social Gatherings with Friends and Family:    Attends Religious Services:    Active Member of Clubs or Organizations:    Attends Music therapist:    Marital Status:     Tobacco Counseling Counseling given: Not Answered   Clinical Intake:  Pre-visit preparation completed: Yes        Diabetes: Yes (Followed by pcp)  How often do you need to have someone help you when you read instructions, pamphlets, or other written materials from your doctor or pharmacy?: 1 - Never  Interpreter  Needed?: No      Activities of Daily Living In your present state of health, do you have any difficulty performing the following activities: 06/10/2020  Hearing? Y  Comment Hearing aids  Vision? N  Difficulty concentrating or making decisions? N  Walking or climbing stairs? N  Dressing or bathing? N  Doing errands, shopping? N  Preparing Food and eating ? N  Using the Toilet? N  In the past six months, have you accidently leaked urine? N  Do you have problems with loss of bowel control? N  Managing your Medications? N  Managing your Finances? N  Housekeeping or managing your Housekeeping? N  Some recent data might be hidden    Patient  Care Team: Einar Pheasant, MD as PCP - General (Internal Medicine)  Indicate any recent Medical Services you may have received from other than [facility] providers in the past year (date may be approximate).     Assessment:   This is a routine wellness examination for Enslie.  I connected with Kyesha today by telephone and verified that I am speaking with the correct person using two identifiers. Location patient: home Location provider: work Persons participating in the virtual visit: patient, Marine scientist.    I discussed the limitations, risks, security and privacy concerns of performing an evaluation and management service by telephone and the availability of in person appointments. The patient expressed understanding and verbally consented to this telephonic visit.    Interactive audio and video telecommunications were attempted between this provider and patient, however failed, due to patient having technical difficulties OR patient did not have access to video capability.  We continued and completed visit with audio only.  Some vital signs may be absent or patient reported.   Hearing/Vision screen  Hearing Screening   125Hz  250Hz  500Hz  1000Hz  2000Hz  3000Hz  4000Hz  6000Hz  8000Hz   Right ear:           Left ear:           Comments: Followed by Carita Pian Visits every 4 months  Hearing aid, bilateral   Vision Screening Comments: Followed by [facility], Dr. George Ina Wears corrective lenses Cataract extraction, bilateral Visual acuity not assessed, virtual visit.  They have seen their ophthalmologist in the last 12 months.     Dietary issues and exercise activities discussed: Current Exercise Habits: The patient does not participate in regular exercise at present;Home exercise routine, Intensity: Mild  Modified carb diet Good water intake  Goals      Patient Stated     Follow up with Primary Care Provider (pt-stated)      Keep all routine maintenance appointments.       Depression Screen PHQ 2/9 Scores 06/10/2020 06/08/2019 06/06/2018 04/18/2017 01/12/2017 12/05/2015 11/26/2014  PHQ - 2 Score 0 0 0 0 0 0 0  PHQ- 9 Score - - - 0 - - -    Fall Risk Fall Risk  06/10/2020 12/21/2019 10/29/2019 06/08/2019 06/06/2018  Falls in the past year? 0 0 0 0 No  Number falls in past yr: 0 - - - -  Follow up Falls evaluation completed Falls evaluation completed - - -    Handrails in use when climbing stairs? Yes  Home free of loose throw rugs in walkways, pet beds, electrical cords, etc? Yes  Adequate lighting in your home to reduce risk of falls? Yes   ASSISTIVE DEVICES UTILIZED TO PREVENT FALLS:  Life alert? No  Use of a cane, walker or w/c? No  Grab bars in the bathroom? Yes  Shower chair or bench in shower? Yes  Elevated toilet seat or a handicapped toilet? No   TIMED UP AND GO:  Was the test performed? No .    Cognitive Function:     6CIT Screen 06/10/2020 06/08/2019 06/06/2018 04/18/2017  What Year? 0 points 0 points 0 points 0 points  What month? 0 points 0 points 0 points 0 points  What time? - 0 points 0 points 0 points  Count back from 20 - 0 points 0 points 0 points  Months in reverse 0 points 0 points 0 points 0 points  Repeat phrase 0 points 0 points 0 points 0 points  Total Score - 0  0 0     Immunizations Immunization History  Administered Date(s) Administered   Fluad Quad(high Dose 65+) 08/08/2019   Influenza Split 09/10/2013   Influenza, High Dose Seasonal PF 09/04/2018   Influenza,inj,Quad PF,6+ Mos 07/25/2014   Influenza-Unspecified 08/16/2012, 08/22/2013, 07/25/2014, 08/30/2015, 08/25/2016, 08/30/2017   PFIZER SARS-COV-2 Vaccination 12/04/2019, 12/25/2019   Pneumococcal Conjugate-13 03/19/2014   Pneumococcal Polysaccharide-23 05/31/2017   Td 04/11/2013   Health Maintenance Health Maintenance  Topic Date Due   FOOT EXAM  09/02/2018   HEMOGLOBIN A1C  06/17/2020   INFLUENZA VACCINE  06/29/2020   MAMMOGRAM  10/01/2020   OPHTHALMOLOGY EXAM  01/23/2021   TETANUS/TDAP  04/12/2023   DEXA SCAN  Completed   COVID-19 Vaccine  Completed   PNA vac Low Risk Adult  Completed   Foot exam- denies wounds or changes. Followed by pcp.   Dental Screening: Recommended annual dental exams for proper oral hygiene  Community Resource Referral / Chronic Care Management: CRR required this visit?  No   CCM required this visit?  No      Plan:   Keep all routine maintenance appointments.   Follow up 08/08/20 @ 11:00  I have personally reviewed and noted the following in the patients chart:    Medical and social history  Use of alcohol, tobacco or illicit drugs   Current medications and supplements  Functional ability and status  Nutritional status  Physical activity  Advanced directives  List of other physicians  Hospitalizations, surgeries, and ER visits in previous 12 months  Vitals  Screenings to include cognitive, depression, and falls  Referrals and appointments  In addition, I have reviewed and discussed with patient certain preventive protocols, quality metrics, and best practice recommendations. A written personalized care plan for preventive services as well as general preventive health recommendations were provided to patient  via mail.     Varney Biles, LPN   02/20/4981    Reviewed above information.  Agree with assessment and plan.    Dr Nicki Reaper

## 2020-06-18 ENCOUNTER — Other Ambulatory Visit: Payer: Self-pay | Admitting: Internal Medicine

## 2020-07-03 ENCOUNTER — Other Ambulatory Visit: Payer: Self-pay | Admitting: Internal Medicine

## 2020-08-08 ENCOUNTER — Ambulatory Visit: Payer: Medicare Other | Admitting: Internal Medicine

## 2020-08-08 ENCOUNTER — Other Ambulatory Visit: Payer: Self-pay

## 2020-08-08 VITALS — BP 122/68 | HR 65 | Temp 98.2°F | Resp 16 | Ht 60.0 in | Wt 129.4 lb

## 2020-08-08 DIAGNOSIS — E119 Type 2 diabetes mellitus without complications: Secondary | ICD-10-CM | POA: Diagnosis not present

## 2020-08-08 DIAGNOSIS — I10 Essential (primary) hypertension: Secondary | ICD-10-CM | POA: Diagnosis not present

## 2020-08-08 DIAGNOSIS — R109 Unspecified abdominal pain: Secondary | ICD-10-CM

## 2020-08-08 DIAGNOSIS — Z86010 Personal history of colonic polyps: Secondary | ICD-10-CM

## 2020-08-08 DIAGNOSIS — R5383 Other fatigue: Secondary | ICD-10-CM | POA: Diagnosis not present

## 2020-08-08 DIAGNOSIS — R739 Hyperglycemia, unspecified: Secondary | ICD-10-CM | POA: Diagnosis not present

## 2020-08-08 DIAGNOSIS — K52832 Lymphocytic colitis: Secondary | ICD-10-CM

## 2020-08-08 DIAGNOSIS — Z23 Encounter for immunization: Secondary | ICD-10-CM

## 2020-08-08 DIAGNOSIS — E7800 Pure hypercholesterolemia, unspecified: Secondary | ICD-10-CM

## 2020-08-08 DIAGNOSIS — K219 Gastro-esophageal reflux disease without esophagitis: Secondary | ICD-10-CM

## 2020-08-08 DIAGNOSIS — R110 Nausea: Secondary | ICD-10-CM

## 2020-08-08 LAB — LIPID PANEL
Cholesterol: 128 mg/dL (ref 0–200)
HDL: 45.9 mg/dL (ref >39.00)
LDL Cholesterol: 53 mg/dL (ref 0–99)
NonHDL: 82.07
Total CHOL/HDL Ratio: 3
Triglycerides: 146 mg/dL (ref 0.0–149.0)
VLDL: 29.2 mg/dL (ref 0.0–40.0)

## 2020-08-08 LAB — BASIC METABOLIC PANEL
BUN: 22 mg/dL (ref 6–23)
CO2: 32 mEq/L (ref 19–32)
Calcium: 9.7 mg/dL (ref 8.4–10.5)
Chloride: 100 mEq/L (ref 96–112)
Creatinine, Ser: 1.14 mg/dL (ref 0.40–1.20)
GFR: 45.13 mL/min — ABNORMAL LOW (ref >60.00)
Glucose, Bld: 100 mg/dL — ABNORMAL HIGH (ref 70–99)
Potassium: 4.6 mEq/L (ref 3.5–5.1)
Sodium: 138 mEq/L (ref 135–145)

## 2020-08-08 LAB — CBC WITH DIFFERENTIAL/PLATELET
Basophils Absolute: 0 10*3/uL (ref 0.0–0.1)
Basophils Relative: 0.7 % (ref 0.0–3.0)
Eosinophils Absolute: 0 10*3/uL (ref 0.0–0.7)
Eosinophils Relative: 0.4 % (ref 0.0–5.0)
HCT: 37.5 % (ref 36.0–46.0)
Hemoglobin: 12.6 g/dL (ref 12.0–15.0)
Lymphocytes Relative: 34.1 % (ref 12.0–46.0)
Lymphs Abs: 2.1 10*3/uL (ref 0.7–4.0)
MCHC: 33.6 g/dL (ref 30.0–36.0)
MCV: 92.1 fl (ref 78.0–100.0)
Monocytes Absolute: 0.6 10*3/uL (ref 0.1–1.0)
Monocytes Relative: 10.4 % (ref 3.0–12.0)
Neutro Abs: 3.4 10*3/uL (ref 1.4–7.7)
Neutrophils Relative %: 54.4 % (ref 43.0–77.0)
Platelets: 232 10*3/uL (ref 150.0–400.0)
RBC: 4.07 Mil/uL (ref 3.87–5.11)
RDW: 13.1 % (ref 11.5–15.5)
WBC: 6.2 10*3/uL (ref 4.0–10.5)

## 2020-08-08 LAB — HEPATIC FUNCTION PANEL
ALT: 12 U/L (ref 0–35)
AST: 18 U/L (ref 0–37)
Albumin: 4.2 g/dL (ref 3.5–5.2)
Alkaline Phosphatase: 78 U/L (ref 39–117)
Bilirubin, Direct: 0.1 mg/dL (ref 0.0–0.3)
Total Bilirubin: 0.7 mg/dL (ref 0.2–1.2)
Total Protein: 6.7 g/dL (ref 6.0–8.3)

## 2020-08-08 LAB — VITAMIN B12: Vitamin B-12: 280 pg/mL (ref 211–911)

## 2020-08-08 LAB — HEMOGLOBIN A1C: Hgb A1c MFr Bld: 6.2 % (ref 4.6–6.5)

## 2020-08-08 LAB — TSH: TSH: 1.03 u[IU]/mL (ref 0.35–4.50)

## 2020-08-08 NOTE — Progress Notes (Signed)
Patient ID: Teresa Wade, female   DOB: 1932-04-09, 86 y.o.   MRN: [id number]   Subjective:    Patient ID: Teresa Wade, female    DOB: 19-Jan-1932, 86 y.o.   MRN: [id number]  HPI This visit occurred during the SARS-CoV-2 public health emergency.  Safety protocols were in place, including screening questions prior to the visit, additional usage of staff PPE, and extensive cleaning of exam room while observing appropriate contact time as indicated for disinfecting solutions.  Patient here for a scheduled follow up.  Saw cardiology 02/2020.  Felt stable.  Previous functional study - no reversible ischemia.  EF 74%.  Felt stable.  Recommended f/u in 6 months.  She is having increased GI issues recently.  Has flares intermittently.  Has worsened over the last 2 months.  Reports intermittent vomiting, dry heaves and nausea.  Has seen GI. Extensive GI work up.  EGD 05/05/18 - gastritis.  Ultrasound and HIDA scan - negative for evidence of gallbladder disease.  CT - scattered colonic and small bowel diverticula with small bowel diverticula more prominent compared to previous CT. S/p treatment for SIBO.  Celiac testing negative.  Pathology from colonoscopy suggests lymphocytic colitis.  Has been on budesonide.  Takes PPI.  She is trying to watch her diet.  No known triggers.  States mylanta helps most.  No chest pain.  Breathing stable.  Occasional diarrhea.    Past Medical History:  Diagnosis Date  . Abnormal chest CT    right hilar cyst  . Anxiety   . Fibrocystic disease of breast   . GERD (gastroesophageal reflux disease)   . History of thrombocytopenia   . Hypercholesterolemia   . Hyperglycemia   . Hypertension   . Osteoarthritis   . Osteoporosis    s/p Actonel, Reclast (last 2011)  . Palpitations   . Pre-diabetes   . Psoriasis   . Reflux esophagitis    Past Surgical History:  Procedure Laterality Date  . ABDOMINAL HYSTERECTOMY  1980  . BREAST EXCISIONAL BIOPSY Left 1976   benign  .  COLONOSCOPY WITH PROPOFOL N/A 05/05/2018   Procedure: COLONOSCOPY WITH PROPOFOL;  Surgeon: Manya Silvas, MD;  Location: [facility] ENDOSCOPY;  Service: Endoscopy;  Laterality: N/A;  . ESOPHAGOGASTRODUODENOSCOPY (EGD) WITH PROPOFOL N/A 05/05/2018   Procedure: ESOPHAGOGASTRODUODENOSCOPY (EGD) WITH PROPOFOL;  Surgeon: Manya Silvas, MD;  Location[facility] ENDOSCOPY;  Service: Endoscopy;  Laterality: N/A;  . THUMB AMPUTATION Left 2015   Family History  Problem Relation Age of Onset  . Heart disease Mother   . Hypertension Mother   . Mental illness Other        sibling, suicide  . Colon cancer Sister   . Sleep apnea Brother   . Breast cancer Neg Hx    Social History   Socioeconomic History  . Marital status: Widowed    Spouse name: Not on file  . Number of children: 1  . Years of education: Not on file  . Highest education level: Not on file  Occupational History  . Not on file  Tobacco Use  . Smoking status: Former Smoker    Quit date: 1999    Years since quitting: 22.7  . Smokeless tobacco: Never Used  Vaping Use  . Vaping Use: Never used  Substance and Sexual Activity  . Alcohol use: No    Alcohol/week: 0.0 standard drinks  . Drug use: No  . Sexual activity: Never  Other Topics Concern  . Not on file  Social History Narrative  . Not on file   Social Determinants of Health   Financial Resource Strain:   . Difficulty of Paying Living Expenses: Not on file  Food Insecurity:   . Worried About Charity fundraiser in the Last Year: Not on file  . Ran Out of Food in the Last Year: Not on file  Transportation Needs:   . Lack of Transportation (Medical): Not on file  . Lack of Transportation (Non-Medical): Not on file  Physical Activity:   . Days of Exercise per Week: Not on file  . Minutes of Exercise per Session: Not on file  Stress:   . Feeling of Stress : Not on file  Social Connections:   . Frequency of Communication with Friends and Family: Not on file  . Frequency  of Social Gatherings with Friends and Family: Not on file  . Attends Religious Services: Not on file  . Active Member of Clubs or Organizations: Not on file  . Attends Archivist Meetings: Not on file  . Marital Status: Not on file    Outpatient Encounter Medications as of 08/08/2020  Medication Sig  . acebutolol (SECTRAL) 200 MG capsule TAKE 2 CAPSULES BY MOUTH EVERY MORNING & 1 CAPSULE EVERY EVENING  . amLODipine (NORVASC) 5 MG tablet TAKE 1 TABLET BY MOUTH EVERY DAY  . aspirin 81 MG tablet Take 81 mg by mouth daily.  . calcium carbonate (OS-CAL) 1250 MG chewable tablet Chew 1 tablet by mouth 2 (two) times daily.  . calcium elemental as carbonate (TUMS ULTRA 1000) 400 MG chewable tablet Chew by mouth.  . clobetasol ointment (TEMOVATE) 2.58 % Apply 1 application topically 2 (two) times daily.  Marland Kitchen esomeprazole (NEXIUM) 40 MG capsule TAKE ONE CAPSULE BY Mouth daily.  Marland Kitchen losartan (COZAAR) 100 MG tablet TAKE 1 TABLET BY MOUTH EVERY DAY  . magnesium oxide (MAG-OX) 400 MG tablet Take 400 mg by mouth daily.  . nitroGLYCERIN (NITROSTAT) 0.4 MG SL tablet Place 1 tablet (0.4 mg total) under the tongue every 5 (five) minutes as needed for chest pain. May repeat x 1.  If persistent pain, call 911  . simvastatin (ZOCOR) 40 MG tablet TAKE 1 TABLET BY MOUTH EVERYDAY AT BEDTIME  . triamcinolone cream (KENALOG) 0.1 % Apply 1 application topically 2 (two) times daily.  . [DISCONTINUED] ipratropium (ATROVENT) 0.03 % nasal spray INSTILL 1 TO 2 SPRAYS INTO EACH NOSTRIL UP TO 3 TIMES A DAY FOR DRAINAGE  . [DISCONTINUED] prochlorperazine (COMPAZINE) 10 MG tablet TAKE 1 TABLET (10 MG TOTAL) BY MOUTH EVERY 6 (SIX) HOURS AS NEEDED FOR NAUSEA OR VOMITING  . [DISCONTINUED] TACLONEX external suspension    No facility-administered encounter medications on file as of 08/08/2020.    Review of Systems  Constitutional:       Has lost weight.  Decreased appetite.   HENT: Negative for congestion and sinus  pressure.   Respiratory: Negative for cough, chest tightness and shortness of breath.   Cardiovascular: Negative for chest pain, palpitations and leg swelling.  Gastrointestinal: Positive for abdominal pain.       Occasional diarrhea.  Intermittent nausea and vomiting.    Genitourinary: Negative for difficulty urinating and dysuria.  Musculoskeletal: Negative for joint swelling and myalgias.  Skin: Negative for color change and rash.  Neurological: Negative for dizziness, light-headedness and headaches.  Psychiatric/Behavioral: Negative for agitation and dysphoric mood.       Objective:    Physical Exam Vitals reviewed.  Constitutional:  General: She is not in acute distress.    Appearance: Normal appearance.  HENT:     Head: Normocephalic and atraumatic.     Right Ear: External ear normal.     Left Ear: External ear normal.  Eyes:     General: No scleral icterus.       Right eye: No discharge.        Left eye: No discharge.     Conjunctiva/sclera: Conjunctivae normal.  Neck:     Thyroid: No thyromegaly.  Cardiovascular:     Rate and Rhythm: Normal rate and regular rhythm.  Pulmonary:     Effort: No respiratory distress.     Breath sounds: Normal breath sounds. No wheezing.  Abdominal:     General: Bowel sounds are normal.     Palpations: Abdomen is soft.     Tenderness: There is no abdominal tenderness.  Musculoskeletal:        General: No swelling or tenderness.     Cervical back: Neck supple. No tenderness.  Lymphadenopathy:     Cervical: No cervical adenopathy.  Skin:    Findings: No erythema or rash.  Neurological:     Mental Status: She is alert.  Psychiatric:        Mood and Affect: Mood normal.        Behavior: Behavior normal.     BP 122/68   Pulse 65   Temp 98.2 F (36.8 C) (Oral)   Resp 16   Ht 5' (1.524 m)   Wt 129 lb 6.4 oz (58.7 kg)   LMP 11/09/1979   SpO2 99%   BMI 25.27 kg/m  Wt Readings from Last 3 Encounters:  08/08/20 129 lb  6.4 oz (58.7 kg)  06/10/20 136 lb (61.7 kg)  12/21/19 136 lb (61.7 kg)     Lab Results  Component Value Date   WBC 6.2 08/08/2020   HGB 12.6 08/08/2020   HCT 37.5 08/08/2020   PLT 232.0 08/08/2020   GLUCOSE 100 (H) 08/08/2020   CHOL 128 08/08/2020   TRIG 146.0 08/08/2020   HDL 45.90 08/08/2020   LDLDIRECT 75.0 01/06/2018   LDLCALC 53 08/08/2020   ALT 12 08/08/2020   AST 18 08/08/2020   NA 138 08/08/2020   K 4.6 08/08/2020   CL 100 08/08/2020   CREATININE 1.14 08/08/2020   BUN 22 08/08/2020   CO2 32 08/08/2020   TSH 1.03 08/08/2020   HGBA1C 6.2 08/08/2020   MICROALBUR <0.7 08/08/2019    MM 3D SCREEN BREAST BILATERAL  Result Date: 10/03/2019 CLINICAL DATA:  Screening. EXAM: DIGITAL SCREENING BILATERAL MAMMOGRAM WITH TOMO AND CAD COMPARISON:  Previous exam(s). ACR Breast Density Category b: There are scattered areas of fibroglandular density. FINDINGS: There are no findings suspicious for malignancy. Images were processed with CAD. IMPRESSION: No mammographic evidence of malignancy. A result letter of this screening mammogram will be mailed directly to the patient. RECOMMENDATION: Screening mammogram in one year. (Code:SM-B-01Y) BI-RADS CATEGORY  1: Negative. Electronically Signed   By: Lillia Mountain M.D.   On: 10/03/2019 08:59       Assessment & Plan:   Problem List Items Addressed This Visit    Nausea    Persistent intermittent issue.  Has seen GI.  Extensive GI w/up as outlined.  She request second opinion.  Continues PPI.       Relevant Orders   Ambulatory referral to Gastroenterology   Lymphocytic colitis    Worked up by GI as outlined. Previously on Budesonide.  Off now.  Request second opinion.       Relevant Orders   Ambulatory referral to Gastroenterology   Hyperglycemia - Primary    Low carb diet and exercise.  Follow met b and a1c.        Hypercholesterolemia    On simvastatin.  Low cholesterol diet and exercise.  Follow lipid panel and liver function  tests.        Relevant Orders   Hepatic function panel (Completed)   Lipid panel (Completed)   History of colonic polyps    Colonoscopy 04/2018 - pathology - mild active colitis.       GERD (gastroesophageal reflux disease)    On nexium.        Fatigue   Relevant Orders   Vitamin B12 (Completed)   Essential hypertension    Continues on amlodipine and losartan.  Follow pressures.  Follow metabolic panel.       Relevant Orders   CBC with Differential/Platelet (Completed)   TSH (Completed)   Diabetes ([place])    Low carb diet and exercise.  Follow met b and a1c.       Relevant Orders   Hemoglobin A1c (Completed)   Basic metabolic panel (Completed)   Abdominal pain    Continued intermittent abdominal pain, nausea and vomiting.  Extensive w/up as outlined.  Continues on nexium.  Off budesonide.  Request second opinion. Continues to monitor diet, etc.       Relevant Orders   Ambulatory referral to Gastroenterology    Other Visit Diagnoses    Need for immunization against influenza       Relevant Orders   Flu Vaccine QUAD High Dose(Fluad) (Completed)       Einar Pheasant, MD

## 2020-08-09 ENCOUNTER — Other Ambulatory Visit: Payer: Self-pay | Admitting: Internal Medicine

## 2020-08-09 DIAGNOSIS — R944 Abnormal results of kidney function studies: Secondary | ICD-10-CM

## 2020-08-09 NOTE — Progress Notes (Signed)
Orders placed for f/u labs.  

## 2020-08-10 ENCOUNTER — Encounter: Payer: Self-pay | Admitting: Internal Medicine

## 2020-08-10 NOTE — Assessment & Plan Note (Signed)
Low carb diet and exercise.  Follow met b and a1c.  

## 2020-08-10 NOTE — Assessment & Plan Note (Signed)
Continued intermittent abdominal pain, nausea and vomiting.  Extensive w/up as outlined.  Continues on nexium.  Off budesonide.  Request second opinion. Continues to monitor diet, etc.

## 2020-08-10 NOTE — Assessment & Plan Note (Signed)
Persistent intermittent issue.  Has seen GI.  Extensive GI w/up as outlined.  She request second opinion.  Continues PPI.

## 2020-08-10 NOTE — Assessment & Plan Note (Signed)
Worked up by GI as outlined. Previously on Budesonide.  Off now.  Request second opinion.

## 2020-08-10 NOTE — Assessment & Plan Note (Signed)
On simvastatin.  Low cholesterol diet and exercise.  Follow lipid panel and liver function tests.   

## 2020-08-10 NOTE — Assessment & Plan Note (Signed)
On nexium

## 2020-08-10 NOTE — Assessment & Plan Note (Signed)
Continues on amlodipine and losartan. Follow pressures.  Follow metabolic panel.  

## 2020-08-10 NOTE — Assessment & Plan Note (Signed)
Colonoscopy 04/2018 - pathology - mild active colitis.

## 2020-08-10 NOTE — Assessment & Plan Note (Signed)
Low carb diet and exercise.  Follow met b and a1c.   

## 2020-08-26 ENCOUNTER — Other Ambulatory Visit: Payer: Self-pay | Admitting: Internal Medicine

## 2020-08-26 DIAGNOSIS — Z1231 Encounter for screening mammogram for malignant neoplasm of breast: Secondary | ICD-10-CM

## 2020-08-27 ENCOUNTER — Other Ambulatory Visit: Payer: Self-pay

## 2020-08-27 ENCOUNTER — Other Ambulatory Visit (INDEPENDENT_AMBULATORY_CARE_PROVIDER_SITE_OTHER): Payer: Medicare Other

## 2020-08-27 DIAGNOSIS — R944 Abnormal results of kidney function studies: Secondary | ICD-10-CM

## 2020-08-27 LAB — BASIC METABOLIC PANEL
BUN: 15 mg/dL (ref 6–23)
CO2: 31 mEq/L (ref 19–32)
Calcium: 9.2 mg/dL (ref 8.4–10.5)
Chloride: 103 mEq/L (ref 96–112)
Creatinine, Ser: 0.89 mg/dL (ref 0.40–1.20)
GFR: 60.05 mL/min (ref >60.00)
Glucose, Bld: 97 mg/dL (ref 70–99)
Potassium: 4.6 mEq/L (ref 3.5–5.1)
Sodium: 139 mEq/L (ref 135–145)

## 2020-08-27 LAB — URINALYSIS, ROUTINE W REFLEX MICROSCOPIC
Bilirubin Urine: NEGATIVE
Ketones, ur: NEGATIVE
Leukocytes,Ua: NEGATIVE
Nitrite: NEGATIVE
Specific Gravity, Urine: 1.01 (ref 1.000–1.030)
Total Protein, Urine: NEGATIVE
Urine Glucose: NEGATIVE
Urobilinogen, UA: 0.2 (ref 0.0–1.0)
pH: 7 (ref 5.0–8.0)

## 2020-08-27 LAB — MICROALBUMIN / CREATININE URINE RATIO
Creatinine,U: 65.7 mg/dL
Microalb Creat Ratio: 1.1 mg/g (ref 0.0–30.0)
Microalb, Ur: <0.7 mg/dL (ref 0.0–1.9)

## 2020-09-15 ENCOUNTER — Other Ambulatory Visit: Payer: Self-pay | Admitting: Internal Medicine

## 2020-10-02 ENCOUNTER — Other Ambulatory Visit: Payer: Self-pay

## 2020-10-02 ENCOUNTER — Ambulatory Visit
Admission: RE | Admit: 2020-10-02 | Discharge: 2020-10-02 | Disposition: A | Discharge status: Home / Self Care | Payer: Medicare Other | Source: Ambulatory Visit | Attending: Internal Medicine | Admitting: Internal Medicine

## 2020-10-02 DIAGNOSIS — Z1231 Encounter for screening mammogram for malignant neoplasm of breast: Secondary | ICD-10-CM | POA: Diagnosis not present

## 2020-10-16 ENCOUNTER — Ambulatory Visit (INDEPENDENT_AMBULATORY_CARE_PROVIDER_SITE_OTHER): Payer: Medicare Other | Admitting: Internal Medicine

## 2020-10-16 ENCOUNTER — Other Ambulatory Visit: Payer: Self-pay

## 2020-10-16 VITALS — BP 122/68 | HR 57 | Temp 98.0°F | Resp 16 | Ht 60.0 in | Wt 126.6 lb

## 2020-10-16 DIAGNOSIS — I10 Essential (primary) hypertension: Secondary | ICD-10-CM

## 2020-10-16 DIAGNOSIS — E1165 Type 2 diabetes mellitus with hyperglycemia: Secondary | ICD-10-CM

## 2020-10-16 DIAGNOSIS — R110 Nausea: Secondary | ICD-10-CM

## 2020-10-16 DIAGNOSIS — R109 Unspecified abdominal pain: Secondary | ICD-10-CM | POA: Diagnosis not present

## 2020-10-16 DIAGNOSIS — E7800 Pure hypercholesterolemia, unspecified: Secondary | ICD-10-CM

## 2020-10-16 DIAGNOSIS — K219 Gastro-esophageal reflux disease without esophagitis: Secondary | ICD-10-CM

## 2020-10-16 DIAGNOSIS — Z0000 Encounter for general adult medical examination without abnormal findings: Secondary | ICD-10-CM | POA: Diagnosis not present

## 2020-10-16 DIAGNOSIS — K52832 Lymphocytic colitis: Secondary | ICD-10-CM

## 2020-10-16 NOTE — Progress Notes (Signed)
Patient ID: Teresa Wade, female   DOB: [birth year], 86 y.o.   MRN: [id number]   Subjective:    Patient ID: Teresa Wade, female    DOB: 1932-10-21, 86 y.o.   MRN: [id number]  HPI This visit occurred during the SARS-CoV-2 public health emergency.  Safety protocols were in place, including screening questions prior to the visit, additional usage of staff PPE, and extensive cleaning of exam room while observing appropriate contact time as indicated for disinfecting solutions.  Patient here for her physical.  She continues to have intermittent GI flares - vomiting, dry heaves and nausea.  Has seen GI.  Extensive GI w/up.  EGD 04/2018 - gastritis.  Ultrasound and HIDA scan - negative for evidence of gallbladder disease.  CT - scattered colonic and small bowel diverticula with small bowel diverticula more prominent compared to previous cT.  S/p treatment for sIBO.  Celiac testing negative.  Pathology from colonoscopy suggests lymphocytic colitis.  Has been on budesonide previously.  Takes PPI.  Has continued to lose weight.  Takes prn mylanta.  Ate out yesterday - vegetable plate  - few hours later - positive emesis of undigested food.  She has adjusted her diet, limiting a lot of foods.  She is eating eggs, applesauce.  No beef.  Request referral to [facility] to help with diet.  She has been followed at [facility].  Last visit, requested to have a second opinion.  She has an appt with Dr Tobi Bastos in December.  She continues to have palpitations. Notices more at night. Sees Dr Lady Gary.  Holter monitor reveled sinus rhythm with rare PVCs and PACs.  Functional study in the recent past which revealed no significant ischemia.  EF 74%.  Recommended to avoid stimulants.  Breathing overall stable.  No increased cough or congestion.  Blood pressure doing well.    Past Medical History:  Diagnosis Date  . Abnormal chest CT    right hilar cyst  . Anxiety   . Fibrocystic disease of breast   . GERD  (gastroesophageal reflux disease)   . History of thrombocytopenia   . Hypercholesterolemia   . Hyperglycemia   . Hypertension   . Osteoarthritis   . Osteoporosis    s/p Actonel, Reclast (last 2011)  . Palpitations   . Pre-diabetes   . Psoriasis   . Reflux esophagitis    Past Surgical History:  Procedure Laterality Date  . ABDOMINAL HYSTERECTOMY  1980  . BREAST EXCISIONAL BIOPSY Left 1976   benign  . COLONOSCOPY WITH PROPOFOL N/A 05/05/2018   Procedure: COLONOSCOPY WITH PROPOFOL;  Surgeon: Scot Jun, MD;  Location: [facility] ENDOSCOPY;  Service: Endoscopy;  Laterality: N/A;  . ESOPHAGOGASTRODUODENOSCOPY (EGD) WITH PROPOFOL N/A 05/05/2018   Procedure: ESOPHAGOGASTRODUODENOSCOPY (EGD) WITH PROPOFOL;  Surgeon: Scot Jun, MD;  Location: [facility] ENDOSCOPY;  Service: Endoscopy;  Laterality: N/A;  . THUMB AMPUTATION Left 2015   Family History  Problem Relation Age of Onset  . Heart disease Mother   . Hypertension Mother   . Mental illness Other        sibling, suicide  . Colon cancer Sister   . Sleep apnea Brother   . Breast cancer Neg Hx    Social History   Socioeconomic History  . Marital status: Widowed    Spouse name: Not on file  . Number of children: 1  . Years of education: Not on file  . Highest education level: Not on file  Occupational History  . Not on  file  Tobacco Use  . Smoking status: Former Smoker    Quit date: 1999    Years since quitting: 22.9  . Smokeless tobacco: Never Used  Vaping Use  . Vaping Use: Never used  Substance and Sexual Activity  . Alcohol use: No    Alcohol/week: 0.0 standard drinks  . Drug use: No  . Sexual activity: Never  Other Topics Concern  . Not on file  Social History Narrative  . Not on file   Social Determinants of Health   Financial Resource Strain:   . Difficulty of Paying Living Expenses: Not on file  Food Insecurity:   . Worried About Charity fundraiser in the Last Year: Not on file  . Ran Out of Food in  the Last Year: Not on file  Transportation Needs:   . Lack of Transportation (Medical): Not on file  . Lack of Transportation (Non-Medical): Not on file  Physical Activity:   . Days of Exercise per Week: Not on file  . Minutes of Exercise per Session: Not on file  Stress:   . Feeling of Stress : Not on file  Social Connections:   . Frequency of Communication with Friends and Family: Not on file  . Frequency of Social Gatherings with Friends and Family: Not on file  . Attends Religious Services: Not on file  . Active Member of Clubs or Organizations: Not on file  . Attends Archivist Meetings: Not on file  . Marital Status: Not on file    Outpatient Encounter Medications as of 10/16/2020  Medication Sig  . acebutolol (SECTRAL) 200 MG capsule TAKE 2 CAPSULES BY MOUTH EVERY MORNING & 1 CAPSULE EVERY EVENING  . amLODipine (NORVASC) 5 MG tablet TAKE 1 TABLET BY MOUTH EVERY DAY  . aspirin 81 MG tablet Take 81 mg by mouth daily.  . calcium carbonate (OS-CAL) 1250 MG chewable tablet Chew 1 tablet by mouth 2 (two) times daily.  . calcium elemental as carbonate (TUMS ULTRA 1000) 400 MG chewable tablet Chew by mouth.  . clobetasol ointment (TEMOVATE) 0.63 % Apply 1 application topically 2 (two) times daily.  Marland Kitchen esomeprazole (NEXIUM) 40 MG capsule TAKE ONE CAPSULE BY Mouth daily.  Marland Kitchen losartan (COZAAR) 100 MG tablet TAKE 1 TABLET BY MOUTH EVERY DAY  . magnesium oxide (MAG-OX) 400 MG tablet Take 400 mg by mouth daily.  . nitroGLYCERIN (NITROSTAT) 0.4 MG SL tablet Place 1 tablet (0.4 mg total) under the tongue every 5 (five) minutes as needed for chest pain. May repeat x 1.  If persistent pain, call 911  . simvastatin (ZOCOR) 40 MG tablet TAKE 1 TABLET BY MOUTH EVERYDAY AT BEDTIME  . triamcinolone cream (KENALOG) 0.1 % Apply 1 application topically 2 (two) times daily.   No facility-administered encounter medications on file as of 10/16/2020.    Review of Systems  Constitutional:        Has adjusted her diet.  Lost weight.    HENT: Negative for congestion, sinus pressure and sore throat.   Eyes: Negative for pain and visual disturbance.  Respiratory: Negative for cough, chest tightness and shortness of breath.   Cardiovascular: Positive for palpitations. Negative for chest pain and leg swelling.  Gastrointestinal: Negative for diarrhea.       Intermittent episodes of vomiting and abdominal discomfort as outlined.    Genitourinary: Negative for difficulty urinating and dysuria.  Musculoskeletal: Negative for back pain and joint swelling.  Skin: Negative for color change and rash.  Neurological: Negative for dizziness, light-headedness and headaches.  Hematological: Negative for adenopathy. Does not bruise/bleed easily.  Psychiatric/Behavioral: Negative for agitation and dysphoric mood.       Objective:    Physical Exam Vitals reviewed.  Constitutional:      General: She is not in acute distress.    Appearance: Normal appearance. She is well-developed.  HENT:     Head: Normocephalic and atraumatic.     Right Ear: External ear normal.     Left Ear: External ear normal.  Eyes:     General: No scleral icterus.       Right eye: No discharge.        Left eye: No discharge.     Conjunctiva/sclera: Conjunctivae normal.  Neck:     Thyroid: No thyromegaly.  Cardiovascular:     Rate and Rhythm: Normal rate and regular rhythm.  Pulmonary:     Effort: No tachypnea, accessory muscle usage or respiratory distress.     Breath sounds: Normal breath sounds. No decreased breath sounds or wheezing.  Chest:     Breasts:        Right: No inverted nipple, mass, nipple discharge or tenderness (no axillary adenopathy).        Left: No inverted nipple, mass, nipple discharge or tenderness (no axilarry adenopathy).  Abdominal:     General: Bowel sounds are normal.     Palpations: Abdomen is soft.     Tenderness: There is no abdominal tenderness.  Musculoskeletal:        General:  No swelling or tenderness.     Cervical back: Neck supple. No tenderness.  Lymphadenopathy:     Cervical: No cervical adenopathy.  Skin:    Findings: No erythema or rash.  Neurological:     Mental Status: She is alert and oriented to person, place, and time.  Psychiatric:        Mood and Affect: Mood normal.        Behavior: Behavior normal.     BP 122/68   Pulse (!) 57   Temp 98 F (36.7 C) (Oral)   Resp 16   Ht 5' (1.524 m)   Wt 126 lb 9.6 oz (57.4 kg)   LMP 11/09/1979   SpO2 98%   BMI 24.72 kg/m  Wt Readings from Last 3 Encounters:  10/16/20 126 lb 9.6 oz (57.4 kg)  08/08/20 129 lb 6.4 oz (58.7 kg)  06/10/20 136 lb (61.7 kg)     Lab Results  Component Value Date   WBC 6.2 08/08/2020   HGB 12.6 08/08/2020   HCT 37.5 08/08/2020   PLT 232.0 08/08/2020   GLUCOSE 97 08/27/2020   CHOL 128 08/08/2020   TRIG 146.0 08/08/2020   HDL 45.90 08/08/2020   LDLDIRECT 75.0 01/06/2018   LDLCALC 53 08/08/2020   ALT 12 08/08/2020   AST 18 08/08/2020   NA 139 08/27/2020   K 4.6 08/27/2020   CL 103 08/27/2020   CREATININE 0.89 08/27/2020   BUN 15 08/27/2020   CO2 31 08/27/2020   TSH 1.03 08/08/2020   HGBA1C 6.2 08/08/2020   MICROALBUR <0.7 08/27/2020    MM 3D SCREEN BREAST BILATERAL  Result Date: 10/03/2020 CLINICAL DATA:  Screening. EXAM: DIGITAL SCREENING BILATERAL MAMMOGRAM WITH TOMO AND CAD COMPARISON:  Previous exam(s). ACR Breast Density Category b: There are scattered areas of fibroglandular density. FINDINGS: There are no findings suspicious for malignancy. Images were processed with CAD. IMPRESSION: No mammographic evidence of malignancy. A result letter of  this screening mammogram will be mailed directly to the patient. RECOMMENDATION: Screening mammogram in one year. (Code:SM-B-01Y) BI-RADS CATEGORY  1: Negative. Electronically Signed   By: Valentino Saxon MD   On: 10/03/2020 14:37       Assessment & Plan:   Problem List Items Addressed This Visit     Nausea    Persistent intermittent flares of nausea/vomiting.  Most recent occurred yesterday.  She vomiting undigested food a few hours after eating.  Has had extensive w/up as outlined.  On nexium.  Discussed possible gastroparesis, etc.  Has appt with Dr Vicente Males 12.1.21.        Relevant Orders   Referral to Nutrition and Diabetes Services   Lymphocytic colitis    Found on pathology - colonoscopy.  Was on budesonide.  Still with persistent symptoms.  Request second opinion.  Has appt with Dr Vicente Males in 10/2020.       Hypercholesterolemia    On simvastatin.  Follow lipid panel and liver function tests.        Relevant Orders   Lipid panel   Hepatic function panel   Health care maintenance    Physical today 10/16/20.  Mammogram 10/03/20 - Birads I.  Colonoscopy 04/2018 - internal hemorrhoids, diverticulosis, otherwise normal.       GERD (gastroesophageal reflux disease)    EGD with gastritis.  Remains on nexium.  Still with intermittent vomiting/nausea as outlined.        Essential hypertension    Blood pressure doing well on amlodipine and losartan.  Check by me today 128/62.  Follow pressures.  Follow met b .       Diabetes ([place])    Follow met b and a1c.       Relevant Orders   Referral to Nutrition and Diabetes Services   Hemoglobin J2T   Basic metabolic panel   Abdominal pain    Continued intermittent episodes of abdominal pain, nausea and vomiting.  Has had extensive GI w/up as outlined.  Pathology from colonoscopy suggestive of lymphocytic colitis.  EGD with gastritis.  Celiac testing negative.  S/p treatment for SIBO.  Off budesonide.  Adjusting dietary intake.  Has lost weight.  Still with flares. Requested second opinion.  Has appt with Dr Vicente Males in December.  Continues on nexium.       Relevant Orders   Referral to Nutrition and Diabetes Services       Einar Pheasant, MD

## 2020-10-26 ENCOUNTER — Encounter: Payer: Self-pay | Admitting: Internal Medicine

## 2020-10-26 NOTE — Assessment & Plan Note (Signed)
Blood pressure doing well on amlodipine and losartan.  Check by me today 128/62.  Follow pressures.  Follow met b .

## 2020-10-26 NOTE — Assessment & Plan Note (Signed)
On simvastatin.  Follow lipid panel and liver function tests.   

## 2020-10-26 NOTE — Assessment & Plan Note (Signed)
EGD with gastritis.  Remains on nexium.  Still with intermittent vomiting/nausea as outlined.

## 2020-10-26 NOTE — Assessment & Plan Note (Signed)
Found on pathology - colonoscopy.  Was on budesonide.  Still with persistent symptoms.  Request second opinion.  Has appt with Dr Vicente Males in 10/2020.

## 2020-10-26 NOTE — Assessment & Plan Note (Signed)
Follow met b and a1c.  

## 2020-10-26 NOTE — Assessment & Plan Note (Signed)
Continued intermittent episodes of abdominal pain, nausea and vomiting.  Has had extensive GI w/up as outlined.  Pathology from colonoscopy suggestive of lymphocytic colitis.  EGD with gastritis.  Celiac testing negative.  S/p treatment for SIBO.  Off budesonide.  Adjusting dietary intake.  Has lost weight.  Still with flares. Requested second opinion.  Has appt with Dr Vicente Males in December.  Continues on nexium.

## 2020-10-26 NOTE — Assessment & Plan Note (Addendum)
Physical today 10/16/20.  Mammogram 10/03/20 - Birads I.  Colonoscopy 04/2018 - internal hemorrhoids, diverticulosis, otherwise normal.

## 2020-10-26 NOTE — Assessment & Plan Note (Signed)
Persistent intermittent flares of nausea/vomiting.  Most recent occurred yesterday.  She vomiting undigested food a few hours after eating.  Has had extensive w/up as outlined.  On nexium.  Discussed possible gastroparesis, etc.  Has appt with Dr Vicente Males 12.1.21.

## 2020-10-29 ENCOUNTER — Other Ambulatory Visit: Payer: Self-pay

## 2020-10-29 ENCOUNTER — Ambulatory Visit: Payer: Medicare Other | Admitting: Gastroenterology

## 2020-10-29 ENCOUNTER — Encounter: Payer: Self-pay | Admitting: Gastroenterology

## 2020-10-29 VITALS — BP 98/62 | HR 67 | Ht 60.0 in | Wt 125.4 lb

## 2020-10-29 DIAGNOSIS — K3184 Gastroparesis: Secondary | ICD-10-CM | POA: Diagnosis not present

## 2020-10-29 DIAGNOSIS — R110 Nausea: Secondary | ICD-10-CM

## 2020-10-29 DIAGNOSIS — K52839 Microscopic colitis, unspecified: Secondary | ICD-10-CM | POA: Diagnosis not present

## 2020-10-29 MED ORDER — METOCLOPRAMIDE HCL 5 MG PO TABS: 5.0000 mg | ORAL_TABLET | Freq: Three times a day (TID) | ORAL | 0 refills | Status: DC | PRN

## 2020-10-29 NOTE — Progress Notes (Signed)
Jonathon Bellows MD, MRCP(U.K) [address]  [place]  [place], [place] [address]  Main: [phone]  Fax: [phone]   Gastroenterology Consultation  Referring Provider:     Einar Pheasant, MD Primary Care Physician:  Einar Pheasant, MD Primary Gastroenterologist:  Dr. Jonathon Bellows  Reason for Consultation:     Nausea and abdominal pain        HPI:   Teresa Wade is a 86 y.o. y/o female referred for consultation & management  by Dr. Einar Pheasant, MD.     She is a patient who has previously been seen and evaluated by [facility] last office visit was in May 2020.  She has a history of chronic nausea, vomiting, abdominal cramping.  Per last office visit in May 2020and last EGD was in 2013.  Colonoscopy in 2019 showed sigmoid diverticulosis and biopsy results of the colon suggestive of lymphocytic colitis.  Had a gastric emptying study back then which was normal was seen by [facility] for a second opinion and was scheduled for bacterial breath overgrowth breath test which was subsequently positive in November 2019 and treated with clear Keflex and Flagyl at her last visit in 2020 was in budesonide 3 mg a day and also had completed a course of Xifaxan for 14 days.  Plan at her last visit was addressing her depression or anxiety.  She had requested follow-up with [facility] a DEXA scan was recommended at that time which she wanted to discuss with her doctor.  She states that she has had issues with nausea and vomiting for many years.  Has not been given a clear diagnosis.  She says the vomiting and nausea is usually in the morning and she brings out hold undigested food that she had eaten previously.  Foods which are high in fat content make it worse.  Denies any headaches or imbalance.  Some weight loss.  Denies any diarrhea.  Has 1 bowel movement that is hard every other day.  Takes MiraLAX as needed.  Some abdominal discomfort.  Past Medical History:  Diagnosis Date  . Abnormal  chest CT    right hilar cyst  . Anxiety   . Fibrocystic disease of breast   . GERD (gastroesophageal reflux disease)   . History of thrombocytopenia   . Hypercholesterolemia   . Hyperglycemia   . Hypertension   . Osteoarthritis   . Osteoporosis    s/p Actonel, Reclast (last 2011)  . Palpitations   . Pre-diabetes   . Psoriasis   . Reflux esophagitis     Past Surgical History:  Procedure Laterality Date  . ABDOMINAL HYSTERECTOMY  1980  . BREAST EXCISIONAL BIOPSY Left 1976   benign  . COLONOSCOPY WITH PROPOFOL N/A 05/05/2018   Procedure: COLONOSCOPY WITH PROPOFOL;  Surgeon: Manya Silvas, MD;  Location[facility] ENDOSCOPY;  Service: Endoscopy;  Laterality: N/A;  . ESOPHAGOGASTRODUODENOSCOPY (EGD) WITH PROPOFOL N/A 05/05/2018   Procedure: ESOPHAGOGASTRODUODENOSCOPY (EGD) WITH PROPOFOL;  Surgeon: Manya Silvas, MD;  Location: [facility] ENDOSCOPY;  Service: Endoscopy;  Laterality: N/A;  . THUMB AMPUTATION Left 2015    Prior to Admission medications   Medication Sig Start Date End Date Taking? Authorizing Provider  acebutolol (SECTRAL) 200 MG capsule TAKE 2 CAPSULES BY MOUTH EVERY MORNING & 1 CAPSULE EVERY EVENING 06/18/20  Yes Einar Pheasant, MD  amLODipine (NORVASC) 5 MG tablet TAKE 1 TABLET BY MOUTH EVERY DAY 09/15/20  Yes Einar Pheasant, MD  aspirin 81 MG tablet Take 81 mg  by mouth daily.   Yes [provider]  calcium carbonate (OS-CAL) 1250 MG chewable tablet Chew 1 tablet by mouth 2 (two) times daily.   Yes [provider]  calcium elemental as carbonate (TUMS ULTRA 1000) 400 MG chewable tablet Chew by mouth.   Yes [provider]  clobetasol ointment (TEMOVATE) 9.32 % Apply 1 application topically 2 (two) times daily.   Yes [provider]  esomeprazole (NEXIUM) 40 MG capsule TAKE ONE CAPSULE BY Mouth daily. 08/16/19  Yes Einar Pheasant, MD  losartan (COZAAR) 100 MG tablet TAKE 1 TABLET BY MOUTH EVERY DAY 09/15/20  Yes Einar Pheasant, MD    magnesium oxide (MAG-OX) 400 MG tablet Take 400 mg by mouth daily.   Yes [provider]  nitroGLYCERIN (NITROSTAT) 0.4 MG SL tablet Place 1 tablet (0.4 mg total) under the tongue every 5 (five) minutes as needed for chest pain. May repeat x 1.  If persistent pain, call 911 03/11/15  Yes Einar Pheasant, MD  simvastatin (ZOCOR) 40 MG tablet TAKE 1 TABLET BY MOUTH EVERYDAY AT BEDTIME 07/03/20  Yes Einar Pheasant, MD  triamcinolone cream (KENALOG) 0.1 % Apply 1 application topically 2 (two) times daily. 06/27/19  Yes Einar Pheasant, MD    Family History  Problem Relation Age of Onset  . Heart disease Mother   . Hypertension Mother   . Mental illness Other        sibling, suicide  . Colon cancer Sister   . Sleep apnea Brother   . Breast cancer Neg Hx      Social History   Tobacco Use  . Smoking status: Former Smoker    Quit date: 1999    Years since quitting: 22.9  . Smokeless tobacco: Never Used  Vaping Use  . Vaping Use: Never used  Substance Use Topics  . Alcohol use: No    Alcohol/week: 0.0 standard drinks  . Drug use: No    Allergies as of 10/29/2020 - Review Complete 10/29/2020  Allergen Reaction Noted  . Amoxicillin-pot clavulanate Other (See Comments) 12/25/2018  . Clarithromycin  11/08/2012  . Lodine [etodolac] Diarrhea 11/07/2012  . Minocycline Other (See Comments) 11/08/2012  . Naproxen sodium  06/22/2010  . Rofecoxib  06/22/2010  . Vibramycin [doxycycline calcium] Other (See Comments) 11/07/2012  . Mobic [meloxicam] Rash 11/07/2012    Review of Systems:    All systems reviewed and negative except where noted in HPI.   Physical Exam:  BP 98/62 (BP Location: Left Arm, Patient Position: Sitting, Cuff Size: Normal)   Pulse 67   Ht 5' (1.524 m)   Wt 125 lb 6.4 oz (56.9 kg)   LMP 11/09/1979   BMI 24.49 kg/m  Patient's last menstrual period was 11/09/1979. Psych:  Alert and cooperative. Normal mood and affect. General:   Alert,  Well-developed,  well-nourished, pleasant and cooperative in NAD Head:  Normocephalic and atraumatic. Eyes:  Sclera clear, no icterus.   Conjunctiva pink. Ears:  Normal auditory acuity. Lungs:  Respirations even and unlabored.  Clear throughout to auscultation.   No wheezes, crackles, or rhonchi. No acute distress. Heart:  Regular rate and rhythm; no murmurs, clicks, rubs, or gallops. Abdomen:  Normal bowel sounds.  No bruits.  Soft, non-tender and non-distended without masses, hepatosplenomegaly or hernias noted.  No guarding or rebound tenderness.    Neurologic:  Alert and oriented x3;  grossly normal neurologically. Psych:  Alert and cooperative. Normal mood and affect.  Imaging Studies: MM 3D SCREEN BREAST BILATERAL  Result Date: 10/03/2020 CLINICAL DATA:  Screening. EXAM: DIGITAL SCREENING BILATERAL MAMMOGRAM WITH TOMO AND CAD COMPARISON:  Previous exam(s). ACR Breast Density Category b: There are scattered areas of fibroglandular density. FINDINGS: There are no findings suspicious for malignancy. Images were processed with CAD. IMPRESSION: No mammographic evidence of malignancy. A result letter of this screening mammogram will be mailed directly to the patient. RECOMMENDATION: Screening mammogram in one year. (Code:SM-B-01Y) BI-RADS CATEGORY  1: Negative. Electronically Signed   By: Valentino Saxon MD   On: 10/03/2020 14:37    Assessment and Plan:   Fatime Biswell Schooler is a 86 y.o. y/o female who has been extensively investigated by [facility] and [facility] in the past and has been treated for lymphocytic colitis, bacterial overgrowth syndrome, has undergone a cholecystectomy and has had a negative gastric emptying study.  She has a chronic history of nausea vomiting abdominal discomfort.  Functional disorders has been entertained as well in the past.  She is here to see me as a third gastroenterologist.  Reviewing her records, history her symptoms are suggestive of gastroparesis.  I do note that her  gastric emptying study is normal.  The study can be normal if done during the.  When she has no gastroparesis.  The gastroparesis could be episodic.  She is diabetic but not on any medication.  The fact that she brings up undigested food from her previous meal does indicate that the food is not getting digested and going downstream adequately.  She also has some constipation.  She is not taking budesonide and does not need to take any at this point of time as she has no diarrhea.  Plan 1.  Commence on MiraLAX half a packet daily to treat constipation.  At times constipation can also cause nausea. 2.  Low fiber diet: Gastroparesis diet.  Patient information will be provided. 3.  I would also suggest we get a HIDA scan to rule out gallbladder dyskinesia which can also lead to chronic nausea at times 4.  I will give her about 15 tablets of Reglan 5 mg to be used as needed as needed for nausea.  I did discuss with her that long-term usage is not recommended as it can lead to extrapyramidal symptoms.   5.  I will give her back call in 4 to 5 weeks time.  If not feeling any better than the options would include imaging and endoscopy. I have spent over 45 minutes reviewing her old records, previous GI records and tests.  Follow up in 4 weeks telephone visit  Dr Jonathon Bellows MD,MRCP(U.K)

## 2020-10-29 NOTE — Patient Instructions (Signed)
Gastroparesis  Gastroparesis is a condition in which food takes longer than normal to empty from the stomach. The condition is usually long-lasting (chronic). It may also be called delayed gastric emptying. There is no cure, but there are treatments and things that you can do at home to help relieve symptoms. Treating the underlying condition that causes gastroparesis can also help relieve symptoms. What are the causes? In many cases, the cause of this condition is not known. Possible causes include:  A hormone (endocrine) disorder, such as hypothyroidism or diabetes.  A nervous system disease, such as Parkinson's disease or multiple sclerosis.  Cancer, infection, or surgery that affects the stomach or vagus nerve. The vagus nerve runs from your chest, through your neck, to the lower part of your brain.  A connective tissue disorder, such as scleroderma.  Certain medicines. What increases the risk? You are more likely to develop this condition if you:  Have certain disorders or diseases, including: ? An endocrine disorder. ? An eating disorder. ? Amyloidosis. ? Scleroderma. ? Parkinson's disease. ? Multiple sclerosis. ? Cancer or infection of the stomach or the vagus nerve.  Have had surgery on the stomach or vagus nerve.  Take certain medicines.  Are female. What are the signs or symptoms? Symptoms of this condition include:  Feeling full after eating very little.  Nausea.  Vomiting.  Heartburn.  Abdominal bloating.  Inconsistent blood sugar (glucose) levels on blood tests.  Lack of appetite.  Weight loss.  Acid from the stomach coming up into the esophagus (gastroesophageal reflux).  Sudden tightening (spasm) of the stomach, which can be painful. Symptoms may come and go. Some people may not notice any symptoms. How is this diagnosed? This condition is diagnosed with tests, such as:  Tests that check how long it takes food to move through the stomach and  intestines. These tests include: ? Upper gastrointestinal (GI) series. For this test, you drink a liquid that shows up well on X-rays, and then X-rays will be taken of your intestines. ? Gastric emptying scintigraphy. For this test, you eat food that contains a small amount of radioactive material, and then scans are taken. ? Wireless capsule GI monitoring system. For this test, you swallow a pill (capsule) that records information about how foods and fluid move through your stomach.  Gastric manometry. For this test, a tube is passed down your throat and into your stomach to measure electrical and muscular activity.  Endoscopy. For this test, a long, thin tube is passed down your throat and into your stomach to check for problems in your stomach lining.  Ultrasound. This test uses sound waves to create images of inside the body. This can help rule out gallbladder disease or pancreatitis as a cause of your symptoms. How is this treated? There is no cure for gastroparesis. Treatment may include:  Treating the underlying cause.  Managing your symptoms by making changes to your diet and exercise habits.  Taking medicines to control nausea and vomiting and to stimulate stomach muscles.  Getting food through a feeding tube in the hospital. This may be done in severe cases.  Having surgery to insert a device into your body that helps improve stomach emptying and control nausea and vomiting (gastric neurostimulator). Follow these instructions at home:  Take over-the-counter and prescription medicines only as told by your health care provider.  Follow instructions from your health care provider about eating or drinking restrictions. Your health care provider may recommend that you: ? Eat   smaller meals more often. ? Eat low-fat foods. ? Eat low-fiber forms of high-fiber foods. For example, eat cooked vegetables instead of raw vegetables. ? Have only liquid foods instead of solid foods. Liquid  foods are easier to digest.  Drink enough fluid to keep your urine pale yellow.  Exercise as often as told by your health care provider.  Keep all follow-up visits as told by your health care provider. This is important. Contact a health care provider if you:  Notice that your symptoms do not improve with treatment.  Have new symptoms. Get help right away if you:  Have severe abdominal pain that does not improve with treatment.  Have nausea that is severe or does not go away.  Cannot drink fluids without vomiting. Summary  Gastroparesis is a chronic condition in which food takes longer than normal to empty from the stomach.  Symptoms include nausea, vomiting, heartburn, abdominal bloating, and loss of appetite.  Eating smaller portions, and low-fat, low-fiber foods may help you manage your symptoms.  Get help right away if you have severe abdominal pain. This information is not intended to replace advice given to you by your health care provider. Make sure you discuss any questions you have with your health care provider. Document Revised: 02/13/2018 Document Reviewed: 09/20/2017 Elsevier Patient Education  2020 [employer]  

## 2020-11-04 ENCOUNTER — Other Ambulatory Visit: Payer: Self-pay

## 2020-11-04 DIAGNOSIS — R112 Nausea with vomiting, unspecified: Secondary | ICD-10-CM

## 2020-11-24 ENCOUNTER — Encounter: Payer: Medicare Other | Attending: Internal Medicine | Admitting: Dietician

## 2020-11-24 ENCOUNTER — Encounter: Payer: Self-pay | Admitting: Dietician

## 2020-11-24 ENCOUNTER — Other Ambulatory Visit: Payer: Self-pay

## 2020-11-24 VITALS — Ht 60.0 in | Wt 126.1 lb

## 2020-11-24 DIAGNOSIS — R109 Unspecified abdominal pain: Secondary | ICD-10-CM | POA: Diagnosis not present

## 2020-11-24 DIAGNOSIS — R110 Nausea: Secondary | ICD-10-CM | POA: Insufficient documentation

## 2020-11-24 DIAGNOSIS — E1165 Type 2 diabetes mellitus with hyperglycemia: Secondary | ICD-10-CM | POA: Insufficient documentation

## 2020-11-24 NOTE — Progress Notes (Signed)
Medical Nutrition Therapy: Visit start time: 1530  end time: 1630  Assessment:  Diagnosis: abdominal pain, nausea, Type 2 DM Past medical history: HTN, hyperlipidemia, GERD Psychosocial issues/ stress concerns: none  Preferred learning method:  . Auditory . Visual   Current weight: 126.1lbs Height: 5'0" Medications, supplements: reconciled list in medical record  Progress and evaluation:   Patient reports having issues with intermittent post-prandial nausea and some vomiting for several years. Often feels bloated after eating. Usually vomits undigested food, and then has abdominal pain for 2-3 hours.  The symptoms have recently worsened so she has undergone testing/ gastroenterology visit 10/29/20, and has visit summary indicating possible gastroparesis.  Skipped breakfast yesterday to make sure she did not get sick at church, then was very hungry for lunch, ate a full meal, then became ill.   Recent HbA1C was 6.2% (08/08/20).   Physical activity: none at this time  Dietary Intake:  Usual eating pattern includes 3 meals and 1-2 snacks per day. Dining out frequency: 2 meals per week.  Breakfast: oatmeal (if felt bad the previous day) 1/4c reg oats; egg, ham, 1pc toast Snack: often none; occasionally small ginger snap Lunch: 1/2 sandwich with tomato or pimento cheese or peanut butter, sometimes with fruit ie cuties/ halo mandarin (no raw apples);  Snack: popcorn -- small portion, 1 pc hard candy or chocolate Supper: (tries to limit portions) chicken or meat loaf/ fish/ steak rarely  -- now broiled + potato baked or creamed, green beans; pintos or white beans and cornbread Snack: sometimes banana popsicle (low sugar) Beverages: water, Nature's Twist sugar free orangeade and strawberry lemonade, occasional diet Dr. Reino Kent  Nutrition Care Education: Topics covered:  Basic nutrition: basic food groups, appropriate nutrient balance, appropriate meal and snack schedule, general nutrition  guidelines    GI symtpoms/ Gastroparesis: importance of low fat and low fiber choices and examples of acceptable/ not acceptable foods; eating small, frequent meals; limiting fluid intake during meals; avoiding caffeine and carbonated beverages; avoiding spicy/ acidic foods; eating slowly and chewing foods thoroughly; sitting or standing up after eating and engaging in light movement Diabetes: appropriate meal and snack schedule, appropriate carb intake   Nutritional Diagnosis:  Blue-1.4 Altered GI function As related to intermittent nausea, vomiting, and abdominal pain after meals.  As evidenced by patient reported symptoms and history, and unintentional weight loss.  Intervention:  . Instruction and discussion as noted above. . Patient has been working to reduce high fat choices, and is avoiding some foods which she knows cause GI distress. . Established goals for additional change, with input from patient. . Patient will plan to return for MNT follow up after her next MD appointment.  Education Materials given:  Marland Kitchen Gastroparesis Nutrition Therapy (NCM) . Visit summary with goals/ instructions   Learner/ who was taught:  . Patient    Level of understanding: Marland Kitchen Verbalizes/ demonstrates competency   Demonstrated degree of understanding via:   Teach back Learning barriers: . None   Willingness to learn/ readiness for change: . Eager, change in progress   Monitoring and Evaluation:  Dietary intake, exercise, BG control, and body weight      follow up: 01/12/21 at 2:00pm

## 2020-11-24 NOTE — Patient Instructions (Signed)
·   Use "quick" or instant oatmeal for breakfast.   If eating a sandwich for lunch, try leftover chicken chopped (or canned tuna), and mix with light mayonnaise, or deli [place] with 2% or "light" cheese slice.  Choose pretzels or pita chips instead of popcorn for a snack, or goldfish crackers or saltines.  Chew foods thoroughly, make sure meats are moist, not tough and dry.   If you eat pintos or other beans, eat only a small portion. Try taking a "beano" supplement when eating beans.  Eat small meals and snacks every 3-4 hours during the day.   Avoid drinking fluids during meals so stomach does not get overly full.

## 2020-12-05 ENCOUNTER — Other Ambulatory Visit: Payer: Self-pay

## 2020-12-05 ENCOUNTER — Encounter
Admission: RE | Admit: 2020-12-05 | Discharge: 2020-12-05 | Disposition: A | Discharge status: Home / Self Care | Payer: Medicare Other | Source: Ambulatory Visit | Attending: Gastroenterology | Admitting: Gastroenterology

## 2020-12-05 DIAGNOSIS — R112 Nausea with vomiting, unspecified: Secondary | ICD-10-CM | POA: Diagnosis present

## 2020-12-05 MED ORDER — TECHNETIUM TC 99M MEBROFENIN IV KIT
5.1900 | PACK | Freq: Once | INTRAVENOUS | Status: AC | PRN
  Administered 2020-12-05: 5.19 via INTRAVENOUS

## 2020-12-09 ENCOUNTER — Encounter: Payer: Self-pay | Admitting: Gastroenterology

## 2020-12-10 ENCOUNTER — Other Ambulatory Visit: Payer: Self-pay | Admitting: Physician Assistant

## 2020-12-10 ENCOUNTER — Other Ambulatory Visit (HOSPITAL_COMMUNITY): Payer: Self-pay | Admitting: Physician Assistant

## 2020-12-10 DIAGNOSIS — H9202 Otalgia, left ear: Secondary | ICD-10-CM

## 2020-12-17 ENCOUNTER — Other Ambulatory Visit: Payer: Self-pay | Admitting: Internal Medicine

## 2020-12-22 ENCOUNTER — Telehealth: Payer: Self-pay

## 2020-12-22 ENCOUNTER — Other Ambulatory Visit: Payer: Self-pay

## 2020-12-22 ENCOUNTER — Ambulatory Visit: Payer: Medicare Other | Admitting: Internal Medicine

## 2020-12-22 ENCOUNTER — Encounter: Payer: Self-pay | Admitting: Internal Medicine

## 2020-12-22 ENCOUNTER — Ambulatory Visit (INDEPENDENT_AMBULATORY_CARE_PROVIDER_SITE_OTHER): Payer: Medicare Other

## 2020-12-22 VITALS — BP 136/70 | HR 67 | Temp 98.5°F | Ht 60.0 in | Wt 123.2 lb

## 2020-12-22 DIAGNOSIS — M4187 Other forms of scoliosis, lumbosacral region: Secondary | ICD-10-CM

## 2020-12-22 DIAGNOSIS — M5432 Sciatica, left side: Secondary | ICD-10-CM

## 2020-12-22 MED ORDER — PREDNISONE 10 MG PO TABS: ORAL_TABLET | ORAL | 0 refills | Status: DC

## 2020-12-22 NOTE — Progress Notes (Signed)
Subjective:  Patient ID: Teresa Wade, female    DOB: 1932-01-17  Age: 87 y.o. MRN: [id number]  CC: The primary encounter diagnosis was Sciatica of left side. Diagnoses of Sciatica, left side and Dextroscoliosis of lumbosacral spine were also pertinent to this visit.  HPI Teresa Wade presents for left leg pain   This visit occurred during the SARS-CoV-2 public health emergency.  Safety protocols were in place, including screening questions prior to the visit, additional usage of staff PPE, and extensive cleaning of exam room while observing appropriate contact time as indicated for disinfecting solutions.   LEG PAIN FOR SEVERAL WEEKS,  Pain is located From the knee down,  Occasionally occurs above the knee anteriorly .  Felt like it started in the hip.  Right leg not affected . Pain STARTED AFTER  NO PARTICULAR ACITIVITY,  NO FALL.  Leg pain is aggravated by standing and walking.    No foot drop or scuffing of foot.     Also having low back pain, described as  midline with periodic radiation to left leg .  She has no history of surgery or scoliosis .  Has osteoporosis  , treated with medication.  Treated by a chiropractor several years ago  For lower back issues. Tried exercises for sciatic    Outpatient Medications Prior to Visit  Medication Sig Dispense Refill  . acebutolol (SECTRAL) 200 MG capsule TAKE 2 CAPSULES BY MOUTH EVERY MORNING & 1 CAPSULE EVERY EVENING 270 capsule 2  . amLODipine (NORVASC) 5 MG tablet TAKE 1 TABLET BY MOUTH EVERY DAY 90 tablet 1  . aspirin 81 MG tablet Take 81 mg by mouth daily.    . calcium carbonate (OS-CAL) 1250 MG chewable tablet Chew 1 tablet by mouth 2 (two) times daily.    Marland Kitchen esomeprazole (NEXIUM) 40 MG capsule TAKE ONE CAPSULE BY Mouth daily. 90 capsule 1  . losartan (COZAAR) 100 MG tablet TAKE 1 TABLET BY MOUTH EVERY DAY 90 tablet 1  . magnesium oxide (MAG-OX) 400 MG tablet Take 400 mg by mouth daily.    . simvastatin (ZOCOR) 40 MG tablet TAKE  1 TABLET BY MOUTH EVERYDAY AT BEDTIME 90 tablet 1  . calcium elemental as carbonate (BARIATRIC TUMS ULTRA) 400 MG chewable tablet Chew by mouth. (Patient not taking: Reported on 12/22/2020)    . clobetasol ointment (TEMOVATE) [id number] % Apply 1 application topically 2 (two) times daily. (Patient not taking: Reported on 12/22/2020)    . metoCLOPramide (REGLAN) 5 MG tablet Take 1 tablet (5 mg total) by mouth every 8 (eight) hours as needed for nausea or vomiting. (Patient not taking: Reported on 12/22/2020) 20 tablet 0  . nitroGLYCERIN (NITROSTAT) 0.4 MG SL tablet Place 1 tablet (0.4 mg total) under the tongue every 5 (five) minutes as needed for chest pain. May repeat x 1.  If persistent pain, call 911 (Patient not taking: Reported on 12/22/2020) 25 tablet 0  . triamcinolone cream (KENALOG) 0.1 % Apply 1 application topically 2 (two) times daily. (Patient not taking: Reported on 12/22/2020) 45 g 0   No facility-administered medications prior to visit.    Review of Systems;  Patient denies headache, fevers, malaise, unintentional weight loss, skin rash, eye pain, sinus congestion and sinus pain, sore throat, dysphagia,  hemoptysis , cough, dyspnea, wheezing, chest pain, palpitations, orthopnea, edema, abdominal pain, nausea, melena, diarrhea, constipation, flank pain, dysuria, hematuria, urinary  Frequency, nocturia, numbness, tingling, seizures,  Focal weakness, Loss of consciousness,  Tremor, insomnia, depression,  anxiety, and suicidal ideation.      Objective:  BP 136/70 (BP Location: Left Arm, Patient Position: Sitting, Cuff Size: Normal)   Pulse 67   Temp 98.5 F (36.9 C) (Oral)   Ht 5' (1.524 m)   Wt 123 lb 3.2 oz (55.9 kg)   LMP 11/09/1979   SpO2 96%   BMI 24.06 kg/m   BP Readings from Last 3 Encounters:  12/22/20 136/70  10/29/20 98/62  10/16/20 122/68    Wt Readings from Last 3 Encounters:  12/22/20 123 lb 3.2 oz (55.9 kg)  11/24/20 126 lb 1.6 oz (57.2 kg)  10/29/20 125 lb 6.4 oz  (56.9 kg)    General appearance: alert, cooperative and appears stated age Ears: normal TM's and external ear canals both ears Throat: lips, mucosa, and tongue normal; teeth and gums normal Neck: no adenopathy, no carotid bruit, supple, symmetrical, trachea midline and thyroid not enlarged, symmetric, no tenderness/mass/nodules Back: symmetric, no curvature. ROM normal. No CVA tenderness. Lungs: clear to auscultation bilaterally Heart: regular rate and rhythm, S1, S2 normal, no murmur, click, rub or gallop Abdomen: soft, non-tender; bowel sounds normal; no masses,  no organomegaly Pulses: 2+ and symmetric MSK:  ROM full in left hip without pain.  Positive straight leg lift.  DTRS 2+ bilaterally in patellar region  .  NO FOOT DROP  Skin: Skin color, texture, turgor normal. No rashes or lesions Lymph nodes: Cervical, supraclavicular, and axillary nodes normal.  Lab Results  Component Value Date   HGBA1C 6.2 08/08/2020   HGBA1C 5.9 12/19/2019   HGBA1C 6.3 08/08/2019    Lab Results  Component Value Date   CREATININE 0.89 08/27/2020   CREATININE 1.14 08/08/2020   CREATININE 0.88 12/19/2019    Lab Results  Component Value Date   WBC 6.2 08/08/2020   HGB 12.6 08/08/2020   HCT 37.5 08/08/2020   PLT 232.0 08/08/2020   GLUCOSE 97 08/27/2020   CHOL 128 08/08/2020   TRIG 146.0 08/08/2020   HDL 45.90 08/08/2020   LDLDIRECT 75.0 01/06/2018   LDLCALC 53 08/08/2020   ALT 12 08/08/2020   AST 18 08/08/2020   NA 139 08/27/2020   K 4.6 08/27/2020   CL 103 08/27/2020   CREATININE 0.89 08/27/2020   BUN 15 08/27/2020   CO2 31 08/27/2020   TSH 1.03 08/08/2020   HGBA1C 6.2 08/08/2020   MICROALBUR <0.7 08/27/2020    NM Hepato W/EjeCT Fract  Result Date: 12/05/2020 CLINICAL DATA:  Abdominal pain, nausea, and vomiting for 6-7 years worse in past 2 months EXAM: NUCLEAR MEDICINE HEPATOBILIARY IMAGING WITH GALLBLADDER EF TECHNIQUE: Sequential images of the abdomen were obtained out to 60  minutes following intravenous administration of radiopharmaceutical. After oral ingestion of Ensure, gallbladder ejection fraction was determined. At 60 min, normal ejection fraction is greater than 33%. RADIOPHARMACEUTICALS:  5.19 mCi Tc-9m  Choletec IV COMPARISON:  05/29/2018 FINDINGS: Normal tracer extraction from bloodstream indicating normal hepatocellular function. Normal excretion of tracer into biliary tree. Gallbladder visualized at 16 min. Small bowel visualized at 53 min. No hepatic retention of tracer. Subjectively normal emptying of tracer from gallbladder following fatty meal stimulation. Calculated gallbladder ejection fraction is 88%, normal. Patient reported no symptoms following Ensure ingestion. Normal gallbladder ejection fraction following Ensure ingestion is greater than 33% at 1 hour. IMPRESSION: Normal exam, unchanged since 05/29/2018 Electronically Signed   By: Lavonia Dana M.D.   On: 12/05/2020 17:07    Assessment & Plan:   Problem List Items Addressed This Visit  Unprioritized   Dextroscoliosis of lumbosacral spine   Sciatica, left side    WITH dextroscoliosis and mild DDD changes noted on todays' lumbar spine films.  .She was advised to stop doing the exercises she saw on You tube and start the back extensions exercises demonstrated today.  Trial of prednisone taper and tylenol.  If no improvement, refer for PT        Other Visit Diagnoses    Sciatica of left side    -  Primary   Relevant Orders   DG Lumbar Spine Complete (Completed)   Ambulatory referral to Physical Therapy     I spent 30 minutes dedicated to the care of this patient on the date of this encounter to include pre-visit review of his medical history,  Face-to-face time with the patient , and post visit ordering of testing and therapeutics.  I am having Laporsche B. Alcivar start on predniSONE. I am also having her maintain her magnesium oxide, calcium carbonate, aspirin, nitroGLYCERIN, calcium  elemental as carbonate, clobetasol ointment, triamcinolone, esomeprazole, acebutolol, amLODipine, metoCLOPramide, losartan, and simvastatin.  Meds ordered this encounter  Medications  . predniSONE (DELTASONE) 10 MG tablet    Sig: 6 tablets on Day 1 , then reduce by 1 tablet daily until gone    Dispense:  21 tablet    Refill:  0    There are no discontinued medications.  Follow-up: No follow-ups on file.   Crecencio Mc, MD

## 2020-12-22 NOTE — Telephone Encounter (Signed)
err

## 2020-12-22 NOTE — Progress Notes (Unsigned)
Left sided leg and hip pain onset of a few weeks ago. No known injury or falls. Pain rated 6/10 and Patient thinks this may be sciatica pain.

## 2020-12-22 NOTE — Patient Instructions (Addendum)
I agree with you that the leg pain seesm to be coming from your lower back.  I am prescribing  Prednisone to manage the inflamed sciatic nerve   You can add up to 2000 mg of acetominophen (tylenol) every day safely  In divided doses  (650 mg every 8 hours )   Exercise: (supported back extension, standing)  Stand against a counter or sofa with your buttocks resting on the edge and your hands on the edge as well on either side of your back  Slowly lean backwards,  Bending from the waist, until you feel slight discomfort. Restore yourself to vertical position (up straight) Repeat the back extension 10 times , each time extending a little farther).  What should you expect? The pain should recede from the calf/thigh/buttocks but may localize to the lower back  If it does not,  Or if it makes the leg pain worse, STOP doing it.   If it results in improvement,  Repeat the exercise every 2-3 hours while awake and STOP THE OTHER EXERCISES that do the opposite motion (back flexion)  I will make the physical therapy referral in case your pain does not resolve after a week

## 2020-12-23 DIAGNOSIS — M4187 Other forms of scoliosis, lumbosacral region: Secondary | ICD-10-CM | POA: Insufficient documentation

## 2020-12-23 DIAGNOSIS — M5432 Sciatica, left side: Secondary | ICD-10-CM | POA: Insufficient documentation

## 2020-12-23 NOTE — Assessment & Plan Note (Signed)
WITH dextroscoliosis and mild DDD changes noted on todays' lumbar spine films.  .She was advised to stop doing the exercises she saw on You tube and start the back extensions exercises demonstrated today.  Trial of prednisone taper and tylenol.  If no improvement, refer for PT

## 2020-12-24 ENCOUNTER — Telehealth: Payer: Self-pay | Admitting: Internal Medicine

## 2020-12-24 NOTE — Telephone Encounter (Signed)
The dose is automatically lower each day,  So I would continue the taper

## 2020-12-24 NOTE — Telephone Encounter (Signed)
Spoke with pt and she stated that the vitals in the message below were not taken during an episode where she felt like her heart was racing. Pt stated that the only time she noticed it was this morning about 5am. She stated that she has palpitations sometimes any ways but this felt a little different "like a quiver". Pt stated she did not have any chest pain or SOBr. Pt has not taken any prednisone today. Does pt need to continue medication or maybe lower the dose?

## 2020-12-24 NOTE — Telephone Encounter (Signed)
Spoke with pt and informed her that Dr. Derrel Nip would like for her to continue taking the taper. Pt was advised that if she developed the racing heart, chest and SOBr then she needs to go to the ED. Pt gave a verbal understanding.

## 2020-12-24 NOTE — Progress Notes (Signed)
Aortic atherosclerosis noted on  lumbar spine films.  Continue simvastatin to lower risk of stroke and heart attack.  Scoliosis and moderate degenerative changes noted in the lumbar spine.  If she is not tolerating the prednisone and cannot continue it, let me know and I will refer  for PT

## 2020-12-24 NOTE — Telephone Encounter (Signed)
Pt saw Dr. Derrel Nip on 12/22/20 and was given predniSONE she said that she feels like her heart is racing at times her BP this morning was 124/62 73

## 2020-12-24 NOTE — Telephone Encounter (Signed)
Were the vital signs documented during a period when she felt the heart was racing?  Because if so,  Then she can continue the prednisone if she can tolerate the symptoms without chest pain or shortness of breath

## 2020-12-25 ENCOUNTER — Telehealth (INDEPENDENT_AMBULATORY_CARE_PROVIDER_SITE_OTHER): Payer: Medicare Other | Admitting: Gastroenterology

## 2020-12-25 DIAGNOSIS — R112 Nausea with vomiting, unspecified: Secondary | ICD-10-CM

## 2020-12-25 DIAGNOSIS — K3184 Gastroparesis: Secondary | ICD-10-CM

## 2020-12-25 DIAGNOSIS — R110 Nausea: Secondary | ICD-10-CM

## 2020-12-25 NOTE — Progress Notes (Signed)
Teresa Wade , MD [address]  [place]  [place],  [address]  Main: [phone]  Fax: [phone]   Primary Care Physician: Einar Pheasant, MD  Virtual Visit via Telephone Note  I connected with patient on 12/25/20 at  1:15 PM EST by telephone and verified that I am speaking with the correct person using two identifiers.   I discussed the limitations, risks, security and privacy concerns of performing an evaluation and management service by telephone and the availability of in person appointments. I also discussed with the patient that there may be a patient responsible charge related to this service. The patient expressed understanding and agreed to proceed.  Location of Patient: Home Location of Provider: Home Persons involved: Patient and provider only   History of Present Illness:   Follow up for nausea and abdominal pain   HPI: Teresa Wade is a 87 y.o. female    Summary of history : Initially referred and seen for nausea, vomiting and abdominal cramping  In 10/2020. Previously a [facility] patient.  She states that she has had issues with nausea and vomiting for many years.  Has not been given a clear diagnosis.  She says the vomiting and nausea is usually in the morning and she brings out hold undigested food that she had eaten previously.  Foods which are high in fat content make it worse.  Denies any headaches or imbalance.  Some weight loss.  Denies any diarrhea.  Has 1 bowel movement that is hard every other day.  Takes MiraLAX as needed.  Some abdominal discomfort.  Per last office visit in May 2020and last EGD was in 2013.  Colonoscopy in 2019 showed sigmoid diverticulosis and biopsy results of the colon suggestive of lymphocytic colitis.  Had a gastric emptying study back then which was normal was seen by [facility] for a second opinion and was scheduled for bacterial breath overgrowth breath test which was subsequently positive in November 2019 and  treated with clear Keflex and Flagyl at her last visit in 2020 was in budesonide 3 mg a day and also had completed a course of Xifaxan for 14 days.  Plan at her last visit was addressing her depression or anxiety.  She had requested follow-up with [facility] a DEXA scan was recommended at that time which she wanted to discuss with her doctor.   Interval history   10/29/2020-12/25/2020  She states that since her last visit she is doing much better.  She takes MiraLAX most days 1 packet a day and has a bowel movement some days more harder than the others.  Nausea  is much better.  She is taken a few doses of Reglan which mostly helped.  No other complaints.  Current Outpatient Medications  Medication Sig Dispense Refill  . acebutolol (SECTRAL) 200 MG capsule TAKE 2 CAPSULES BY MOUTH EVERY MORNING & 1 CAPSULE EVERY EVENING 270 capsule 2  . amLODipine (NORVASC) 5 MG tablet TAKE 1 TABLET BY MOUTH EVERY DAY 90 tablet 1  . aspirin 81 MG tablet Take 81 mg by mouth daily.    . calcium carbonate (OS-CAL) 1250 MG chewable tablet Chew 1 tablet by mouth 2 (two) times daily.    . calcium elemental as carbonate (BARIATRIC TUMS ULTRA) 400 MG chewable tablet Chew by mouth. (Patient not taking: Reported on 12/22/2020)    . clobetasol ointment (TEMOVATE) 8.41 % Apply 1 application topically 2 (two) times daily. (Patient not taking: Reported on 12/22/2020)    .  esomeprazole (NEXIUM) 40 MG capsule TAKE ONE CAPSULE BY Mouth daily. 90 capsule 1  . losartan (COZAAR) 100 MG tablet TAKE 1 TABLET BY MOUTH EVERY DAY 90 tablet 1  . magnesium oxide (MAG-OX) 400 MG tablet Take 400 mg by mouth daily.    . metoCLOPramide (REGLAN) 5 MG tablet Take 1 tablet (5 mg total) by mouth every 8 (eight) hours as needed for nausea or vomiting. (Patient not taking: Reported on 12/22/2020) 20 tablet 0  . nitroGLYCERIN (NITROSTAT) 0.4 MG SL tablet Place 1 tablet (0.4 mg total) under the tongue every 5 (five) minutes as needed for chest pain. May  repeat x 1.  If persistent pain, call 911 (Patient not taking: Reported on 12/22/2020) 25 tablet 0  . predniSONE (DELTASONE) 10 MG tablet 6 tablets on Day 1 , then reduce by 1 tablet daily until gone 21 tablet 0  . simvastatin (ZOCOR) 40 MG tablet TAKE 1 TABLET BY MOUTH EVERYDAY AT BEDTIME 90 tablet 1  . triamcinolone cream (KENALOG) 0.1 % Apply 1 application topically 2 (two) times daily. (Patient not taking: Reported on 12/22/2020) 45 g 0   No current facility-administered medications for this visit.    Allergies as of 12/25/2020 - Review Complete 12/22/2020  Allergen Reaction Noted  . Amoxicillin-pot clavulanate Other (See Comments) 12/25/2018  . Clarithromycin  11/08/2012  . Lodine [etodolac] Diarrhea 11/07/2012  . Minocycline Other (See Comments) 11/08/2012  . Naproxen sodium  06/22/2010  . Rofecoxib  06/22/2010  . Vibramycin [doxycycline calcium] Other (See Comments) 11/07/2012  . Mobic [meloxicam] Rash 11/07/2012    Review of Systems:    All systems reviewed and negative except where noted in HPI.   Observations/Objective:  Labs: CMP     Component Value Date/Time   NA 139 08/27/2020 0936   NA 139 11/16/2012 2333   K 4.6 08/27/2020 0936   K 4.4 11/16/2012 2333   CL 103 08/27/2020 0936   CL 107 11/16/2012 2333   CO2 31 08/27/2020 0936   CO2 25 11/16/2012 2333   GLUCOSE 97 08/27/2020 0936   GLUCOSE 86 11/16/2012 2333   BUN 15 08/27/2020 0936   BUN 25 (H) 11/16/2012 2333   CREATININE 0.89 08/27/2020 0936   CREATININE 1.05 11/16/2012 2333   CALCIUM 9.2 08/27/2020 0936   CALCIUM 8.5 11/16/2012 2333   PROT 6.7 08/08/2020 1152   PROT 7.1 11/16/2012 2333   ALBUMIN 4.2 08/08/2020 1152   ALBUMIN 3.7 11/16/2012 2333   AST 18 08/08/2020 1152   AST 29 11/16/2012 2333   ALT 12 08/08/2020 1152   ALT 23 11/16/2012 2333   ALKPHOS 78 08/08/2020 1152   ALKPHOS 109 11/16/2012 2333   BILITOT 0.7 08/08/2020 1152   BILITOT 0.4 11/16/2012 2333   GFRNONAA 49 (L) 12/23/2016 1835    GFRNONAA 51 (L) 11/16/2012 2333   GFRAA 57 (L) 12/23/2016 1835   GFRAA 59 (L) 11/16/2012 2333   Lab Results  Component Value Date   WBC 6.2 08/08/2020   HGB 12.6 08/08/2020   HCT 37.5 08/08/2020   MCV 92.1 08/08/2020   PLT 232.0 08/08/2020    Imaging Studies: DG Lumbar Spine Complete  Result Date: 12/22/2020 CLINICAL DATA:  Low back pain EXAM: LUMBAR SPINE - COMPLETE 4+ VIEW COMPARISON:  None. FINDINGS: Mild dextroscoliosis. Sagittal alignment within normal limits. Vertebral body heights are maintained. Moderate degenerative changes at L2-L3. Mild degenerative changes elsewhere in the lumbar spine. Aortic atherosclerosis. IMPRESSION: Scoliosis with degenerative changes, most notable at L2-L3. No acute  osseous abnormality. Electronically Signed   By: Donavan Foil M.D.   On: 12/22/2020 21:36   NM Hepato W/EjeCT Fract  Result Date: 12/05/2020 CLINICAL DATA:  Abdominal pain, nausea, and vomiting for 6-7 years worse in past 2 months EXAM: NUCLEAR MEDICINE HEPATOBILIARY IMAGING WITH GALLBLADDER EF TECHNIQUE: Sequential images of the abdomen were obtained out to 60 minutes following intravenous administration of radiopharmaceutical. After oral ingestion of Ensure, gallbladder ejection fraction was determined. At 60 min, normal ejection fraction is greater than 33%. RADIOPHARMACEUTICALS:  5.19 mCi Tc-42m  Choletec IV COMPARISON:  05/29/2018 FINDINGS: Normal tracer extraction from bloodstream indicating normal hepatocellular function. Normal excretion of tracer into biliary tree. Gallbladder visualized at 16 min. Small bowel visualized at 53 min. No hepatic retention of tracer. Subjectively normal emptying of tracer from gallbladder following fatty meal stimulation. Calculated gallbladder ejection fraction is 88%, normal. Patient reported no symptoms following Ensure ingestion. Normal gallbladder ejection fraction following Ensure ingestion is greater than 33% at 1 hour. IMPRESSION: Normal exam, unchanged  since 05/29/2018 Electronically Signed   By: Lavonia Dana M.D.   On: 12/05/2020 17:07    Assessment and Plan:   Teresa Wade is a 87 y.o. y/o female with a prior history of  Extensive evaluations/treatments   by [facility] and [facility]  for lymphocytic colitis, bacterial overgrowth syndrome, has had a negative gastric emptying study.  She has a chronic history of nausea vomiting abdominal discomfort.  Functional disorders has been entertained as well in the past.  She is here to see me as a third gastroenterologist.  Reviewing her records, history her symptoms are suggestive of gastroparesis.  I do note that her gastric emptying study is normal.  The study can be normal if done during the.  When she has no gastroparesis.  The gastroparesis could be episodic.  She is diabetic but not on any medication.  The fact that she brings up undigested food from her previous meal does indicate that the food is not getting digested and going downstream adequately.  She also has some constipation.    Plan 1.  Continue on MiraLAX 1 packet daily.  We have discussed about increasing the dose as needed. 2.    To new gastroparesis diet.  Take as needed Reglan as needed 5 mg.  Previously dispense 15 tablets.  She has 10 remaining.  She will call me back if symptoms recur.  Overall doing much better. 3.  If symptoms worsen in the future may need to consider imaging and endoscopy.    I discussed the assessment and treatment plan with the patient. The patient was provided an opportunity to ask questions and all were answered. The patient agreed with the plan and demonstrated an understanding of the instructions.   The patient was advised to call back or seek an in-person evaluation if the symptoms worsen or if the condition fails to improve as anticipated.  I provided 12 minutes of non-face-to-face time during this encounter.  Dr Teresa Bellows MD,MRCP [facility]) Gastroenterology/Hepatology Pager:  [phone]   Speech recognition software was used to dictate this note.

## 2020-12-26 ENCOUNTER — Other Ambulatory Visit: Payer: Self-pay

## 2020-12-26 ENCOUNTER — Ambulatory Visit
Admission: RE | Admit: 2020-12-26 | Discharge: 2020-12-26 | Disposition: A | Discharge status: Home / Self Care | Payer: Medicare Other | Source: Ambulatory Visit | Attending: Physician Assistant | Admitting: Physician Assistant

## 2020-12-26 DIAGNOSIS — H9202 Otalgia, left ear: Secondary | ICD-10-CM | POA: Diagnosis not present

## 2020-12-26 LAB — POCT I-STAT CREATININE: Creatinine, Ser: 0.9 mg/dL (ref 0.44–1.00)

## 2020-12-26 MED ORDER — IOHEXOL 300 MG/ML  SOLN
75.0000 mL | Freq: Once | INTRAMUSCULAR | Status: AC | PRN
  Administered 2020-12-26: 75 mL via INTRAVENOUS

## 2020-12-29 ENCOUNTER — Other Ambulatory Visit: Payer: Self-pay | Admitting: Physician Assistant

## 2020-12-29 DIAGNOSIS — E041 Nontoxic single thyroid nodule: Secondary | ICD-10-CM

## 2021-01-05 ENCOUNTER — Ambulatory Visit
Admission: RE | Admit: 2021-01-05 | Discharge: 2021-01-05 | Disposition: A | Discharge status: Home / Self Care | Payer: Medicare Other | Source: Ambulatory Visit | Attending: Physician Assistant | Admitting: Physician Assistant

## 2021-01-05 ENCOUNTER — Other Ambulatory Visit: Payer: Self-pay

## 2021-01-05 ENCOUNTER — Telehealth: Payer: Self-pay

## 2021-01-05 DIAGNOSIS — E041 Nontoxic single thyroid nodule: Secondary | ICD-10-CM | POA: Insufficient documentation

## 2021-01-05 NOTE — Telephone Encounter (Signed)
The referral for physical therapy has been placed.  I will send a message to our referral coordinator regarding getting this scheduled.  If she is having acute worsening pain, can be seen at [facility] as a walk in - I think from 1-4 daily.  Let me know if she needs anything more from me.

## 2021-01-05 NOTE — Telephone Encounter (Signed)
Pt called and wanted Dr Derrel Nip to know that her sciatica pain is back in her left leg. She states that no one has called her about physical therapy. She states that she can not walk without her husband's walker and needs to know what to do next. Pt did not schedule appt.

## 2021-01-05 NOTE — Telephone Encounter (Signed)
Patient aware of below and info given for [facility].

## 2021-01-06 ENCOUNTER — Other Ambulatory Visit: Payer: Self-pay | Admitting: Otolaryngology

## 2021-01-06 DIAGNOSIS — E041 Nontoxic single thyroid nodule: Secondary | ICD-10-CM

## 2021-01-07 ENCOUNTER — Other Ambulatory Visit: Payer: Self-pay | Admitting: Otolaryngology

## 2021-01-07 DIAGNOSIS — E041 Nontoxic single thyroid nodule: Secondary | ICD-10-CM

## 2021-01-12 ENCOUNTER — Ambulatory Visit: Payer: Medicare Other | Admitting: Dietician

## 2021-01-14 ENCOUNTER — Ambulatory Visit
Admission: RE | Admit: 2021-01-14 | Discharge: 2021-01-14 | Disposition: A | Discharge status: Home / Self Care | Payer: Medicare Other | Source: Ambulatory Visit | Attending: Otolaryngology | Admitting: Otolaryngology

## 2021-01-14 ENCOUNTER — Other Ambulatory Visit: Payer: Self-pay

## 2021-01-14 DIAGNOSIS — E042 Nontoxic multinodular goiter: Secondary | ICD-10-CM | POA: Diagnosis not present

## 2021-01-14 DIAGNOSIS — E041 Nontoxic single thyroid nodule: Secondary | ICD-10-CM

## 2021-01-14 NOTE — Discharge Instructions (Signed)
Thyroid Needle Biopsy, Care After This sheet gives you information about how to care for yourself after your procedure. Your health care provider may also give you more specific instructions. If you have problems or questions, contact your health care provider. What can I expect after the procedure? After the procedure, it is common to have:  Soreness and tenderness that lasts for a few days.  Bruising where the needle was inserted (puncture site). Follow these instructions at home:  Take over-the-counter and prescription medicines only as told by your health care provider.  To help ease discomfort, keep your head raised (elevated) when you are lying down. When you move from lying down to sitting up, use both hands to support the back of your head and neck.  Check your puncture site every day for signs of infection. Check for: ? Redness, swelling, or pain. ? Fluid or blood. ? Warmth. ? Pus or a bad smell.  Return to your normal activities as told by your health care provider. Ask your health care provider what activities are safe for you.  Keep all follow-up visits as told by your health care provider. This is important.   Contact a health care provider if:  You have redness, swelling, or pain around your puncture site.  You have fluid or blood coming from your puncture site.  Your puncture site feels warm to the touch.  You have pus or a bad smell coming from your puncture site.  You have a fever. Get help right away if:  You have severe bleeding from the puncture site.  You have difficulty swallowing.  You have swollen glands (lymph nodes) in your neck. Summary  It is common to have some bruising and soreness where the needle was inserted in your lower front neck area (puncture site).  Check your puncture site every day for signs of infection, such as redness, swelling, or pain.  Get help right away if you have severe bleeding from your puncture site. This  information is not intended to replace advice given to you by your health care provider. Make sure you discuss any questions you have with your health care provider. Document Revised: 07/24/2020 Document Reviewed: 07/24/2020 Elsevier Patient Education  2021 [employer]  

## 2021-01-16 ENCOUNTER — Other Ambulatory Visit (INDEPENDENT_AMBULATORY_CARE_PROVIDER_SITE_OTHER): Payer: Medicare Other

## 2021-01-16 ENCOUNTER — Other Ambulatory Visit: Payer: Self-pay

## 2021-01-16 DIAGNOSIS — E1165 Type 2 diabetes mellitus with hyperglycemia: Secondary | ICD-10-CM

## 2021-01-16 DIAGNOSIS — E7800 Pure hypercholesterolemia, unspecified: Secondary | ICD-10-CM

## 2021-01-16 LAB — HEPATIC FUNCTION PANEL
ALT: 12 U/L (ref 0–35)
AST: 15 U/L (ref 0–37)
Albumin: 3.8 g/dL (ref 3.5–5.2)
Alkaline Phosphatase: 79 U/L (ref 39–117)
Bilirubin, Direct: 0.1 mg/dL (ref 0.0–0.3)
Total Bilirubin: 0.6 mg/dL (ref 0.2–1.2)
Total Protein: 6.3 g/dL (ref 6.0–8.3)

## 2021-01-16 LAB — BASIC METABOLIC PANEL
BUN: 17 mg/dL (ref 6–23)
CO2: 32 mEq/L (ref 19–32)
Calcium: 9.3 mg/dL (ref 8.4–10.5)
Chloride: 102 mEq/L (ref 96–112)
Creatinine, Ser: 0.96 mg/dL (ref 0.40–1.20)
GFR: 53.37 mL/min — ABNORMAL LOW (ref >60.00)
Glucose, Bld: 106 mg/dL — ABNORMAL HIGH (ref 70–99)
Potassium: 4.6 mEq/L (ref 3.5–5.1)
Sodium: 139 mEq/L (ref 135–145)

## 2021-01-16 LAB — LIPID PANEL
Cholesterol: 129 mg/dL (ref 0–200)
HDL: 43.5 mg/dL (ref >39.00)
LDL Cholesterol: 56 mg/dL (ref 0–99)
NonHDL: 85.12
Total CHOL/HDL Ratio: 3
Triglycerides: 148 mg/dL (ref 0.0–149.0)
VLDL: 29.6 mg/dL (ref 0.0–40.0)

## 2021-01-16 LAB — HEMOGLOBIN A1C: Hgb A1c MFr Bld: 6.1 % (ref 4.6–6.5)

## 2021-01-19 ENCOUNTER — Encounter: Payer: Self-pay | Admitting: Dietician

## 2021-01-19 NOTE — Progress Notes (Signed)
Patient cancelled her [facility] follow-up appointment for 01/12/21, and did not wish to reschedule as she is doing well without further RD assistance. Sent notification to referring provider.

## 2021-01-20 ENCOUNTER — Other Ambulatory Visit: Payer: Self-pay

## 2021-01-20 ENCOUNTER — Ambulatory Visit (INDEPENDENT_AMBULATORY_CARE_PROVIDER_SITE_OTHER): Payer: Medicare Other | Admitting: Internal Medicine

## 2021-01-20 ENCOUNTER — Encounter: Payer: Self-pay | Admitting: Internal Medicine

## 2021-01-20 VITALS — BP 118/52 | Temp 97.7°F | Ht 60.0 in | Wt 120.0 lb

## 2021-01-20 DIAGNOSIS — K52832 Lymphocytic colitis: Secondary | ICD-10-CM

## 2021-01-20 DIAGNOSIS — K219 Gastro-esophageal reflux disease without esophagitis: Secondary | ICD-10-CM

## 2021-01-20 DIAGNOSIS — E7800 Pure hypercholesterolemia, unspecified: Secondary | ICD-10-CM | POA: Diagnosis not present

## 2021-01-20 DIAGNOSIS — Z0000 Encounter for general adult medical examination without abnormal findings: Secondary | ICD-10-CM | POA: Diagnosis not present

## 2021-01-20 DIAGNOSIS — E1165 Type 2 diabetes mellitus with hyperglycemia: Secondary | ICD-10-CM

## 2021-01-20 DIAGNOSIS — E041 Nontoxic single thyroid nodule: Secondary | ICD-10-CM

## 2021-01-20 DIAGNOSIS — I10 Essential (primary) hypertension: Secondary | ICD-10-CM

## 2021-01-20 DIAGNOSIS — M25562 Pain in left knee: Secondary | ICD-10-CM

## 2021-01-20 NOTE — Progress Notes (Signed)
Patient ID: Teresa Wade, female   DOB: 10-29-1932, 87 y.o.   MRN: [id number]   Subjective:    Patient ID: Teresa Wade, female    DOB: [birth year], 87 y.o.   MRN: [id number]  HPI This visit occurred during the SARS-CoV-2 public health emergency.  Safety protocols were in place, including screening questions prior to the visit, additional usage of staff PPE, and extensive cleaning of exam room while observing appropriate contact time as indicated for disinfecting solutions.  Patient here for her physical exam.  Has been evaluated for persistent intermittent nausea, vomiting and abdominal cramping.  Colonoscopy 2019 - diverticulosis and biopsy results suggestive of lymphocytic colitis.  Previous gastric emptying study - reported as normal.  Breath test - positive - treated with keflex and flagyl.  Also has been treated with budesonide and has completed a course of xifaxan.  Saw Dr Vicente Males in follow up 12/25/20.  Was instructed to continue miralax. Take reglan as needed.  She saw a dietician.  Has adjusted her diet.  States she feels much better.  Episodes not as often.  Has not had one on the last several weeks.  No chest pain or sob reported.  Recently evaluated by ENT.  Is s/u thyroid biopsy.  Also having some issues with her left knee.  Wearing a knee brace.  Using her husband's walker.  Helped.    Past Medical History:  Diagnosis Date  . Abnormal chest CT    right hilar cyst  . Anxiety   . Fibrocystic disease of breast   . GERD (gastroesophageal reflux disease)   . History of thrombocytopenia   . Hypercholesterolemia   . Hyperglycemia   . Hypertension   . Osteoarthritis   . Osteoporosis    s/p Actonel, Reclast (last 2011)  . Palpitations   . Pre-diabetes   . Psoriasis   . Reflux esophagitis    Past Surgical History:  Procedure Laterality Date  . ABDOMINAL HYSTERECTOMY  1980  . BREAST EXCISIONAL BIOPSY Left 1976   benign  . COLONOSCOPY WITH PROPOFOL N/A 05/05/2018   Procedure:  COLONOSCOPY WITH PROPOFOL;  Surgeon: Manya Silvas, MD;  Location[facility] ENDOSCOPY;  Service: Endoscopy;  Laterality: N/A;  . ESOPHAGOGASTRODUODENOSCOPY (EGD) WITH PROPOFOL N/A 05/05/2018   Procedure: ESOPHAGOGASTRODUODENOSCOPY (EGD) WITH PROPOFOL;  Surgeon: Manya Silvas, MD;  Location: [facility] ENDOSCOPY;  Service: Endoscopy;  Laterality: N/A;  . THUMB AMPUTATION Left 2015   Family History  Problem Relation Age of Onset  . Heart disease Mother   . Hypertension Mother   . Mental illness Other        sibling, suicide  . Colon cancer Sister   . Sleep apnea Brother   . Breast cancer Neg Hx    Social History   Socioeconomic History  . Marital status: Widowed    Spouse name: Not on file  . Number of children: 1  . Years of education: Not on file  . Highest education level: Not on file  Occupational History  . Not on file  Tobacco Use  . Smoking status: Former Smoker    Quit date: 1999    Years since quitting: 23.1  . Smokeless tobacco: Never Used  Vaping Use  . Vaping Use: Never used  Substance and Sexual Activity  . Alcohol use: No    Alcohol/week: 0.0 standard drinks  . Drug use: No  . Sexual activity: Never  Other Topics Concern  . Not on file  Social History Narrative  .  Not on file   Social Determinants of Health   Financial Resource Strain: Not on file  Food Insecurity: Not on file  Transportation Needs: Not on file  Physical Activity: Not on file  Stress: Not on file  Social Connections: Not on file    Outpatient Encounter Medications as of 01/20/2021  Medication Sig  . acebutolol (SECTRAL) 200 MG capsule TAKE 2 CAPSULES BY MOUTH EVERY MORNING & 1 CAPSULE EVERY EVENING  . amLODipine (NORVASC) 5 MG tablet TAKE 1 TABLET BY MOUTH EVERY DAY  . aspirin 81 MG tablet Take 81 mg by mouth daily.  . calcium elemental as carbonate (BARIATRIC TUMS ULTRA) 400 MG chewable tablet Chew by mouth.  . clobetasol ointment (TEMOVATE) 3.30 % Apply 1 application topically 2  (two) times daily.  Marland Kitchen esomeprazole (NEXIUM) 40 MG capsule TAKE ONE CAPSULE BY Mouth daily.  Marland Kitchen losartan (COZAAR) 100 MG tablet TAKE 1 TABLET BY MOUTH EVERY DAY  . magnesium oxide (MAG-OX) 400 MG tablet Take 400 mg by mouth daily.  . metoCLOPramide (REGLAN) 5 MG tablet Take 1 tablet (5 mg total) by mouth every 8 (eight) hours as needed for nausea or vomiting.  . nitroGLYCERIN (NITROSTAT) 0.4 MG SL tablet Place 1 tablet (0.4 mg total) under the tongue every 5 (five) minutes as needed for chest pain. May repeat x 1.  If persistent pain, call 911 (Patient taking differently: Place 0.4 mg under the tongue every 5 (five) minutes as needed for chest pain. May repeat x 1.  If persistent pain, call 911)  . simvastatin (ZOCOR) 40 MG tablet TAKE 1 TABLET BY MOUTH EVERYDAY AT BEDTIME  . triamcinolone cream (KENALOG) 0.1 % Apply 1 application topically 2 (two) times daily.  . calcium carbonate (OS-CAL) 1250 MG chewable tablet Chew 1 tablet by mouth 2 (two) times daily. (Patient not taking: Reported on 01/20/2021)  . predniSONE (DELTASONE) 10 MG tablet 6 tablets on Day 1 , then reduce by 1 tablet daily until gone (Patient not taking: Reported on 01/20/2021)   No facility-administered encounter medications on file as of 01/20/2021.   Review of Systems  Constitutional: Negative for appetite change and unexpected weight change.  HENT: Negative for congestion, sinus pressure and sore throat.   Eyes: Negative for pain and visual disturbance.  Respiratory: Negative for cough, chest tightness and shortness of breath.   Cardiovascular: Negative for chest pain, palpitations and leg swelling.  Gastrointestinal: Negative for abdominal pain, diarrhea, nausea and vomiting.       GI symptoms improved.   Genitourinary: Negative for difficulty urinating and dysuria.  Musculoskeletal: Negative for myalgias.       Knee pain as outlined.   Skin: Negative for color change and rash.  Neurological: Negative for dizziness,  light-headedness and headaches.  Hematological: Negative for adenopathy. Does not bruise/bleed easily.  Psychiatric/Behavioral: Negative for agitation and dysphoric mood.       Objective:    Physical Exam Vitals reviewed.  Constitutional:      General: She is not in acute distress.    Appearance: Normal appearance. She is well-developed and well-nourished.  HENT:     Head: Normocephalic and atraumatic.     Right Ear: External ear normal.     Left Ear: External ear normal.     Mouth/Throat:     Mouth: Oropharynx is clear and moist.  Eyes:     General: No scleral icterus.       Right eye: No discharge.  Left eye: No discharge.     Conjunctiva/sclera: Conjunctivae normal.  Neck:     Thyroid: No thyromegaly.  Cardiovascular:     Rate and Rhythm: Normal rate and regular rhythm.  Pulmonary:     Effort: No tachypnea, accessory muscle usage or respiratory distress.     Breath sounds: Normal breath sounds. No decreased breath sounds or wheezing.  Chest:  Breasts:     Right: No inverted nipple, mass, nipple discharge or tenderness (no axillary adenopathy).     Left: No inverted nipple, mass, nipple discharge or tenderness (no axilarry adenopathy).    Abdominal:     General: Bowel sounds are normal.     Palpations: Abdomen is soft.     Tenderness: There is no abdominal tenderness.  Musculoskeletal:        General: No swelling, tenderness or edema.     Cervical back: Neck supple. No tenderness.  Lymphadenopathy:     Cervical: No cervical adenopathy.  Skin:    Findings: No erythema or rash.  Neurological:     Mental Status: She is alert and oriented to person, place, and time.  Psychiatric:        Mood and Affect: Mood and affect and mood normal.        Behavior: Behavior normal.     BP (!) 118/52   Temp 97.7 F (36.5 C)   Ht 5' (1.524 m)   Wt 120 lb (54.4 kg)   LMP 11/09/1979   BMI 23.44 kg/m  Wt Readings from Last 3 Encounters:  01/20/21 120 lb (54.4 kg)   12/22/20 123 lb 3.2 oz (55.9 kg)  11/24/20 126 lb 1.6 oz (57.2 kg)     Lab Results  Component Value Date   WBC 6.2 08/08/2020   HGB 12.6 08/08/2020   HCT 37.5 08/08/2020   PLT 232.0 08/08/2020   GLUCOSE 106 (H) 01/16/2021   CHOL 129 01/16/2021   TRIG 148.0 01/16/2021   HDL 43.50 01/16/2021   LDLDIRECT 75.0 01/06/2018   LDLCALC 56 01/16/2021   ALT 12 01/16/2021   AST 15 01/16/2021   NA 139 01/16/2021   K 4.6 01/16/2021   CL 102 01/16/2021   CREATININE 0.96 01/16/2021   BUN 17 01/16/2021   CO2 32 01/16/2021   TSH 1.03 08/08/2020   HGBA1C 6.1 01/16/2021   MICROALBUR <0.7 08/27/2020    [place] FNA BX THYROID 1ST LESION AFIRMA  Result Date: 01/14/2021 INDICATION: Indeterminate thyroid nodule. EXAM: ULTRASOUND GUIDED FINE NEEDLE ASPIRATION OF INDETERMINATE THYROID NODULE COMPARISON:  01/05/2021 MEDICATIONS: None COMPLICATIONS: None immediate. TECHNIQUE: Informed written consent was obtained from the patient after a discussion of the risks, benefits and alternatives to treatment. Questions regarding the procedure were encouraged and answered. A timeout was performed prior to the initiation of the procedure. Pre-procedural ultrasound scanning demonstrated unchanged size and appearance of the indeterminate nodule within the left inferior thyroid lobe. The procedure was planned. The neck was prepped in the usual sterile fashion, and a sterile drape was applied covering the operative field. A timeout was performed prior to the initiation of the procedure. Local anesthesia was provided with 1% lidocaine. Under direct ultrasound guidance, 5 FNA biopsies were performed with 25 gauge needles. Two of the specimens were collected for ThyroSeq. Multiple ultrasound images were saved for procedural documentation purposes. The samples were prepared and submitted to pathology. Limited post procedural scanning was negative for hematoma or additional complication. Dressings were placed. The patient tolerated  the above procedures procedure well without  immediate postprocedural complication. FINDINGS: Nodule reference number based on prior diagnostic ultrasound: 3 Maximum size: 2.4 cm Location: Left; Inferior ACR TI-RADS risk category: TR5 (>/= 7 points) Reason for biopsy: meets ACR TI-RADS criteria Ultrasound imaging confirms appropriate placement of the needles within the thyroid nodule. IMPRESSION: Technically successful ultrasound guided fine needle aspiration of left inferior thyroid nodule. Electronically Signed   By: Markus Daft M.D.   On: 01/14/2021 14:41       Assessment & Plan:   Problem List Items Addressed This Visit    Diabetes ([place])    Low carb diet and exercise.  Follow met b and a1c.       Relevant Orders   Hemoglobin A1c   Essential hypertension    Blood pressure has been doing well on losartan and amlodipine.  Follow pressures.  Follow metabolic panel.       Relevant Orders   Basic metabolic panel   GERD (gastroesophageal reflux disease)    No upper symptoms currently.  Continue nexium.       Health care maintenance    Physical 01/20/21.  Mammogram 10/03/20 - Birads I.  Colonoscopy 04/2018 - internal hemorrhods, civerticulosis.        Hypercholesterolemia    On simvastatin.  Low cholesterol diet and exercise.  Follow lipid panel and liver function tests.        Relevant Orders   Hepatic function panel   Lipid panel   Knee pain, left    Wearing a brace.  Using a walker previously.  Helped.  Notify me if desires any further intervention.       Lymphocytic colitis    Found on pathology - colonoscopy.  Was on budesonide.  Saw GI.  Just had f/u with Dr Vicente Males.  Taking miralax.  Has reglan if needed.  Has adjusted her diet.  Symptoms improved.  Follow.        Thyroid nodule    S/p biopsy.  Obtain ENT records regarding follow up.       Relevant Orders   TSH   T4, free    Other Visit Diagnoses    Routine general medical examination at a health care facility    -   Primary       Einar Pheasant, MD

## 2021-01-20 NOTE — Assessment & Plan Note (Signed)
Physical 01/20/21.  Mammogram 10/03/20 - Birads I.  Colonoscopy 04/2018 - internal hemorrhods, civerticulosis.

## 2021-01-25 ENCOUNTER — Telehealth: Payer: Self-pay | Admitting: Internal Medicine

## 2021-01-25 ENCOUNTER — Encounter: Payer: Self-pay | Admitting: Internal Medicine

## 2021-01-25 DIAGNOSIS — E041 Nontoxic single thyroid nodule: Secondary | ICD-10-CM | POA: Insufficient documentation

## 2021-01-25 DIAGNOSIS — M25562 Pain in left knee: Secondary | ICD-10-CM | POA: Insufficient documentation

## 2021-01-25 NOTE — Assessment & Plan Note (Signed)
Low carb diet and exercise.  Follow met b and a1c.  

## 2021-01-25 NOTE — Assessment & Plan Note (Signed)
No upper symptoms currently.  Continue nexium.

## 2021-01-25 NOTE — Assessment & Plan Note (Signed)
Blood pressure has been doing well on losartan and amlodipine.  Follow pressures.  Follow metabolic panel.

## 2021-01-25 NOTE — Telephone Encounter (Signed)
Saw ENT recently.  ([facility]).  They found thyroid nodule in w/up. Sent for biopsy.  Need ENT records and question of need for f/u regarding thyroid biopsy.

## 2021-01-25 NOTE — Assessment & Plan Note (Signed)
S/p biopsy.  Obtain ENT records regarding follow up.

## 2021-01-25 NOTE — Assessment & Plan Note (Signed)
Found on pathology - colonoscopy.  Was on budesonide.  Saw GI.  Just had f/u with Dr Vicente Males.  Taking miralax.  Has reglan if needed.  Has adjusted her diet.  Symptoms improved.  Follow.

## 2021-01-25 NOTE — Assessment & Plan Note (Signed)
On simvastatin.  Low cholesterol diet and exercise.  Follow lipid panel and liver function tests.   

## 2021-01-25 NOTE — Assessment & Plan Note (Signed)
Wearing a brace.  Using a walker previously.  Helped.  Notify me if desires any further intervention.

## 2021-01-26 NOTE — Telephone Encounter (Signed)
Request send to [facility] ENT

## 2021-01-30 ENCOUNTER — Encounter: Payer: Self-pay | Admitting: Otolaryngology

## 2021-01-30 LAB — CYTOLOGY - NON PAP

## 2021-02-01 ENCOUNTER — Other Ambulatory Visit: Payer: Self-pay | Admitting: Internal Medicine

## 2021-03-09 ENCOUNTER — Telehealth: Payer: Self-pay

## 2021-03-09 DIAGNOSIS — E041 Nontoxic single thyroid nodule: Secondary | ICD-10-CM

## 2021-03-09 NOTE — Telephone Encounter (Signed)
Pt called and states that she needs a referral for an endocrinologist. She states that Dr Pryor Ochoa sent her to [facility] but it would be 2 months before she can get an appt there. She states that she is still losing weight. She now weighs 111. She does not care if she needs to go to [place] or [facility] just wants help quicker than in two months. Please advise

## 2021-03-10 ENCOUNTER — Other Ambulatory Visit: Payer: Self-pay | Admitting: Internal Medicine

## 2021-03-10 NOTE — Telephone Encounter (Signed)
I am ok to refer her, but need to confirm - is the referral for her thyroid?  Need records from ENT.  Also, if persistent weight loss, needs to be reevaluated here as well.

## 2021-03-10 NOTE — Telephone Encounter (Signed)
Can we refer her to another endocrinologist

## 2021-03-11 NOTE — Telephone Encounter (Signed)
Referral is for her thyroid. I have requested records from ENT. Patient is still having the persistent weight loss. I have scheduled her an appointment with you next week. She is ok with you placing referral prior to this appointment or waiting until she sees you. Whichever you prefer.

## 2021-03-11 NOTE — Addendum Note (Signed)
Addended by: Alisa Graff on: 03/11/2021 01:58 PM   Modules accepted: Orders

## 2021-03-11 NOTE — Telephone Encounter (Signed)
Order placed for GI referral.   

## 2021-03-11 NOTE — Telephone Encounter (Signed)
Order placed for endocrinology referral.  

## 2021-03-18 ENCOUNTER — Telehealth (INDEPENDENT_AMBULATORY_CARE_PROVIDER_SITE_OTHER): Payer: Medicare Other | Admitting: Internal Medicine

## 2021-03-18 DIAGNOSIS — R109 Unspecified abdominal pain: Secondary | ICD-10-CM

## 2021-03-18 DIAGNOSIS — I6529 Occlusion and stenosis of unspecified carotid artery: Secondary | ICD-10-CM

## 2021-03-18 DIAGNOSIS — K52832 Lymphocytic colitis: Secondary | ICD-10-CM

## 2021-03-18 DIAGNOSIS — E041 Nontoxic single thyroid nodule: Secondary | ICD-10-CM | POA: Diagnosis not present

## 2021-03-18 DIAGNOSIS — E7800 Pure hypercholesterolemia, unspecified: Secondary | ICD-10-CM

## 2021-03-18 DIAGNOSIS — K219 Gastro-esophageal reflux disease without esophagitis: Secondary | ICD-10-CM

## 2021-03-18 DIAGNOSIS — R110 Nausea: Secondary | ICD-10-CM

## 2021-03-18 DIAGNOSIS — I10 Essential (primary) hypertension: Secondary | ICD-10-CM

## 2021-03-18 DIAGNOSIS — R634 Abnormal weight loss: Secondary | ICD-10-CM

## 2021-03-18 DIAGNOSIS — E1165 Type 2 diabetes mellitus with hyperglycemia: Secondary | ICD-10-CM

## 2021-03-18 NOTE — Progress Notes (Signed)
Patient ID: Teresa Wade, female   DOB: 04-25-1932, 87 y.o.   MRN: [id number]   Virtual Visit via telephone Note  This visit type was conducted due to national recommendations for restrictions regarding the COVID-19 pandemic (e.g. social distancing).  This format is felt to be most appropriate for this patient at this time.  All issues noted in this document were discussed and addressed.  No physical exam was performed (except for noted visual exam findings with Video Visits).   I connected with Ayrabella Nop by telephone and verified that I am speaking with the correct person using two identifiers. Location patient: home Location provider: home office Persons participating in the virtual visit: patient, provider  The limitations, risks, security and privacy concerns of performing an evaluation and management service by telephone and the availability of in person appointments have been discussed.  It has also been discussed with the patient that there may be a patient responsible charge related to this service. The patient expressed understanding and agreed to proceed.   Reason for visit: work in appt.   HPI: Work in appt to discuss weight loss.  She has been having persistent intermittent nausea, vomiting and abdominal cramping.  Colonoscopy 2019 - diverticulosis and biopsy results suggestive of lymphocytic colitis.  Previous gastric emptying study reported as normal.  Breath test positive - treated with keflex and flagyl.  als has been treated with budesonide and completed a course of xifaxan.  Has seen Dr Vicente Males in follow up 12/25/20.  Was instructed to continue miralax, take reglan as needed.  She saw a dietician and has adjusted her diet.  She reports persistent intermittent nausea and vomiting.  Taking reglan prn.  Not eating fiber.  Having bowel movements daily.   Has started probiotics the last few days.  Continues to lose weight.  Weight is down to 113 pounds. States nausea and vomiting  may occur 2-3 hours after eating.  Still unclear etiology.  She also recently saw cardiology in f/u (03/16/21).  Holter - PACs/PVCs.  No further w/up warranted. Also has seen ENT for f/u thyroid nodule.  S/p biopsy.  Biopsy - atypia (undetermined sig).  ENT had requested referral to endocrinology.  No chest pain.  Breathing stable.     ROS: See pertinent positives and negatives per HPI.  Past Medical History:  Diagnosis Date  . Abnormal chest CT    right hilar cyst  . Anxiety   . Fibrocystic disease of breast   . GERD (gastroesophageal reflux disease)   . History of thrombocytopenia   . Hypercholesterolemia   . Hyperglycemia   . Hypertension   . Osteoarthritis   . Osteoporosis    s/p Actonel, Reclast (last 2011)  . Palpitations   . Pre-diabetes   . Psoriasis   . Reflux esophagitis     Past Surgical History:  Procedure Laterality Date  . ABDOMINAL HYSTERECTOMY  1980  . BREAST EXCISIONAL BIOPSY Left 1976   benign  . COLONOSCOPY WITH PROPOFOL N/A 05/05/2018   Procedure: COLONOSCOPY WITH PROPOFOL;  Surgeon: Manya Silvas, MD;  Location: [facility] ENDOSCOPY;  Service: Endoscopy;  Laterality: N/A;  . ESOPHAGOGASTRODUODENOSCOPY (EGD) WITH PROPOFOL N/A 05/05/2018   Procedure: ESOPHAGOGASTRODUODENOSCOPY (EGD) WITH PROPOFOL;  Surgeon: Manya Silvas, MD;  Location: [facility] ENDOSCOPY;  Service: Endoscopy;  Laterality: N/A;  . THUMB AMPUTATION Left 2015    Family History  Problem Relation Age of Onset  . Heart disease Mother   . Hypertension Mother   . Mental illness  Other        sibling, suicide  . Colon cancer Sister   . Sleep apnea Brother   . Breast cancer Neg Hx     SOCIAL HX: reviewed.    Current Outpatient Medications:  .  acebutolol (SECTRAL) 200 MG capsule, TAKE 2 CAPSULES BY MOUTH EVERY MORNING & 1 CAPSULE EVERY EVENING, Disp: 270 capsule, Rfl: 2 .  amLODipine (NORVASC) 5 MG tablet, TAKE 1 TABLET BY MOUTH EVERY DAY, Disp: 90 tablet, Rfl: 1 .  aspirin 81 MG tablet, Take  81 mg by mouth daily., Disp: , Rfl:  .  calcium carbonate (OS-CAL) 1250 MG chewable tablet, Chew 1 tablet by mouth 2 (two) times daily., Disp: , Rfl:  .  calcium elemental as carbonate (BARIATRIC TUMS ULTRA) 400 MG chewable tablet, Chew by mouth., Disp: , Rfl:  .  clobetasol ointment (TEMOVATE) 0.05 %, Apply 1 application topically 2 (two) times daily., Disp: , Rfl:  .  esomeprazole (NEXIUM) 40 MG capsule, TAKE ONE CAPSULE BY MOUTH TWICE A DAY (INS WILL ONLY COVER 1 PER DAY- GETTING PA), Disp: 180 capsule, Rfl: 1 .  losartan (COZAAR) 100 MG tablet, TAKE 1 TABLET BY MOUTH EVERY DAY, Disp: 90 tablet, Rfl: 1 .  magnesium oxide (MAG-OX) 400 MG tablet, Take 400 mg by mouth daily., Disp: , Rfl:  .  metoCLOPramide (REGLAN) 5 MG tablet, Take 1 tablet (5 mg total) by mouth every 8 (eight) hours as needed for nausea or vomiting., Disp: 20 tablet, Rfl: 0 .  nitroGLYCERIN (NITROSTAT) 0.4 MG SL tablet, Place 1 tablet (0.4 mg total) under the tongue every 5 (five) minutes as needed for chest pain. May repeat x 1.  If persistent pain, call 911 (Patient taking differently: Place 0.4 mg under the tongue every 5 (five) minutes as needed for chest pain. May repeat x 1.  If persistent pain, call 911), Disp: 25 tablet, Rfl: 0 .  simvastatin (ZOCOR) 40 MG tablet, TAKE 1 TABLET BY MOUTH EVERYDAY AT BEDTIME, Disp: 90 tablet, Rfl: 1 .  triamcinolone cream (KENALOG) 0.1 %, Apply 1 application topically 2 (two) times daily., Disp: 45 g, Rfl: 0  EXAM:  GENERAL: alert. Sounds to be in no acute distress.  Answering questions appropriately.   PSYCH/NEURO: pleasant and cooperative, no obvious depression or anxiety, speech and thought processing grossly intact  ASSESSMENT AND PLAN:  Discussed the following assessment and plan:  Problem List Items Addressed This Visit    Abdominal pain    Continued intermittent episodes of abdominal pain, nausea and vomiting.  Has had extensive GI w/up as outlined.  Previous EGD - gastritis.   Celiac testing negative.  S/p treatment for SIBO.  Off budesonide.  Has adjusted dietary intake. Continues to have pain and continues with weight loss.  Given persistence, schedule CT abdomen and pelvis.  Further w/up pending results.        Relevant Orders   CT Abdomen Pelvis Wo Contrast   Carotid stenosis    Carotid plaque noted on CT scan.  Needs carotid ultrasound to further evaluate.       Relevant Orders   VAS US CAROTID   Diabetes ([facility])    Follow met b and a1c.  Has lost weight unintentionally.        Essential hypertension    Continue losartan and amlodipine.  Follow pressures.  Follow metabolic panel.       GERD (gastroesophageal reflux disease)    No upper symptoms reported.  Continue nexium.  Hypercholesterolemia    Continue simvastatin.  Follow lipid panel and liver function tests.       Lymphocytic colitis    Previously found on colonoscopy pathology.  Was on budesonide.  Off now.  Saw GI.  Seeing Dr Vicente Males.  Taking reglan now.  Just started probiotics.  Persistent symptoms.  Discussed f/u with GI.       Nausea    Persistent intermittent nausea, vomiting, abdominal pain and weight loss.  W/up as outlined including colonoscopy and gastric emptying study as outlined.  Given persistent symptoms and abdominal pain, schedule for abdominal and pelvic CT scan.  Further w/up pending results.  Needs f/u with GI.       Relevant Orders   CT Abdomen Pelvis Wo Contrast   Thyroid nodule    S/p biopsy - pathology - atypia of undetermined significance.  Saw ENT.  Refer to endocrinology.       Weight loss    Persistent unintentional weight loss associated with nausea, vomiting and abdominal pain.  GI w/up as outlined.  Schedule CT scan abdomen and pelvis to evaluate further. Continue probiotics and diet adjustment.  Plan f/u with GI.       Relevant Orders   CT Abdomen Pelvis Wo Contrast       I discussed the assessment and treatment plan with the patient. The patient  was provided an opportunity to ask questions and all were answered. The patient agreed with the plan and demonstrated an understanding of the instructions.   The patient was advised to call back or seek an in-person evaluation if the symptoms worsen or if the condition fails to improve as anticipated.  I provided 23 minutes of non-face-to-face time during this encounter.   Einar Pheasant, MD

## 2021-03-22 ENCOUNTER — Encounter: Payer: Self-pay | Admitting: Internal Medicine

## 2021-03-22 DIAGNOSIS — R634 Abnormal weight loss: Secondary | ICD-10-CM | POA: Insufficient documentation

## 2021-03-22 DIAGNOSIS — I6529 Occlusion and stenosis of unspecified carotid artery: Secondary | ICD-10-CM | POA: Insufficient documentation

## 2021-03-22 DIAGNOSIS — I779 Disorder of arteries and arterioles, unspecified: Secondary | ICD-10-CM | POA: Insufficient documentation

## 2021-03-22 NOTE — Assessment & Plan Note (Signed)
Continued intermittent episodes of abdominal pain, nausea and vomiting.  Has had extensive GI w/up as outlined.  Previous EGD - gastritis.  Celiac testing negative.  S/p treatment for SIBO.  Off budesonide.  Has adjusted dietary intake. Continues to have pain and continues with weight loss.  Given persistence, schedule CT abdomen and pelvis.  Further w/up pending results.

## 2021-03-22 NOTE — Assessment & Plan Note (Signed)
Carotid plaque noted on CT scan.  Needs carotid ultrasound to further evaluate.

## 2021-03-22 NOTE — Assessment & Plan Note (Signed)
No upper symptoms reported.  Continue nexium.  

## 2021-03-22 NOTE — Assessment & Plan Note (Signed)
Continue losartan and amlodipine.  Follow pressures.  Follow metabolic panel.  

## 2021-03-22 NOTE — Assessment & Plan Note (Signed)
Persistent intermittent nausea, vomiting, abdominal pain and weight loss.  W/up as outlined including colonoscopy and gastric emptying study as outlined.  Given persistent symptoms and abdominal pain, schedule for abdominal and pelvic CT scan.  Further w/up pending results.  Needs f/u with GI.

## 2021-03-22 NOTE — Assessment & Plan Note (Signed)
Continue simvastatin.  Follow lipid panel and liver function tests.   

## 2021-03-22 NOTE — Assessment & Plan Note (Signed)
S/p biopsy - pathology - atypia of undetermined significance.  Saw ENT.  Refer to endocrinology.

## 2021-03-22 NOTE — Assessment & Plan Note (Signed)
Previously found on colonoscopy pathology.  Was on budesonide.  Off now.  Saw GI.  Seeing Dr Vicente Males.  Taking reglan now.  Just started probiotics.  Persistent symptoms.  Discussed f/u with GI.

## 2021-03-22 NOTE — Assessment & Plan Note (Signed)
Follow met b and a1c.  Has lost weight unintentionally.

## 2021-03-22 NOTE — Assessment & Plan Note (Signed)
Persistent unintentional weight loss associated with nausea, vomiting and abdominal pain.  GI w/up as outlined.  Schedule CT scan abdomen and pelvis to evaluate further. Continue probiotics and diet adjustment.  Plan f/u with GI.

## 2021-03-24 ENCOUNTER — Ambulatory Visit (INDEPENDENT_AMBULATORY_CARE_PROVIDER_SITE_OTHER): Payer: Medicare Other

## 2021-03-24 ENCOUNTER — Other Ambulatory Visit: Payer: Self-pay

## 2021-03-24 DIAGNOSIS — I6529 Occlusion and stenosis of unspecified carotid artery: Secondary | ICD-10-CM

## 2021-04-13 ENCOUNTER — Ambulatory Visit: Payer: Medicare Other | Admitting: Gastroenterology

## 2021-04-13 ENCOUNTER — Encounter: Payer: Self-pay | Admitting: Gastroenterology

## 2021-04-13 ENCOUNTER — Other Ambulatory Visit: Payer: Self-pay

## 2021-04-13 VITALS — BP 154/73 | HR 67 | Ht 60.0 in | Wt 108.6 lb

## 2021-04-13 DIAGNOSIS — R109 Unspecified abdominal pain: Secondary | ICD-10-CM

## 2021-04-13 DIAGNOSIS — R634 Abnormal weight loss: Secondary | ICD-10-CM | POA: Diagnosis not present

## 2021-04-13 NOTE — Progress Notes (Signed)
Jonathon Bellows MD, MRCP(U.K) [address]  [place]  [place], [place] [address]  Main: [phone]  Fax: [phone]   Primary Care Physician: Einar Pheasant, MD  Primary Gastroenterologist:  Dr. Jonathon Bellows   Follow up for nausea and abdominal pain    HPI: Teresa Wade is a 87 y.o. female   Summary of history : Initially referred and seen for nausea, vomiting and abdominal cramping in 10/2020. Previously a [facility] patient. She states that she has had issues with nausea and vomiting for many years. Has not been given a clear diagnosis. She says the vomiting and nausea is usually in the morning and she brings out hold undigested food that she had eaten previously. Foods which are high in fat content make it worse. Denies any headaches or imbalance. Some weight loss. Denies any diarrhea. Has 1 bowel movement that is hard every other day. Takes MiraLAX as needed. Some abdominal discomfort.  Per last office visit in May 2020,  last EGD was in 2013. Colonoscopy in 2019 showed sigmoid diverticulosis and biopsy results of the colon suggestive of lymphocytic colitis. Had a gastric emptying study back then which was normal was seen by [facility] for a second opinion and was scheduled for bacterial breath overgrowth breath test which was subsequently positive in November 2019 and treated with  Keflex and Flagyl, budesonide 3 mg a day and also had completed a course of Xifaxan for 14 days.  Plan at her last visit was addressing her depression or anxiety. She had requested follow-up with [facility] a DEXA scan was recommended at that time which she wanted to discuss with her doctor.   Interval history  12/25/2020-04/13/2021  She states that since her last visit she has had recurrence of the abdominal pain.  Takes her Nexium in addition to Mylanta daily does not seem to change her pain much.  The pain is similar to what she has had in the past.  Lower abdominal pain.  Not related  to food intake.  Not relieved with a bowel movement.  She is having daily bowel movements which is soft in nature.  Lost over 17 pounds of the last few months.  No change in bowel habits.   Current Outpatient Medications  Medication Sig Dispense Refill  . acebutolol (SECTRAL) 200 MG capsule TAKE 2 CAPSULES BY MOUTH EVERY MORNING & 1 CAPSULE EVERY EVENING 270 capsule 2  . amLODipine (NORVASC) 5 MG tablet TAKE 1 TABLET BY MOUTH EVERY DAY 90 tablet 1  . aspirin 81 MG tablet Take 81 mg by mouth daily.    . calcium carbonate (OS-CAL) 1250 MG chewable tablet Chew 1 tablet by mouth 2 (two) times daily.    . calcium elemental as carbonate (BARIATRIC TUMS ULTRA) 400 MG chewable tablet Chew by mouth.    . clobetasol ointment (TEMOVATE) 8.52 % Apply 1 application topically 2 (two) times daily.    Marland Kitchen esomeprazole (NEXIUM) 40 MG capsule TAKE ONE CAPSULE BY MOUTH TWICE A DAY (INS WILL ONLY COVER 1 PER DAY- GETTING PA) 180 capsule 1  . losartan (COZAAR) 100 MG tablet TAKE 1 TABLET BY MOUTH EVERY DAY 90 tablet 1  . magnesium oxide (MAG-OX) 400 MG tablet Take 400 mg by mouth daily.    . simvastatin (ZOCOR) 40 MG tablet TAKE 1 TABLET BY MOUTH EVERYDAY AT BEDTIME 90 tablet 1  . triamcinolone cream (KENALOG) 0.1 % Apply 1 application topically 2 (two) times daily. 45 g 0  .  metoCLOPramide (REGLAN) 5 MG tablet Take 1 tablet (5 mg total) by mouth every 8 (eight) hours as needed for nausea or vomiting. 20 tablet 0  . nitroGLYCERIN (NITROSTAT) 0.4 MG SL tablet Place 1 tablet (0.4 mg total) under the tongue every 5 (five) minutes as needed for chest pain. May repeat x 1.  If persistent pain, call 911 (Patient taking differently: Place 0.4 mg under the tongue every 5 (five) minutes as needed for chest pain. May repeat x 1.  If persistent pain, call 911) 25 tablet 0   No current facility-administered medications for this visit.    Allergies as of 04/13/2021 - Review Complete 04/13/2021  Allergen Reaction Noted  .  Amoxicillin-pot clavulanate Other (See Comments) 12/25/2018  . Clarithromycin  11/08/2012  . Lodine [etodolac] Diarrhea 11/07/2012  . Minocycline Other (See Comments) 11/08/2012  . Naproxen sodium  06/22/2010  . Rofecoxib  06/22/2010  . Vibramycin [doxycycline calcium] Other (See Comments) 11/07/2012  . Mobic [meloxicam] Rash 11/07/2012    ROS:  General: Negative for anorexia, weight loss, fever, chills, fatigue, weakness. ENT: Negative for hoarseness, difficulty swallowing , nasal congestion. CV: Negative for chest pain, angina, palpitations, dyspnea on exertion, peripheral edema.  Respiratory: Negative for dyspnea at rest, dyspnea on exertion, cough, sputum, wheezing.  GI: See history of present illness. GU:  Negative for dysuria, hematuria, urinary incontinence, urinary frequency, nocturnal urination.  Endo: Negative for unusual weight change.    Physical Examination:   BP (!) 154/73 (BP Location: Left Arm, Patient Position: Sitting, Cuff Size: Normal)   Pulse 67   Ht 5' (1.524 m)   Wt 108 lb 9.6 oz (49.3 kg)   LMP 11/09/1979   BMI 21.21 kg/m   General: Well-nourished, well-developed in no acute distress.  Eyes: No icterus. Conjunctivae pink. Mouth: Oropharyngeal mucosa moist and pink , no lesions erythema or exudate. Lungs: Clear to auscultation bilaterally. Non-labored. Heart: Regular rate and rhythm, no murmurs rubs or gallops.  Abdomen: Bowel sounds are normal, nontender, nondistended, no hepatosplenomegaly or masses, no abdominal bruits or hernia , no rebound or guarding.   Extremities: No lower extremity edema. No clubbing or deformities. Neuro: Alert and oriented x 3.  Grossly intact. Skin: Warm and dry, no jaundice.   Psych: Alert and cooperative, normal mood and affect.   Imaging Studies: VAS US CAROTID  Result Date: 03/24/2021 Carotid Arterial Duplex Study Patient Name:  Teresa Wade Mclane  Date of Exam:   03/24/2021 Medical Rec #: [id number]          Accession  #:    [id number] Date of Birth: [birth year]           Patient Gender: F Patient Age:   89Y Exam Location:  [facility] Procedure:      VAS US CAROTID Referring Phys: [id number] [place] --------------------------------------------------------------------------------  Indications: Left bruit and Carotid artery disease. Performing Technologist: Concha Norway RVT  Examination Guidelines: A complete evaluation includes B-mode imaging, spectral Doppler, color Doppler, and power Doppler as needed of all accessible portions of each vessel. Bilateral testing is considered an integral part of a complete examination. Limited examinations for reoccurring indications may be performed as noted.  Right Carotid Findings: +----------+--------+--------+--------+------------------+--------+           PSV cm/sEDV cm/sStenosisPlaque DescriptionComments +----------+--------+--------+--------+------------------+--------+ CCA Prox  60      11                                         +----------+--------+--------+--------+------------------+--------+  CCA Mid   66      11                                         +----------+--------+--------+--------+------------------+--------+ CCA Distal55      13                                         +----------+--------+--------+--------+------------------+--------+ ICA Prox  95      22      1-39%   calcific                   +----------+--------+--------+--------+------------------+--------+ ICA Mid   66      11                                         +----------+--------+--------+--------+------------------+--------+ ICA Distal66      13                                         +----------+--------+--------+--------+------------------+--------+ ECA       95      4                                          +----------+--------+--------+--------+------------------+--------+  +----------+--------+-------+----------------+-------------------+           PSV cm/sEDV cmsDescribe        Arm Pressure (mmHG) +----------+--------+-------+----------------+-------------------+ [id number]             Multiphasic, WNL                    +----------+--------+-------+----------------+-------------------+ +---------+--------+--+--------+---------+ VertebralPSV cm/s46EDV cm/sAntegrade +---------+--------+--+--------+---------+  Left Carotid Findings: +----------+--------+--------+--------+------------------+--------+           PSV cm/sEDV cm/sStenosisPlaque DescriptionComments +----------+--------+--------+--------+------------------+--------+ CCA Prox  86      14                                         +----------+--------+--------+--------+------------------+--------+ CCA Mid   73      16                                         +----------+--------+--------+--------+------------------+--------+ CCA Distal67      13                                         +----------+--------+--------+--------+------------------+--------+ ICA Prox  132     33      40-59%  calcific          tortuous +----------+--------+--------+--------+------------------+--------+ ICA Mid   115                                                +----------+--------+--------+--------+------------------+--------+  ICA Distal84      19                                         +----------+--------+--------+--------+------------------+--------+ ECA       83      6                                          +----------+--------+--------+--------+------------------+--------+ +----------+--------+--------+----------------+-------------------+           PSV cm/sEDV cm/sDescribe        Arm Pressure (mmHG) +----------+--------+--------+----------------+-------------------+ [id number]             Multiphasic, WNL                     +----------+--------+--------+----------------+-------------------+ +---------+--------+--+--------+---------+ VertebralPSV cm/s39EDV cm/sAntegrade +---------+--------+--+--------+---------+   Summary: Right Carotid: Velocities in the right ICA are consistent with a 1-39% stenosis.                Mild calcified plaque. Left Carotid: Velocities in the left ICA are consistent with a 40-59% stenosis.               Tortuous proximal ICA. Mild calcified plaque. Vertebrals:  Bilateral vertebral arteries demonstrate antegrade flow. Subclavians: Normal flow hemodynamics were seen in bilateral subclavian              arteries. *See table(s) above for measurements and observations.  Electronically signed by Leotis Pain MD on 03/24/2021 at 1:16:42 PM.    Final     Assessment and Plan:   Teresa Wade is a 87 y.o. y/o female  with a prior history of  Extensive evaluations/treatments   by [facility] and [facility]  for lymphocytic colitis, bacterial overgrowth syndrome, has had a negative gastric emptying study. She has a chronic history of nausea vomiting abdominal discomfort. Functional disorders has been entertained as well in the past. She is here to see me as a third gastroenterologist.  It is possible that she may have episodic gastroparesis.  Since her last visit she has had 17 pounds of unintentional weight loss with worsening of her lower abdominal pain.  Plan 1.   Perform EGD next week to evaluate abdominal pain.  I gave her the option of performing a colonoscopy at the same time since its been 3 years since she has had red flag symptoms since.  She would like to wait till her CT scan and upper endoscopy have been done.  2.  Await results of CT scan of the abdomen which has been scheduled in 2 days time ordered by Dr. Nicki Reaper if negative may need to consider CT chest as well.  3.  Check CBC CMP and TSH   I have discussed alternative options, risks & benefits,  which include, but are not  limited to, bleeding, infection, perforation,respiratory complication & drug reaction.  The patient agrees with this plan & written consent will be obtained.     Dr Jonathon Bellows  MD,MRCP [facility]) Follow up in 4 weeks

## 2021-04-14 LAB — CBC WITH DIFFERENTIAL/PLATELET
Basophils Absolute: 0 10*3/uL (ref 0.0–0.2)
Basos: 1 %
EOS (ABSOLUTE): 0 10*3/uL (ref 0.0–0.4)
Eos: 0 %
Hematocrit: 41.3 % (ref 34.0–46.6)
Hemoglobin: 14 g/dL (ref 11.1–15.9)
Immature Grans (Abs): 0 10*3/uL (ref 0.0–0.1)
Immature Granulocytes: 0 %
Lymphocytes Absolute: 2.1 10*3/uL (ref 0.7–3.1)
Lymphs: 30 %
MCH: 30.6 pg (ref 26.6–33.0)
MCHC: 33.9 g/dL (ref 31.5–35.7)
MCV: 90 fL (ref 79–97)
Monocytes Absolute: 0.6 10*3/uL (ref 0.1–0.9)
Monocytes: 9 %
Neutrophils Absolute: 4.3 10*3/uL (ref 1.4–7.0)
Neutrophils: 60 %
Platelets: 310 10*3/uL (ref 150–450)
RBC: 4.57 x10E6/uL (ref 3.77–5.28)
RDW: 12.2 % (ref 11.7–15.4)
WBC: 7.1 10*3/uL (ref 3.4–10.8)

## 2021-04-14 LAB — COMPREHENSIVE METABOLIC PANEL
ALT: 11 IU/L (ref 0–32)
AST: 17 IU/L (ref 0–40)
Albumin/Globulin Ratio: 1.7 (ref 1.2–2.2)
Albumin: 4.2 g/dL (ref 3.6–4.6)
Alkaline Phosphatase: 103 IU/L (ref 44–121)
BUN/Creatinine Ratio: 20 (ref 12–28)
BUN: 16 mg/dL (ref 8–27)
Bilirubin Total: 0.6 mg/dL (ref 0.0–1.2)
CO2: 23 mmol/L (ref 20–29)
Calcium: 9.9 mg/dL (ref 8.7–10.3)
Chloride: 100 mmol/L (ref 96–106)
Creatinine, Ser: 0.8 mg/dL (ref 0.57–1.00)
Globulin, Total: 2.5 g/dL (ref 1.5–4.5)
Glucose: 128 mg/dL — ABNORMAL HIGH (ref 65–99)
Potassium: 4.7 mmol/L (ref 3.5–5.2)
Sodium: 139 mmol/L (ref 134–144)
Total Protein: 6.7 g/dL (ref 6.0–8.5)
eGFR: 71 mL/min/{1.73_m2} (ref >59)

## 2021-04-14 LAB — TSH: TSH: 0.591 u[IU]/mL (ref 0.450–4.500)

## 2021-04-15 ENCOUNTER — Ambulatory Visit
Admission: RE | Admit: 2021-04-15 | Discharge: 2021-04-15 | Disposition: A | Discharge status: Home / Self Care | Payer: Medicare Other | Source: Ambulatory Visit | Attending: Internal Medicine | Admitting: Internal Medicine

## 2021-04-15 ENCOUNTER — Other Ambulatory Visit: Payer: Self-pay

## 2021-04-15 DIAGNOSIS — R109 Unspecified abdominal pain: Secondary | ICD-10-CM | POA: Insufficient documentation

## 2021-04-15 DIAGNOSIS — R110 Nausea: Secondary | ICD-10-CM | POA: Diagnosis not present

## 2021-04-15 DIAGNOSIS — R634 Abnormal weight loss: Secondary | ICD-10-CM | POA: Insufficient documentation

## 2021-04-16 ENCOUNTER — Ambulatory Visit: Payer: Medicare Other | Admitting: Anesthesiology

## 2021-04-16 ENCOUNTER — Ambulatory Visit
Admission: RE | Admit: 2021-04-16 | Discharge: 2021-04-16 | Disposition: A | Discharge status: Home / Self Care | Payer: Medicare Other | Attending: Gastroenterology | Admitting: Gastroenterology

## 2021-04-16 ENCOUNTER — Encounter
Admission: RE | Disposition: A | Discharge status: Home / Self Care | Payer: Self-pay | Source: Home / Self Care | Attending: Gastroenterology

## 2021-04-16 ENCOUNTER — Encounter: Payer: Self-pay | Admitting: Gastroenterology

## 2021-04-16 ENCOUNTER — Other Ambulatory Visit: Payer: Self-pay

## 2021-04-16 DIAGNOSIS — Z89012 Acquired absence of left thumb: Secondary | ICD-10-CM | POA: Diagnosis not present

## 2021-04-16 DIAGNOSIS — Z87891 Personal history of nicotine dependence: Secondary | ICD-10-CM | POA: Insufficient documentation

## 2021-04-16 DIAGNOSIS — Z881 Allergy status to other antibiotic agents status: Secondary | ICD-10-CM | POA: Diagnosis not present

## 2021-04-16 DIAGNOSIS — K2980 Duodenitis without bleeding: Secondary | ICD-10-CM | POA: Diagnosis not present

## 2021-04-16 DIAGNOSIS — Z880 Allergy status to penicillin: Secondary | ICD-10-CM | POA: Insufficient documentation

## 2021-04-16 DIAGNOSIS — Z886 Allergy status to analgesic agent status: Secondary | ICD-10-CM | POA: Diagnosis not present

## 2021-04-16 DIAGNOSIS — K629 Disease of anus and rectum, unspecified: Secondary | ICD-10-CM

## 2021-04-16 DIAGNOSIS — K317 Polyp of stomach and duodenum: Secondary | ICD-10-CM | POA: Diagnosis not present

## 2021-04-16 DIAGNOSIS — Z791 Long term (current) use of non-steroidal anti-inflammatories (NSAID): Secondary | ICD-10-CM | POA: Insufficient documentation

## 2021-04-16 DIAGNOSIS — R109 Unspecified abdominal pain: Secondary | ICD-10-CM | POA: Insufficient documentation

## 2021-04-16 DIAGNOSIS — R634 Abnormal weight loss: Secondary | ICD-10-CM

## 2021-04-16 DIAGNOSIS — Z888 Allergy status to other drugs, medicaments and biological substances status: Secondary | ICD-10-CM | POA: Insufficient documentation

## 2021-04-16 DIAGNOSIS — K2950 Unspecified chronic gastritis without bleeding: Secondary | ICD-10-CM | POA: Insufficient documentation

## 2021-04-16 DIAGNOSIS — Z7982 Long term (current) use of aspirin: Secondary | ICD-10-CM | POA: Insufficient documentation

## 2021-04-16 HISTORY — PX: ESOPHAGOGASTRODUODENOSCOPY (EGD) WITH PROPOFOL: SHX5813

## 2021-04-16 SURGERY — ESOPHAGOGASTRODUODENOSCOPY (EGD) WITH PROPOFOL
Anesthesia: General

## 2021-04-16 MED ORDER — LIDOCAINE HCL (PF) 2 % IJ SOLN
INTRAMUSCULAR | Status: AC
  Filled 2021-04-16: qty 2

## 2021-04-16 MED ORDER — SODIUM CHLORIDE 0.9 % IV SOLN: INTRAVENOUS | Status: DC

## 2021-04-16 MED ORDER — LIDOCAINE HCL (PF) 1 % IJ SOLN
INTRAMUSCULAR | Status: AC
  Filled 2021-04-16: qty 2

## 2021-04-16 MED ORDER — PROPOFOL 500 MG/50ML IV EMUL
INTRAVENOUS | Status: AC
  Filled 2021-04-16: qty 50

## 2021-04-16 MED ORDER — PROPOFOL 500 MG/50ML IV EMUL
INTRAVENOUS | Status: DC | PRN
  Administered 2021-04-16: 120 ug/kg/min via INTRAVENOUS

## 2021-04-16 MED ORDER — LIDOCAINE HCL (CARDIAC) PF 100 MG/5ML IV SOSY
PREFILLED_SYRINGE | INTRAVENOUS | Status: DC | PRN
  Administered 2021-04-16: 30 mg via INTRAVENOUS

## 2021-04-16 NOTE — Op Note (Signed)
[facility] Patient Name: Teresa Wade Procedure Date: 04/16/2021 7:18 AM MRN: [id number] Account #: [id number] Date of Birth: Nov 20, 1932 Admit Type: Outpatient Age: 87 Room: [facility] ENDO ROOM 3 Gender: Female Note Status: Finalized Procedure:             Upper GI endoscopy Indications:           Abdominal pain Providers:             Jonathon Bellows MD, MD Referring MD:          Einar Pheasant, MD (Referring MD) Medicines:             Monitored Anesthesia Care Complications:         No immediate complications. Procedure:             Pre-Anesthesia Assessment:                        - Prior to the procedure, a History and Physical was                         performed, and patient medications, allergies and                         sensitivities were reviewed. The patient's tolerance                         of previous anesthesia was reviewed.                        - The risks and benefits of the procedure and the                         sedation options and risks were discussed with the                         patient. All questions were answered and informed                         consent was obtained.                        - ASA Grade Assessment: II - A patient with mild                         systemic disease.                        After obtaining informed consent, the endoscope was                         passed under direct vision. Throughout the procedure,                         the patient's blood pressure, pulse, and oxygen                         saturations were monitored continuously. The Endoscope                         was introduced through the mouth, and advanced  to the                         third part of duodenum. The upper GI endoscopy was                         accomplished with ease. The patient tolerated the                         procedure well. Findings:      The esophagus was normal.      A single 10 mm pedunculated  polyp with no bleeding and no stigmata of       recent bleeding was found in the cardia. The polyp was removed with a       cold snare. Resection and retrieval were complete. To prevent bleeding       after the polypectomy, one hemostatic clip was successfully placed.       There was no bleeding at the end of the procedure.      Normal mucosa was found in the entire examined stomach. Biopsies were       taken with a cold forceps for histology.      Localized mild inflammation characterized by erosions, erythema and       aphthous ulcerations was found in the second portion of the duodenum.       Biopsies were taken with a cold forceps for histology.      The cardia and gastric fundus were normal on retroflexion. Impression:            - Normal esophagus.                        - A single gastric polyp. Resected and retrieved. Clip                         was placed.                        - Normal mucosa was found in the entire stomach.                         Biopsied.                        - Duodenitis. Biopsied. Recommendation:        - Await pathology results.                        - Discharge patient to home (with escort).                        - Resume previous diet.                        - Continue present medications.                        - Return to my office as previously scheduled. Procedure Code(s):     --- Professional ---                        [phone], Esophagogastroduodenoscopy, flexible,  transoral; with removal of tumor(s), polyp(s), or                         other lesion(s) by snare technique                        [id number], 59, Esophagogastroduodenoscopy, flexible,                         transoral; with biopsy, single or multiple Diagnosis Code(s):     --- Professional ---                        K31.7, Polyp of stomach and duodenum                        K29.80, Duodenitis without bleeding                        R10.9, Unspecified abdominal  pain CPT copyright 2019 American Medical Association. All rights reserved. The codes documented in this report are preliminary and upon coder review may  be revised to meet current compliance requirements. Jonathon Bellows, MD Jonathon Bellows MD, MD 04/16/2021 8:00:02 AM This report has been signed electronically. Number of Addenda: 0 Note Initiated On: 04/16/2021 7:18 AM Estimated Blood Loss:  Estimated blood loss: none.      [facility]

## 2021-04-16 NOTE — Transfer of Care (Signed)
Immediate Anesthesia Transfer of Care Note  Patient: Teresa Wade  Procedure(s) Performed: ESOPHAGOGASTRODUODENOSCOPY (EGD) WITH PROPOFOL (N/A )  Patient Location: PACU  Anesthesia Type:General  Level of Consciousness: sedated  Airway & Oxygen Therapy: Patient Spontanous Breathing and Patient connected to nasal cannula oxygen  Post-op Assessment: Report given to RN and Post -op Vital signs reviewed and stable  Post vital signs: Reviewed and stable  Last Vitals:  Vitals Value Taken Time  BP    Temp    Pulse    Resp    SpO2      Last Pain:  Vitals:   04/16/21 0654  TempSrc: Temporal  PainSc: 0-No pain         Complications: No complications documented.

## 2021-04-16 NOTE — H&P (Signed)
Teresa Bellows, MD [address] [place], [place], [place], [address] [address], [place], [place], [place], [address] Phone: [phone]  Fax: [phone]  Primary Care Physician:  Einar Pheasant, MD   Pre-Procedure History & Physical: HPI:  Teresa Wade is a 87 y.o. female is here for an endoscopy    Past Medical History:  Diagnosis Date  . Abnormal chest CT    right hilar cyst  . Anxiety   . Fibrocystic disease of breast   . GERD (gastroesophageal reflux disease)   . History of thrombocytopenia   . Hypercholesterolemia   . Hyperglycemia   . Hypertension   . Osteoarthritis   . Osteoporosis    s/p Actonel, Reclast (last 2011)  . Palpitations   . Pre-diabetes   . Psoriasis   . Reflux esophagitis     Past Surgical History:  Procedure Laterality Date  . ABDOMINAL HYSTERECTOMY  1980  . BREAST EXCISIONAL BIOPSY Left 1976   benign  . COLONOSCOPY WITH PROPOFOL N/A 05/05/2018   Procedure: COLONOSCOPY WITH PROPOFOL;  Surgeon: Manya Silvas, MD;  Location: [facility] ENDOSCOPY;  Service: Endoscopy;  Laterality: N/A;  . ESOPHAGOGASTRODUODENOSCOPY (EGD) WITH PROPOFOL N/A 05/05/2018   Procedure: ESOPHAGOGASTRODUODENOSCOPY (EGD) WITH PROPOFOL;  Surgeon: Manya Silvas, MD;  Location: [facility] ENDOSCOPY;  Service: Endoscopy;  Laterality: N/A;  . THUMB AMPUTATION Left 2015    Prior to Admission medications   Medication Sig Start Date End Date Taking? Authorizing Provider  triamcinolone cream (KENALOG) 0.1 % Apply 1 application topically 2 (two) times daily. 06/27/19  Yes Einar Pheasant, MD  acebutolol (SECTRAL) 200 MG capsule TAKE 2 CAPSULES BY MOUTH EVERY MORNING & 1 CAPSULE EVERY EVENING 06/18/20   Einar Pheasant, MD  amLODipine (NORVASC) 5 MG tablet TAKE 1 TABLET BY MOUTH EVERY DAY 03/10/21   Einar Pheasant, MD  aspirin 81 MG tablet Take 81 mg by mouth daily.    [provider]  calcium carbonate (OS-CAL) 1250 MG chewable tablet Chew 1 tablet by mouth 2 (two)  times daily.    [provider]  calcium elemental as carbonate (BARIATRIC TUMS ULTRA) 400 MG chewable tablet Chew by mouth.    [provider]  clobetasol ointment (TEMOVATE) 2.94 % Apply 1 application topically 2 (two) times daily.    [provider]  esomeprazole (NEXIUM) 40 MG capsule TAKE ONE CAPSULE BY MOUTH TWICE A DAY (INS WILL ONLY COVER 1 PER DAY- GETTING PA) 02/02/21   Einar Pheasant, MD  losartan (COZAAR) 100 MG tablet TAKE 1 TABLET BY MOUTH EVERY DAY 12/17/20   Einar Pheasant, MD  magnesium oxide (MAG-OX) 400 MG tablet Take 400 mg by mouth daily.    [provider]  metoCLOPramide (REGLAN) 5 MG tablet Take 1 tablet (5 mg total) by mouth every 8 (eight) hours as needed for nausea or vomiting. 10/29/20 01/27/21  Teresa Bellows, MD  nitroGLYCERIN (NITROSTAT) 0.4 MG SL tablet Place 1 tablet (0.4 mg total) under the tongue every 5 (five) minutes as needed for chest pain. May repeat x 1.  If persistent pain, call 911 Patient not taking: Reported on 04/16/2021 03/11/15   Einar Pheasant, MD  simvastatin (ZOCOR) 40 MG tablet TAKE 1 TABLET BY MOUTH EVERYDAY AT BEDTIME 12/17/20   Einar Pheasant, MD    Allergies as of 04/13/2021 - Review Complete 04/13/2021  Allergen Reaction Noted  . Amoxicillin-pot clavulanate Other (See Comments) 12/25/2018  . Clarithromycin  11/08/2012  . Lodine [etodolac] Diarrhea 11/07/2012  .  Minocycline Other (See Comments) 11/08/2012  . Naproxen sodium  06/22/2010  . Rofecoxib  06/22/2010  . Vibramycin [doxycycline calcium] Other (See Comments) 11/07/2012  . Mobic [meloxicam] Rash 11/07/2012    Family History  Problem Relation Age of Onset  . Heart disease Mother   . Hypertension Mother   . Mental illness Other        sibling, suicide  . Colon cancer Sister   . Sleep apnea Brother   . Breast cancer Neg Hx     Social History   Socioeconomic History  . Marital status: Married    Spouse name: Not on file  . Number of  children: 1  . Years of education: Not on file  . Highest education level: Not on file  Occupational History  . Not on file  Tobacco Use  . Smoking status: Former Smoker    Quit date: 1999    Years since quitting: 23.3  . Smokeless tobacco: Never Used  Vaping Use  . Vaping Use: Never used  Substance and Sexual Activity  . Alcohol use: No    Alcohol/week: 0.0 standard drinks  . Drug use: No  . Sexual activity: Never  Other Topics Concern  . Not on file  Social History Narrative  . Not on file   Social Determinants of Health   Financial Resource Strain: Not on file  Food Insecurity: Not on file  Transportation Needs: Not on file  Physical Activity: Not on file  Stress: Not on file  Social Connections: Not on file  Intimate Partner Violence: Not on file    Review of Systems: See HPI, otherwise negative ROS  Physical Exam: BP (!) 166/62   Pulse 72   Temp 97.9 F (36.6 C) (Temporal)   Resp 18   Ht 5' (1.524 m)   Wt 47.6 kg   LMP 11/09/1979   SpO2 100%   BMI 20.51 kg/m  General:   Alert,  pleasant and cooperative in NAD Head:  Normocephalic and atraumatic. Neck:  Supple; no masses or thyromegaly. Lungs:  Clear throughout to auscultation, normal respiratory effort.    Heart:  +S1, +S2, Regular rate and rhythm, No edema. Abdomen:  Soft, nontender and nondistended. Normal bowel sounds, without guarding, and without rebound.   Neurologic:  Alert and  oriented x4;  grossly normal neurologically.  Impression/Plan: Teresa Wade is here for an endoscopy  to be performed for  evaluation of abdominal pain     Risks, benefits, limitations, and alternatives regarding endoscopy have been reviewed with the patient.  Questions have been answered.  All parties agreeable.   Teresa Bellows, MD  04/16/2021, 7:45 AM

## 2021-04-16 NOTE — Anesthesia Procedure Notes (Signed)
Performed by: Cook-Martin, Joven Mom Pre-anesthesia Checklist: Patient identified, Emergency Drugs available, Suction available, Patient being monitored and Timeout performed Patient Re-evaluated:Patient Re-evaluated prior to induction Oxygen Delivery Method: Nasal cannula Preoxygenation: Pre-oxygenation with 100% oxygen Induction Type: IV induction Airway Equipment and Method: Bite block Placement Confirmation: positive ETCO2 and CO2 detector       

## 2021-04-16 NOTE — Anesthesia Postprocedure Evaluation (Signed)
Anesthesia Post Note  Patient: Creasie B Espejo  Procedure(s) Performed: ESOPHAGOGASTRODUODENOSCOPY (EGD) WITH PROPOFOL (N/A )  Patient location during evaluation: Endoscopy Anesthesia Type: General Level of consciousness: awake and alert Pain management: pain level controlled Vital Signs Assessment: post-procedure vital signs reviewed and stable Respiratory status: spontaneous breathing, nonlabored ventilation, respiratory function stable and patient connected to nasal cannula oxygen Cardiovascular status: blood pressure returned to baseline and stable Postop Assessment: no apparent nausea or vomiting Anesthetic complications: no   No complications documented.   Last Vitals:  Vitals:   04/16/21 0810 04/16/21 0820  BP: (!) 112/55 (!) 182/67  Pulse: 70 63  Resp: (!) 22 (!) 21  Temp:    SpO2: 98% 100%    Last Pain:  Vitals:   04/16/21 0820  TempSrc:   PainSc: 0-No pain                 Martha Clan

## 2021-04-16 NOTE — Anesthesia Preprocedure Evaluation (Signed)
Anesthesia Evaluation  Patient identified by MRN, date of birth, ID band Patient awake    Reviewed: Allergy & Precautions, H&P , NPO status , Patient's Chart, lab work & pertinent test results, reviewed documented beta blocker date and time   History of Anesthesia Complications Negative for: history of anesthetic complications  Airway Mallampati: II  TM Distance: >3 FB Neck ROM: full    Dental  (+) Dental Advidsory Given, Caps, Missing Permanent bridge on top right:   Pulmonary neg pulmonary ROS, former smoker,           Cardiovascular Exercise Tolerance: Good hypertension, (-) angina(-) CAD, (-) Past MI, (-) Cardiac Stents and (-) CABG + dysrhythmias (palpitations) (-) Valvular Problems/Murmurs     Neuro/Psych PSYCHIATRIC DISORDERS Anxiety negative neurological ROS     GI/Hepatic Neg liver ROS, GERD  ,  Endo/Other  diabetes (pre-diabetic)  Renal/GU negative Renal ROS  negative genitourinary   Musculoskeletal   Abdominal   Peds  Hematology negative hematology ROS (+)   Anesthesia Other Findings Past Medical History: No date: Abnormal chest CT     Comment:  right hilar cyst No date: Anxiety No date: Fibrocystic disease of breast No date: GERD (gastroesophageal reflux disease) No date: History of thrombocytopenia No date: Hypercholesterolemia No date: Hyperglycemia No date: Hypertension No date: Osteoarthritis No date: Osteoporosis     Comment:  s/p Actonel, Reclast (last 2011) No date: Palpitations No date: Pre-diabetes No date: Psoriasis No date: Reflux esophagitis   Reproductive/Obstetrics negative OB ROS                             Anesthesia Physical  Anesthesia Plan  ASA: III  Anesthesia Plan: General   Post-op Pain Management:    Induction: Intravenous  PONV Risk Score and Plan: 3 and Propofol infusion and TIVA  Airway Management Planned: Natural Airway and Nasal  Cannula  Additional Equipment:   Intra-op Plan:   Post-operative Plan:   Informed Consent: I have reviewed the patients History and Physical, chart, labs and discussed the procedure including the risks, benefits and alternatives for the proposed anesthesia with the patient or authorized representative who has indicated his/her understanding and acceptance.     Dental Advisory Given  Plan Discussed with: Anesthesiologist, CRNA and Surgeon  Anesthesia Plan Comments:         Anesthesia Quick Evaluation

## 2021-04-17 ENCOUNTER — Encounter: Payer: Self-pay | Admitting: Gastroenterology

## 2021-04-20 ENCOUNTER — Encounter: Payer: Self-pay | Admitting: Gastroenterology

## 2021-04-20 LAB — SURGICAL PATHOLOGY

## 2021-04-21 ENCOUNTER — Telehealth: Payer: Self-pay | Admitting: Gastroenterology

## 2021-04-21 NOTE — Telephone Encounter (Signed)
Patient called asks for call back to discuss results and next step for Tx.

## 2021-04-22 ENCOUNTER — Telehealth: Payer: Self-pay | Admitting: Gastroenterology

## 2021-04-22 NOTE — Telephone Encounter (Signed)
Message has been sent to Dr. Vicente Males for follow up steps.

## 2021-04-22 NOTE — Telephone Encounter (Signed)
Patient called second time asking for call back regarding results and what is next step in [place].

## 2021-04-24 ENCOUNTER — Telehealth: Payer: Self-pay | Admitting: Internal Medicine

## 2021-04-24 NOTE — Telephone Encounter (Signed)
-----   Message from Jonathon Bellows, MD sent at 04/24/2021  9:15 AM EDT ----- Regarding: RE: f/u Thank you so much , apparently she is better, I will see her at follow up and will consider MR Enterography   Regards  Kiran  ----- Message ----- From: Einar Pheasant, MD Sent: 04/24/2021   7:35 AM EDT To: Jonathon Bellows, MD Subject: f/u                                            I spoke to [name] about her CT scan.  CT recommended EGD. She is s/p EGD last week.  She is still having pain - she described pain - under her breast and extending to above her belly button.  States when she eats, she feels food hits her stomach.  Decreased appetite and is still losing weight.  Wanted to give you a little more information.  I saw your note about possible MR enterography.  Let me know if you need any other information or if you need me to do anything more.    Thank you for help in taking care of her.  I appreciate it.   Teresa Wade

## 2021-04-24 NOTE — Telephone Encounter (Signed)
Patient has been informed of results. Informed patient of Dr. Vicente Males comments/recommendations. Patient did inform me her primary doctor called and informed her as well. Reminded patient she has an appointment next month.

## 2021-04-25 NOTE — Telephone Encounter (Signed)
Pt aware of results.  See result note and phone message.

## 2021-05-14 ENCOUNTER — Ambulatory Visit: Payer: Medicare Other | Admitting: Gastroenterology

## 2021-05-14 ENCOUNTER — Encounter: Payer: Self-pay | Admitting: Gastroenterology

## 2021-05-14 ENCOUNTER — Other Ambulatory Visit: Payer: Self-pay | Admitting: Gastroenterology

## 2021-05-14 ENCOUNTER — Other Ambulatory Visit: Payer: Self-pay

## 2021-05-14 VITALS — BP 169/75 | HR 69 | Temp 98.3°F | Ht 60.0 in | Wt 105.2 lb

## 2021-05-14 DIAGNOSIS — R112 Nausea with vomiting, unspecified: Secondary | ICD-10-CM

## 2021-05-14 DIAGNOSIS — R109 Unspecified abdominal pain: Secondary | ICD-10-CM | POA: Diagnosis not present

## 2021-05-14 DIAGNOSIS — R634 Abnormal weight loss: Secondary | ICD-10-CM

## 2021-05-14 MED ORDER — METOCLOPRAMIDE HCL 5 MG PO TABS: 5.0000 mg | ORAL_TABLET | Freq: Three times a day (TID) | ORAL | 1 refills | Status: DC | PRN

## 2021-05-14 MED ORDER — SUCRALFATE 1 GM/10ML PO SUSP: 1.0000 g | Freq: Four times a day (QID) | ORAL | 2 refills | Status: DC

## 2021-05-14 MED ORDER — SUCRALFATE 1 GM/10ML PO SUSP: 1.0000 g | Freq: Four times a day (QID) | ORAL | 1 refills | Status: DC

## 2021-05-14 MED ORDER — DEXLANSOPRAZOLE 60 MG PO CPDR
60.0000 mg | DELAYED_RELEASE_CAPSULE | Freq: Every day | ORAL | 0 refills | Status: DC
Start: 2021-05-14 — End: 2021-05-31

## 2021-05-14 MED ORDER — METOCLOPRAMIDE HCL 5 MG PO TABS: 5.0000 mg | ORAL_TABLET | Freq: Three times a day (TID) | ORAL | 3 refills | Status: AC | PRN

## 2021-05-14 NOTE — Progress Notes (Signed)
Jonathon Bellows MD, MRCP(U.K) Teresa Wade  Teresa Wade,  [id number]  Main: [phone]  Fax: [phone]   Primary Care Physician: Einar Pheasant, MD  Primary Gastroenterologist:  Dr. Jonathon Bellows   F/u : chronic nausea , dyspepsia    HPI: Teresa Wade is a 87 y.o. female  Summary of history : Initially referred and seen for nausea, vomiting and abdominal cramping in 10/2020. Previously a [facility] patient.  She states that she has had issues with nausea and vomiting for many years.  Has not been given a clear diagnosis.  She says the vomiting and nausea is usually in the morning and she brings out hold undigested food that she had eaten previously.  Foods which are high in fat content make it worse.  Denies any headaches or imbalance.  Some weight loss.  Denies any diarrhea.  Has 1 bowel movement that is hard every other day.  Takes MiraLAX as needed.  Some abdominal discomfort.   Per last office visit in May 2020,  last EGD was in 2013.  Colonoscopy in 2019 showed sigmoid diverticulosis and biopsy results of the colon suggestive of lymphocytic colitis.  Had a gastric emptying study back then which was normal was seen by [facility] for a second opinion and was scheduled for bacterial breath overgrowth breath test which was subsequently positive in November 2019 and treated with  Keflex and Flagyl, budesonide 3 mg a day and also had completed a course of Xifaxan for 14 days.   Plan at her last visit was addressing her depression or anxiety.  She had requested follow-up with [facility] a DEXA scan was recommended at that time which she wanted to discuss with her doctor.   04/16/2021: EGD: Duodenitis - biopsy confirmed  and gastric polyp - fundic gland polyp and gastritis on biopsy . Negative for h pylori .   04/16/2021: CT abdomen and pelvis: Suspicion of gastric wall thickening the fundus and cardia, indeterminate for gastritis or mass, correlation with endoscopy may be  performed as indicated.2. Segment of thickened appearing small bowel in the right lower  quadrant without much inflammatory change. Patient noted to have small bowel diverticula. Differential considerations include wall thickening secondary to diverticulitis, focal enteritis secondary to infection or inflammatory bowel disease, and wall thickening  secondary to a mass. There are no obstructive features at this time. 3. Diverticular disease of the colon without acute wall thickening    Interval history 04/13/2021-05/14/2021  04/13/2021: Hb 14, Cr 0.80, TSH 0.591  Since her last visit she has been taking Reglan in December and feels that it helps her pretty well but she continues to have abdominal discomfort which subsided for a few days after endoscopy but has since recurred.  She takes Prilosec 40 mg twice daily.  Denies any nausea.  Has regular soft bowel movements daily.  She continues to say that her appetite is poor.      Current Outpatient Medications  Medication Sig Dispense Refill   acebutolol (SECTRAL) 200 MG capsule TAKE 2 CAPSULES BY MOUTH EVERY MORNING & 1 CAPSULE EVERY EVENING 270 capsule 2   amLODipine (NORVASC) 5 MG tablet TAKE 1 TABLET BY MOUTH EVERY DAY 90 tablet 1   aspirin 81 MG tablet Take 81 mg by mouth daily.     calcium carbonate (OS-CAL) 1250 MG chewable tablet Chew 1 tablet by mouth 2 (two) times daily.     calcium elemental as carbonate (BARIATRIC TUMS ULTRA) 400  MG chewable tablet Chew by mouth.     clobetasol ointment (TEMOVATE) 5.05 % Apply 1 application topically 2 (two) times daily.     losartan (COZAAR) 100 MG tablet TAKE 1 TABLET BY MOUTH EVERY DAY 90 tablet 1   magnesium oxide (MAG-OX) 400 MG tablet Take 400 mg by mouth daily.     metoCLOPramide (REGLAN) 5 MG tablet Take 1 tablet (5 mg total) by mouth every 8 (eight) hours as needed for nausea or vomiting. 20 tablet 0   nitroGLYCERIN (NITROSTAT) 0.4 MG SL tablet Place 1 tablet (0.4 mg total) under the tongue every  5 (five) minutes as needed for chest pain. May repeat x 1.  If persistent pain, call 911 (Patient not taking: Reported on 04/16/2021) 25 tablet 0   simvastatin (ZOCOR) 40 MG tablet TAKE 1 TABLET BY MOUTH EVERYDAY AT BEDTIME 90 tablet 1   triamcinolone cream (KENALOG) 0.1 % Apply 1 application topically 2 (two) times daily. 45 g 0   No current facility-administered medications for this visit.    Allergies as of 05/14/2021 - Review Complete 05/14/2021  Allergen Reaction Noted   Amoxicillin-pot clavulanate Other (See Comments) 12/25/2018   Clarithromycin  11/08/2012   Lodine [etodolac] Diarrhea 11/07/2012   Minocycline Other (See Comments) 11/08/2012   Naproxen sodium  06/22/2010   Rofecoxib  06/22/2010   Vibramycin [doxycycline calcium] Other (See Comments) 11/07/2012   Mobic [meloxicam] Rash 11/07/2012    ROS:  General: Negative for anorexia, weight loss, fever, chills, fatigue, weakness. ENT: Negative for hoarseness, difficulty swallowing , nasal congestion. CV: Negative for chest pain, angina, palpitations, dyspnea on exertion, peripheral edema.  Respiratory: Negative for dyspnea at rest, dyspnea on exertion, cough, sputum, wheezing.  GI: See history of present illness. GU:  Negative for dysuria, hematuria, urinary incontinence, urinary frequency, nocturnal urination.  Endo: Negative for unusual weight change.    Physical Examination:   BP (!) 169/75   Pulse 69   Temp 98.3 F (36.8 C) (Oral)   Ht 5' (1.524 m)   Wt 105 lb 3.2 oz (47.7 kg)   LMP 11/09/1979   BMI 20.55 kg/m   General: Well-nourished, well-developed in no acute distress.  Eyes: No icterus. Conjunctivae pink. Mouth: Oropharyngeal mucosa moist and pink , no lesions erythema or exudate.                                                                                                                                            Neuro: Alert and oriented x 3.  Grossly intact. Skin: Warm and dry, no jaundice.   Psych:  Alert and cooperative, normal mood and affect.   Imaging Studies: CT Abdomen Pelvis Wo Contrast  Result Date: 04/16/2021 CLINICAL DATA:  Unintended weight loss mid abdominal pain EXAM: CT ABDOMEN AND PELVIS WITHOUT CONTRAST TECHNIQUE: Multidetector CT imaging of the abdomen and pelvis was performed following the standard  protocol without IV contrast. COMPARISON:  CT 07/21/2018 FINDINGS: Lower chest: Lung bases demonstrate no acute consolidation or effusion. Minimal scarring at the central right lung base. Normal cardiac size Hepatobiliary: No focal liver abnormality is seen. No gallstones, gallbladder wall thickening, or biliary dilatation. Pancreas: Unremarkable. No pancreatic ductal dilatation or surrounding inflammatory changes. Spleen: Normal in size without focal abnormality. Adrenals/Urinary Tract: Adrenal glands are unremarkable. Kidneys are normal, without renal calculi, focal lesion, or hydronephrosis. Bladder is unremarkable. Stomach/Bowel: Wall thickening at the gastric fundus and cardia measuring up to 3.1 cm. No dilated small bowel. Segment of thickened small bowel in the right lower quadrant, series 2, image 44. No associated inflammatory changes. Small bowel diverticula in the region. Diverticular disease of the colon most evident at the sigmoid colon. Vascular/Lymphatic: Moderate aortic atherosclerosis. No aneurysm. No suspicious nodes. Reproductive: Status post hysterectomy. No adnexal masses. Other: Negative for free air or free fluid Musculoskeletal: No acute or significant osseous findings. IMPRESSION: 1. Suspicion of gastric wall thickening the fundus and cardia, indeterminate for gastritis or mass, correlation with endoscopy may be performed as indicated. 2. Segment of thickened appearing small bowel in the right lower quadrant without much inflammatory change. Patient noted to have small bowel diverticula. Differential considerations include wall thickening secondary to diverticulitis,  focal enteritis secondary to infection or inflammatory bowel disease, and wall thickening secondary to a mass. There are no obstructive features at this time. 3. Diverticular disease of the colon without acute wall thickening. Electronically Signed   By: Donavan Foil M.D.   On: 04/16/2021 23:13    Assessment and Plan:   Teresa Wade is a 87 y.o. y/o female   with a prior history of  Extensive evaluations/treatments   by [facility] and [facility]  for lymphocytic colitis, bacterial overgrowth syndrome, has had a negative gastric emptying study.  She has a chronic history of nausea vomiting abdominal discomfort.  Functional disorders has been entertained as well in the past.  She is here to see me as a third gastroenterologist.  It is possible that she may have episodic gastroparesis.  Since her last visit she has had 17 pounds of unintentional weight loss with worsening of her lower abdominal pain.  Reglan has helped her but obviously she cannot take it long-term.  I will change her PPI from omeprazole to Dexilant to see if it helps her better add Carafate 4 times a day.  We did discuss about a colonoscopy to evaluate the inflammation seen in the CAT scan she is not too keen at this point of time but we will touch base next visit in 3 to 4 weeks again.  Another option at her next visit is to use medication such as amitriptyline as a pain modulator if above has not helped     Dr Jonathon Bellows  MD,MRCP [facility]) Follow up in 3 to 4 weeks office or video visit

## 2021-05-16 ENCOUNTER — Other Ambulatory Visit: Payer: Self-pay

## 2021-05-16 ENCOUNTER — Emergency Department: Payer: Medicare Other

## 2021-05-16 ENCOUNTER — Emergency Department
Admission: EM | Admit: 2021-05-16 | Discharge: 2021-05-16 | Disposition: A | Discharge status: Home / Self Care | Payer: Medicare Other | Attending: Emergency Medicine | Admitting: Emergency Medicine

## 2021-05-16 DIAGNOSIS — I10 Essential (primary) hypertension: Secondary | ICD-10-CM | POA: Diagnosis not present

## 2021-05-16 DIAGNOSIS — E119 Type 2 diabetes mellitus without complications: Secondary | ICD-10-CM | POA: Insufficient documentation

## 2021-05-16 DIAGNOSIS — Z79899 Other long term (current) drug therapy: Secondary | ICD-10-CM | POA: Diagnosis not present

## 2021-05-16 DIAGNOSIS — Z87891 Personal history of nicotine dependence: Secondary | ICD-10-CM | POA: Diagnosis not present

## 2021-05-16 DIAGNOSIS — K2950 Unspecified chronic gastritis without bleeding: Secondary | ICD-10-CM | POA: Diagnosis not present

## 2021-05-16 DIAGNOSIS — R1013 Epigastric pain: Secondary | ICD-10-CM | POA: Diagnosis present

## 2021-05-16 DIAGNOSIS — K219 Gastro-esophageal reflux disease without esophagitis: Secondary | ICD-10-CM | POA: Insufficient documentation

## 2021-05-16 DIAGNOSIS — Z7982 Long term (current) use of aspirin: Secondary | ICD-10-CM | POA: Insufficient documentation

## 2021-05-16 LAB — CBC WITH DIFFERENTIAL/PLATELET
Abs Immature Granulocytes: 0.05 10*3/uL (ref 0.00–0.07)
Basophils Absolute: 0.1 10*3/uL (ref 0.0–0.1)
Basophils Relative: 0 %
Eosinophils Absolute: 0.1 10*3/uL (ref 0.0–0.5)
Eosinophils Relative: 0 %
HCT: 41.4 % (ref 36.0–46.0)
Hemoglobin: 14.6 g/dL (ref 12.0–15.0)
Immature Granulocytes: 0 %
Lymphocytes Relative: 16 %
Lymphs Abs: 2.5 10*3/uL (ref 0.7–4.0)
MCH: 30.7 pg (ref 26.0–34.0)
MCHC: 35.3 g/dL (ref 30.0–36.0)
MCV: 87.2 fL (ref 80.0–100.0)
Monocytes Absolute: 0.7 10*3/uL (ref 0.1–1.0)
Monocytes Relative: 4 %
Neutro Abs: 12.6 10*3/uL — ABNORMAL HIGH (ref 1.7–7.7)
Neutrophils Relative %: 80 %
Platelets: 304 10*3/uL (ref 150–400)
RBC: 4.75 MIL/uL (ref 3.87–5.11)
RDW: 12.5 % (ref 11.5–15.5)
WBC: 15.9 10*3/uL — ABNORMAL HIGH (ref 4.0–10.5)
nRBC: 0 % (ref 0.0–0.2)

## 2021-05-16 LAB — COMPREHENSIVE METABOLIC PANEL
ALT: 13 U/L (ref 0–44)
AST: 22 U/L (ref 15–41)
Albumin: 3.9 g/dL (ref 3.5–5.0)
Alkaline Phosphatase: 74 U/L (ref 38–126)
Anion gap: 9 (ref 5–15)
BUN: 24 mg/dL — ABNORMAL HIGH (ref 8–23)
CO2: 24 mmol/L (ref 22–32)
Calcium: 9.5 mg/dL (ref 8.9–10.3)
Chloride: 102 mmol/L (ref 98–111)
Creatinine, Ser: 0.9 mg/dL (ref 0.44–1.00)
GFR, Estimated: >60 mL/min (ref >60)
Glucose, Bld: 181 mg/dL — ABNORMAL HIGH (ref 70–99)
Potassium: 3.8 mmol/L (ref 3.5–5.1)
Sodium: 135 mmol/L (ref 135–145)
Total Bilirubin: 0.8 mg/dL (ref 0.3–1.2)
Total Protein: 7.5 g/dL (ref 6.5–8.1)

## 2021-05-16 LAB — TROPONIN I (HIGH SENSITIVITY)
Troponin I (High Sensitivity): 9 ng/L (ref <18)
Troponin I (High Sensitivity): 13 ng/L (ref <18)

## 2021-05-16 LAB — URINALYSIS, COMPLETE (UACMP) WITH MICROSCOPIC
Bacteria, UA: NONE SEEN
Bilirubin Urine: NEGATIVE
Glucose, UA: NEGATIVE mg/dL
Hgb urine dipstick: NEGATIVE
Ketones, ur: 5 mg/dL — AB
Leukocytes,Ua: NEGATIVE
Nitrite: NEGATIVE
Protein, ur: NEGATIVE mg/dL
Specific Gravity, Urine: 1.041 — ABNORMAL HIGH (ref 1.005–1.030)
pH: 7 (ref 5.0–8.0)

## 2021-05-16 LAB — LIPASE, BLOOD: Lipase: 26 U/L (ref 11–51)

## 2021-05-16 MED ORDER — ONDANSETRON HCL 4 MG/2ML IJ SOLN
INTRAMUSCULAR | Status: AC
  Administered 2021-05-16: 4 mg via INTRAVENOUS
  Filled 2021-05-16: qty 2

## 2021-05-16 MED ORDER — SUCRALFATE 1 GM/10ML PO SUSP
1.0000 g | Freq: Three times a day (TID) | ORAL | Status: DC
  Filled 2021-05-16 (×3): qty 10

## 2021-05-16 MED ORDER — ONDANSETRON HCL 4 MG/2ML IJ SOLN: 4.0000 mg | Freq: Once | INTRAMUSCULAR | Status: AC

## 2021-05-16 MED ORDER — LIDOCAINE VISCOUS HCL 2 % MT SOLN
15.0000 mL | Freq: Once | OROMUCOSAL | Status: AC
  Administered 2021-05-16: 15 mL via ORAL
  Filled 2021-05-16: qty 15

## 2021-05-16 MED ORDER — SODIUM CHLORIDE 0.9 % IV BOLUS
1000.0000 mL | Freq: Once | INTRAVENOUS | Status: AC
  Administered 2021-05-16: 1000 mL via INTRAVENOUS

## 2021-05-16 MED ORDER — FAMOTIDINE IN NACL 20-0.9 MG/50ML-% IV SOLN
20.0000 mg | Freq: Once | INTRAVENOUS | Status: AC
  Administered 2021-05-16: 20 mg via INTRAVENOUS
  Filled 2021-05-16: qty 50

## 2021-05-16 MED ORDER — ONDANSETRON 8 MG PO TBDP: 8.0000 mg | ORAL_TABLET | Freq: Three times a day (TID) | ORAL | 0 refills | Status: DC | PRN

## 2021-05-16 MED ORDER — ALUM & MAG HYDROXIDE-SIMETH 200-200-20 MG/5ML PO SUSP
30.0000 mL | Freq: Once | ORAL | Status: AC
  Administered 2021-05-16: 30 mL via ORAL
  Filled 2021-05-16: qty 30

## 2021-05-16 MED ORDER — IOHEXOL 300 MG/ML  SOLN
100.0000 mL | Freq: Once | INTRAMUSCULAR | Status: AC | PRN
  Administered 2021-05-16: 100 mL via INTRAVENOUS

## 2021-05-16 NOTE — ED Triage Notes (Signed)
Pt presents to ER via [facility] from home c/o nausea and vomiting since 2230 yesterday.  Pt has hx of gastric ulcers.  Pt denies blood in her emesis.  Pt denies blood in her stool as well.  Pt took reglan pta in ER.  Pt A&O x4 at this time with one episode of emesis on arrival to ER.

## 2021-05-16 NOTE — ED Provider Notes (Signed)
[facility] Emergency Department Provider Note   ____________________________________________   Event Date/Time   First MD Initiated Contact with Patient 05/16/21 [phone]     (approximate)  I have reviewed the triage vital signs and the nursing notes.   HISTORY  Chief Complaint Emesis and Abdominal Pain    HPI Teresa Wade is a 87 y.o. female with below stated past medical history who presents for nausea/vomiting/epigastric abdominal pain.  Patient was recently diagnosed with gastric ulcers after having intermittent epigastric abdominal pain with associated nausea and vomiting over the last 6 years.  Patient states that the symptoms have been worsening over the last 2 days.  Patient was seen prior to arrival yesterday and given a PPI as well as sucralfate that patient states has not alleviated her symptoms.  Patient describes burning, epigastric, nonradiating, "10/10" abdominal pain that is worsened with vomiting.  Patient currently denies any vision changes, tinnitus, difficulty speaking, facial droop, sore throat, chest pain, shortness of breath, diarrhea, dysuria, or weakness/numbness/paresthesias in any extremity         Past Medical History:  Diagnosis Date   Abnormal chest CT    right hilar cyst   Anxiety    Fibrocystic disease of breast    GERD (gastroesophageal reflux disease)    History of thrombocytopenia    Hypercholesterolemia    Hyperglycemia    Hypertension    Osteoarthritis    Osteoporosis    s/p Actonel, Reclast (last 2011)   Palpitations    Pre-diabetes    Psoriasis    Reflux esophagitis     Patient Active Problem List   Diagnosis Date Noted   Weight loss 03/22/2021   Carotid stenosis 03/22/2021   Knee pain, left 01/25/2021   Thyroid nodule 01/25/2021   Sciatica, left side 12/23/2020   Dextroscoliosis of lumbosacral spine 12/23/2020   Fatigue 08/08/2020   Rash 07/01/2019   Lymphocytic colitis 06/28/2018   Abdominal  pain 01/12/2018   Chronic venous insufficiency 02/28/2017   Varicose veins of leg with pain, left 12/31/2016   Nausea 12/08/2016   Back pain 12/08/2016   Hoarseness 07/16/2015   Health care maintenance 06/29/2015   Hyperglycemia 03/23/2014   History of colonic polyps 12/02/2013   GERD (gastroesophageal reflux disease) 11/08/2012   Osteoporosis 11/08/2012   Diabetes ([place]) 11/08/2012   Hypercholesterolemia 06/22/2010   Essential hypertension 06/22/2010    Past Surgical History:  Procedure Laterality Date   ABDOMINAL HYSTERECTOMY  1980   BREAST EXCISIONAL BIOPSY Left 1976   benign   COLONOSCOPY WITH PROPOFOL N/A 05/05/2018   Procedure: COLONOSCOPY WITH PROPOFOL;  Surgeon: Manya Silvas, MD;  Location: [facility] ENDOSCOPY;  Service: Endoscopy;  Laterality: N/A;   ESOPHAGOGASTRODUODENOSCOPY (EGD) WITH PROPOFOL N/A 05/05/2018   Procedure: ESOPHAGOGASTRODUODENOSCOPY (EGD) WITH PROPOFOL;  Surgeon: Manya Silvas, MD;  Location: [facility] ENDOSCOPY;  Service: Endoscopy;  Laterality: N/A;   ESOPHAGOGASTRODUODENOSCOPY (EGD) WITH PROPOFOL N/A 04/16/2021   Procedure: ESOPHAGOGASTRODUODENOSCOPY (EGD) WITH PROPOFOL;  Surgeon: Jonathon Bellows, MD;  Location: [facility] ENDOSCOPY;  Service: Gastroenterology;  Laterality: N/A;   THUMB AMPUTATION Left 2015    Prior to Admission medications   Medication Sig Start Date End Date Taking? Authorizing Provider  ondansetron (ZOFRAN ODT) 8 MG disintegrating tablet Take 1 tablet (8 mg total) by mouth every 8 (eight) hours as needed for nausea or vomiting. 05/16/21  Yes Arta Silence, MD  acebutolol (SECTRAL) 200 MG capsule TAKE 2 CAPSULES BY MOUTH EVERY MORNING & 1 CAPSULE EVERY EVENING 06/18/20  Einar Pheasant, MD  amLODipine (NORVASC) 5 MG tablet TAKE 1 TABLET BY MOUTH EVERY DAY 03/10/21   Einar Pheasant, MD  aspirin 81 MG tablet Take 81 mg by mouth daily.    [provider]  calcium carbonate (OS-CAL) 1250 MG chewable tablet Chew 1 tablet by mouth 2 (two)  times daily.    [provider]  calcium elemental as carbonate (BARIATRIC TUMS ULTRA) 400 MG chewable tablet Chew by mouth.    [provider]  Cholecalciferol (VITAMIN D3) 125 MCG (5000 UT) CAPS Take by mouth.    [provider]  clobetasol ointment (TEMOVATE) 5.32 % Apply 1 application topically 2 (two) times daily.    [provider]  dexlansoprazole (DEXILANT) 60 MG capsule Take 1 capsule (60 mg total) by mouth daily. 05/14/21   Jonathon Bellows, MD  losartan (COZAAR) 100 MG tablet TAKE 1 TABLET BY MOUTH EVERY DAY 12/17/20   Einar Pheasant, MD  magnesium oxide (MAG-OX) 400 MG tablet Take 400 mg by mouth daily.    [provider]  metoCLOPramide (REGLAN) 5 MG tablet Take 1 tablet (5 mg total) by mouth every 8 (eight) hours as needed for nausea or vomiting. 05/14/21 11/14/21  Jonathon Bellows, MD  nitroGLYCERIN (NITROSTAT) 0.4 MG SL tablet Place 1 tablet (0.4 mg total) under the tongue every 5 (five) minutes as needed for chest pain. May repeat x 1.  If persistent pain, call 911 Patient taking differently: Place 0.4 mg under the tongue every 5 (five) minutes as needed for chest pain. May repeat x 1.  If persistent pain, call 911 03/11/15   Einar Pheasant, MD  simvastatin (ZOCOR) 40 MG tablet TAKE 1 TABLET BY MOUTH EVERYDAY AT BEDTIME 12/17/20   Einar Pheasant, MD  sucralfate (CARAFATE) 1 GM/10ML suspension Take 10 mLs (1 g total) by mouth 4 (four) times daily. 05/14/21 02/08/22  Jonathon Bellows, MD  triamcinolone cream (KENALOG) 0.1 % Apply 1 application topically 2 (two) times daily. 06/27/19   Einar Pheasant, MD  vitamin B-12 (CYANOCOBALAMIN) 1000 MCG tablet Take 1,000 mcg by mouth daily.    [provider]    Allergies Amoxicillin-pot clavulanate, Clarithromycin, Lodine [etodolac], Minocycline, Naproxen sodium, Rofecoxib, Vibramycin [doxycycline calcium], and Mobic [meloxicam]  Family History  Problem Relation Age of Onset   Heart disease Mother     Hypertension Mother    Mental illness Other        sibling, suicide   Colon cancer Sister    Sleep apnea Brother    Breast cancer Neg Hx     Social History Social History   Tobacco Use   Smoking status: Former    Pack years: 0.00    Types: Cigarettes    Quit date: 1999    Years since quitting: 23.4   Smokeless tobacco: Never  Vaping Use   Vaping Use: Never used  Substance Use Topics   Alcohol use: No    Alcohol/week: 0.0 standard drinks   Drug use: No    Review of Systems Constitutional: No fever/chills Eyes: No visual changes. ENT: No sore throat. Cardiovascular: Denies chest pain. Respiratory: Denies shortness of breath. Gastrointestinal: Endorses midepigastric abdominal pain, nausea, and vomiting.  No diarrhea. Genitourinary: Negative for dysuria. Musculoskeletal: Negative for acute arthralgias Skin: Negative for rash. Neurological: Negative for headaches, weakness/numbness/paresthesias in any extremity Psychiatric: Negative for suicidal ideation/homicidal ideation   ____________________________________________   PHYSICAL EXAM:  VITAL SIGNS: ED Triage Vitals  Enc Vitals Group     BP 05/16/21 0435 Marland Kitchen)  206/117     Pulse Rate 05/16/21 0435 (!) 103     Resp 05/16/21 0435 18     Temp 05/16/21 0441 98.7 F (37.1 C)     Temp Source 05/16/21 0441 Oral     SpO2 05/16/21 0435 97 %     Weight --      Height --      Head Circumference --      Peak Flow --      Pain Score 05/16/21 0438 10     Pain Loc --      Pain Edu? --      Excl. in [place]? --    Constitutional: Alert and oriented. Well appearing and in no acute distress. Eyes: Conjunctivae are normal. PERRL. Head: Atraumatic. Nose: No congestion/rhinnorhea. Mouth/Throat: Mucous membranes are moist. Neck: No stridor Cardiovascular: Grossly normal heart sounds.  Good peripheral circulation. Respiratory: Normal respiratory effort.  No retractions. Gastrointestinal: Soft and nontender. No  distention. Musculoskeletal: No obvious deformities Neurologic:  Normal speech and language. No gross focal neurologic deficits are appreciated. Skin:  Skin is warm and dry. No rash noted. Psychiatric: Mood and affect are normal. Speech and behavior are normal.  ____________________________________________   LABS (all labs ordered are listed, but only abnormal results are displayed)  Labs Reviewed  CBC WITH DIFFERENTIAL/PLATELET - Abnormal; Notable for the following components:      Result Value   WBC 15.9 (*)    Neutro Abs 12.6 (*)    All other components within normal limits  COMPREHENSIVE METABOLIC PANEL - Abnormal; Notable for the following components:   Glucose, Bld 181 (*)    BUN 24 (*)    All other components within normal limits  URINALYSIS, COMPLETE (UACMP) WITH MICROSCOPIC - Abnormal; Notable for the following components:   Color, Urine STRAW (*)    APPearance CLEAR (*)    Specific Gravity, Urine 1.041 (*)    Ketones, ur 5 (*)    All other components within normal limits  LIPASE, BLOOD  TROPONIN I (HIGH SENSITIVITY)  TROPONIN I (HIGH SENSITIVITY)    RADIOLOGY  ED MD interpretation: CT of the abdomen and pelvis with contrast pending  Official radiology report(s): No results found.  ____________________________________________   PROCEDURES  Procedure(s) performed (including Critical Care):  Procedures   ____________________________________________   INITIAL IMPRESSION / ASSESSMENT AND PLAN / ED COURSE  As part of my medical decision making, I reviewed the following data within the [place] notes reviewed and incorporated, Labs reviewed, EKG interpreted, Old chart reviewed, Radiograph reviewed and Notes from prior ED visits reviewed and incorporated        Patients symptoms not typical for emergent causes of abdominal pain such as, but not limited to, appendicitis, abdominal aortic aneurysm, surgical biliary disease,  pancreatitis, SBO, mesenteric ischemia, serious intra-abdominal bacterial illness. Presentation also not typical of gynecologic emergencies such as TOA, Ovarian Torsion, PID. Not Ectopic. Doubt atypical ACS.  Pt not tolerating PO. Disposition: Care of this patient will be signed out to the oncoming physician at the end of my shift.  All pertinent patient information conveyed and all questions answered.  All further care and disposition decisions will be made by the oncoming physician.      ____________________________________________   FINAL CLINICAL IMPRESSION(S) / ED DIAGNOSES  Final diagnoses:  Chronic gastritis without bleeding, unspecified gastritis type     ED Discharge Orders          Ordered    ondansetron (  ZOFRAN ODT) 8 MG disintegrating tablet  Every 8 hours PRN        05/16/21 1100             Note:  This document was prepared using Dragon voice recognition software and may include unintentional dictation errors.    Naaman Plummer, MD 05/18/21 1314

## 2021-05-16 NOTE — ED Notes (Signed)
Patient transported to CT 

## 2021-05-16 NOTE — Discharge Instructions (Addendum)
We have prescribed Zofran (ondansetron) for nausea.  You can try taking this instead of the Reglan (metoclopramide) that you were prescribed the other day.  Continue to take your other medications that you are prescribed including the Dexilant and sucralfate.  Return to the ER for new, worsening, or persistent severe nausea, vomiting, pain, fever, or any other new or worsening symptoms that concern you.

## 2021-05-16 NOTE — ED Provider Notes (Signed)
-----------------------------------------   11:04 AM on 05/16/2021 -----------------------------------------  I took over care on this patient from Dr. Gardner Candle.  Her CT shows no acute abnormality.  The lab work-up is also unremarkable except for leukocytosis.  Urinalysis shows no evidence of UTI.  The patient is afebrile and overall well-appearing.  Overall presentation is consistent with gastritis.  The patient states that her symptoms have improved but not totally resolved.  She appears somewhat upset that we have not solved her problem and states that she wants to go home.  I explained to her the results of the work-up and plan of care.  I suggested that we do a p.o. challenge and give her a dose of her liquid Carafate which she was unable to tolerate last night.  If she can continue to take this and her other medications, it should help with the gastritis symptoms.  She initially agreed to this plan, however now she wants to leave.  We will still give her the Carafate before she does.  I gave her thorough return precautions and answered all of her questions.  I have also prescribed Zofran to try instead of the Reglan that she was on for nausea which she said was not helping.   Arta Silence, MD 05/16/21 1106

## 2021-05-19 ENCOUNTER — Other Ambulatory Visit: Payer: Self-pay

## 2021-05-19 ENCOUNTER — Other Ambulatory Visit (INDEPENDENT_AMBULATORY_CARE_PROVIDER_SITE_OTHER): Payer: Medicare Other

## 2021-05-19 DIAGNOSIS — E041 Nontoxic single thyroid nodule: Secondary | ICD-10-CM | POA: Diagnosis not present

## 2021-05-19 DIAGNOSIS — E1165 Type 2 diabetes mellitus with hyperglycemia: Secondary | ICD-10-CM | POA: Diagnosis not present

## 2021-05-19 DIAGNOSIS — E7800 Pure hypercholesterolemia, unspecified: Secondary | ICD-10-CM | POA: Diagnosis not present

## 2021-05-19 DIAGNOSIS — I10 Essential (primary) hypertension: Secondary | ICD-10-CM

## 2021-05-19 LAB — BASIC METABOLIC PANEL
BUN: 16 mg/dL (ref 6–23)
CO2: 29 mEq/L (ref 19–32)
Calcium: 9.7 mg/dL (ref 8.4–10.5)
Chloride: 101 mEq/L (ref 96–112)
Creatinine, Ser: 0.83 mg/dL (ref 0.40–1.20)
GFR: 63.41 mL/min (ref >60.00)
Glucose, Bld: 98 mg/dL (ref 70–99)
Potassium: 4.1 mEq/L (ref 3.5–5.1)
Sodium: 138 mEq/L (ref 135–145)

## 2021-05-19 LAB — TSH: TSH: 1.16 u[IU]/mL (ref 0.35–4.50)

## 2021-05-19 LAB — HEPATIC FUNCTION PANEL
ALT: 10 U/L (ref 0–35)
AST: 16 U/L (ref 0–37)
Albumin: 3.8 g/dL (ref 3.5–5.2)
Alkaline Phosphatase: 74 U/L (ref 39–117)
Bilirubin, Direct: 0.1 mg/dL (ref 0.0–0.3)
Total Bilirubin: 0.6 mg/dL (ref 0.2–1.2)
Total Protein: 6.3 g/dL (ref 6.0–8.3)

## 2021-05-19 LAB — T4, FREE: Free T4: 1.17 ng/dL (ref 0.60–1.60)

## 2021-05-19 LAB — LIPID PANEL
Cholesterol: 157 mg/dL (ref 0–200)
HDL: 48.3 mg/dL (ref >39.00)
LDL Cholesterol: 86 mg/dL (ref 0–99)
NonHDL: 108.6
Total CHOL/HDL Ratio: 3
Triglycerides: 114 mg/dL (ref 0.0–149.0)
VLDL: 22.8 mg/dL (ref 0.0–40.0)

## 2021-05-19 LAB — HEMOGLOBIN A1C: Hgb A1c MFr Bld: 6.3 % (ref 4.6–6.5)

## 2021-05-21 ENCOUNTER — Encounter: Payer: Self-pay | Admitting: Internal Medicine

## 2021-05-21 ENCOUNTER — Telehealth: Payer: Self-pay | Admitting: Gastroenterology

## 2021-05-21 ENCOUNTER — Other Ambulatory Visit: Payer: Self-pay

## 2021-05-21 ENCOUNTER — Ambulatory Visit: Payer: Medicare Other | Admitting: Internal Medicine

## 2021-05-21 DIAGNOSIS — K219 Gastro-esophageal reflux disease without esophagitis: Secondary | ICD-10-CM

## 2021-05-21 DIAGNOSIS — K52832 Lymphocytic colitis: Secondary | ICD-10-CM | POA: Diagnosis not present

## 2021-05-21 DIAGNOSIS — I779 Disorder of arteries and arterioles, unspecified: Secondary | ICD-10-CM | POA: Diagnosis not present

## 2021-05-21 DIAGNOSIS — I10 Essential (primary) hypertension: Secondary | ICD-10-CM

## 2021-05-21 DIAGNOSIS — E041 Nontoxic single thyroid nodule: Secondary | ICD-10-CM

## 2021-05-21 DIAGNOSIS — E7800 Pure hypercholesterolemia, unspecified: Secondary | ICD-10-CM

## 2021-05-21 DIAGNOSIS — R634 Abnormal weight loss: Secondary | ICD-10-CM

## 2021-05-21 DIAGNOSIS — Z860100 Personal history of colon polyps, unspecified: Secondary | ICD-10-CM

## 2021-05-21 DIAGNOSIS — E1165 Type 2 diabetes mellitus with hyperglycemia: Secondary | ICD-10-CM

## 2021-05-21 MED ORDER — MIRTAZAPINE 7.5 MG PO TABS
7.5000 mg | ORAL_TABLET | Freq: Every day | ORAL | 1 refills | Status: DC
Start: 2021-05-21 — End: 2021-09-08

## 2021-05-21 NOTE — Progress Notes (Signed)
Patient ID: Teresa Wade, female   DOB: 09/16/1932, 87 y.o.   MRN: [id number]   Subjective:    Patient ID: Teresa Wade, female    DOB: 1932/02/19, 87 y.o.   MRN: [id number]  HPI This visit occurred during the SARS-CoV-2 public health emergency.  Safety protocols were in place, including screening questions prior to the visit, additional usage of staff PPE, and extensive cleaning of exam room while observing appropriate contact time as indicated for disinfecting solutions.   Patient here for a scheduled follow up.  Here to follow up regarding persistent abdominal pain, nausea, etc.  Seeing GI.  Has had extensive GI w/up.  Just evaluated 05/14/21.  Started on reglan in 10/2020.  Feels may have helped some. PPI changed.  On nexium now.  Prescribed carafate.  Developed some constipation.  Took a suppository yesterday.  Discussed miralax or fiber - to keep bowels regular.  No increased abdominal pain currently.  Decreased appetite.  Has continued to lose weight.  No chest pain.  Breathing stable.  Increased stress related to her current GI issues.    Past Medical History:  Diagnosis Date   Abnormal chest CT    right hilar cyst   Anxiety    Fibrocystic disease of breast    GERD (gastroesophageal reflux disease)    History of thrombocytopenia    Hypercholesterolemia    Hyperglycemia    Hypertension    Osteoarthritis    Osteoporosis    s/p Actonel, Reclast (last 2011)   Palpitations    Pre-diabetes    Psoriasis    Reflux esophagitis    Past Surgical History:  Procedure Laterality Date   ABDOMINAL HYSTERECTOMY  1980   BREAST EXCISIONAL BIOPSY Left 1976   benign   COLONOSCOPY WITH PROPOFOL N/A 05/05/2018   Procedure: COLONOSCOPY WITH PROPOFOL;  Surgeon: Manya Silvas, MD;  Location: [facility] ENDOSCOPY;  Service: Endoscopy;  Laterality: N/A;   ESOPHAGOGASTRODUODENOSCOPY (EGD) WITH PROPOFOL N/A 05/05/2018   Procedure: ESOPHAGOGASTRODUODENOSCOPY (EGD) WITH PROPOFOL;  Surgeon: Manya Silvas, MD;  Location: [facility] ENDOSCOPY;  Service: Endoscopy;  Laterality: N/A;   ESOPHAGOGASTRODUODENOSCOPY (EGD) WITH PROPOFOL N/A 04/16/2021   Procedure: ESOPHAGOGASTRODUODENOSCOPY (EGD) WITH PROPOFOL;  Surgeon: Jonathon Bellows, MD;  Location: [facility] ENDOSCOPY;  Service: Gastroenterology;  Laterality: N/A;   THUMB AMPUTATION Left 2015   Family History  Problem Relation Age of Onset   Heart disease Mother    Hypertension Mother    Colon cancer Sister    Sleep apnea Brother    Mental illness Other        sibling, suicide   Breast cancer Neg Hx    Thyroid disease Neg Hx    Social History   Socioeconomic History   Marital status: Married    Spouse name: Not on file   Number of children: 1   Years of education: Not on file   Highest education level: Not on file  Occupational History   Not on file  Tobacco Use   Smoking status: Former    Pack years: 0.00    Types: Cigarettes    Quit date: 1999    Years since quitting: 23.5   Smokeless tobacco: Never  Vaping Use   Vaping Use: Never used  Substance and Sexual Activity   Alcohol use: No    Alcohol/week: 0.0 standard drinks   Drug use: No   Sexual activity: Never  Other Topics Concern   Not on file  Social History Narrative   Not on  file   Social Determinants of Health   Financial Resource Strain: Not on file  Food Insecurity: Not on file  Transportation Needs: Not on file  Physical Activity: Not on file  Stress: Not on file  Social Connections: Not on file    Review of Systems  Constitutional:  Negative for appetite change and unexpected weight change.  HENT:  Negative for congestion and sinus pressure.   Respiratory:  Negative for cough, chest tightness and shortness of breath.   Cardiovascular:  Negative for chest pain, palpitations and leg swelling.  Gastrointestinal:  Positive for abdominal pain. Negative for vomiting.       Decreased appetite.   Genitourinary:  Negative for difficulty urinating and dysuria.   Musculoskeletal:  Negative for joint swelling and myalgias.  Neurological:  Negative for dizziness, light-headedness and headaches.  Psychiatric/Behavioral:  Negative for agitation and dysphoric mood.        Increased stress as outlined.       Objective:    Physical Exam Vitals reviewed.  Constitutional:      General: She is not in acute distress.    Appearance: Normal appearance.  HENT:     Head: Normocephalic and atraumatic.     Right Ear: External ear normal.     Left Ear: External ear normal.  Eyes:     General: No scleral icterus.       Right eye: No discharge.        Left eye: No discharge.     Conjunctiva/sclera: Conjunctivae normal.  Neck:     Thyroid: No thyromegaly.  Cardiovascular:     Rate and Rhythm: Normal rate and regular rhythm.  Pulmonary:     Effort: No respiratory distress.     Breath sounds: Normal breath sounds. No wheezing.  Abdominal:     General: Bowel sounds are normal.     Palpations: Abdomen is soft.     Tenderness: There is no abdominal tenderness.  Musculoskeletal:        General: No swelling or tenderness.     Cervical back: Neck supple. No tenderness.  Lymphadenopathy:     Cervical: No cervical adenopathy.  Skin:    Findings: No erythema or rash.  Neurological:     Mental Status: She is alert.  Psychiatric:        Mood and Affect: Mood normal.        Behavior: Behavior normal.    BP (!) 142/64 (BP Location: Left Arm, Patient Position: Sitting, Cuff Size: Normal)   Pulse 68   Temp 98.5 F (36.9 C) (Oral)   Ht 5' (1.524 m)   Wt 103 lb 9.6 oz (47 kg)   LMP 11/09/1979   SpO2 95%   BMI 20.23 kg/m  Wt Readings from Last 3 Encounters:  05/26/21 102 lb (46.3 kg)  05/21/21 103 lb 9.6 oz (47 kg)  05/14/21 105 lb 3.2 oz (47.7 kg)    Outpatient Encounter Medications as of 05/21/2021  Medication Sig   acebutolol (SECTRAL) 200 MG capsule TAKE 2 CAPSULES BY MOUTH EVERY MORNING & 1 CAPSULE EVERY EVENING   amLODipine (NORVASC) 5 MG  tablet TAKE 1 TABLET BY MOUTH EVERY DAY   aspirin 81 MG tablet Take 81 mg by mouth daily.   calcium carbonate (OS-CAL) 1250 MG chewable tablet Chew 1 tablet by mouth 2 (two) times daily.   calcium elemental as carbonate (BARIATRIC TUMS ULTRA) 400 MG chewable tablet Chew by mouth.   Cholecalciferol (VITAMIN D3) 125 MCG (5000 UT)  CAPS Take by mouth.   clobetasol ointment (TEMOVATE) 0.37 % Apply 1 application topically 2 (two) times daily.   losartan (COZAAR) 100 MG tablet TAKE 1 TABLET BY MOUTH EVERY DAY   magnesium oxide (MAG-OX) 400 MG tablet Take 400 mg by mouth daily.   metoCLOPramide (REGLAN) 5 MG tablet Take 1 tablet (5 mg total) by mouth every 8 (eight) hours as needed for nausea or vomiting.   mirtazapine (REMERON) 7.5 MG tablet Take 1 tablet (7.5 mg total) by mouth at bedtime.   nitroGLYCERIN (NITROSTAT) 0.4 MG SL tablet Place 1 tablet (0.4 mg total) under the tongue every 5 (five) minutes as needed for chest pain. May repeat x 1.  If persistent pain, call 911 (Patient taking differently: Place 0.4 mg under the tongue every 5 (five) minutes as needed for chest pain. May repeat x 1.  If persistent pain, call 911)   simvastatin (ZOCOR) 40 MG tablet TAKE 1 TABLET BY MOUTH EVERYDAY AT BEDTIME   sucralfate (CARAFATE) 1 GM/10ML suspension Take 10 mLs (1 g total) by mouth 4 (four) times daily.   triamcinolone cream (KENALOG) 0.1 % Apply 1 application topically 2 (two) times daily.   vitamin B-12 (CYANOCOBALAMIN) 1000 MCG tablet Take 1,000 mcg by mouth daily.   [DISCONTINUED] dexlansoprazole (DEXILANT) 60 MG capsule Take 1 capsule (60 mg total) by mouth daily.   [DISCONTINUED] ondansetron (ZOFRAN ODT) 8 MG disintegrating tablet Take 1 tablet (8 mg total) by mouth every 8 (eight) hours as needed for nausea or vomiting.   No facility-administered encounter medications on file as of 05/21/2021.     Lab Results  Component Value Date   WBC 15.9 (H) 05/16/2021   HGB 14.6 05/16/2021   HCT 41.4  05/16/2021   PLT 304 05/16/2021   GLUCOSE 98 05/19/2021   CHOL 157 05/19/2021   TRIG 114.0 05/19/2021   HDL 48.30 05/19/2021   LDLDIRECT 75.0 01/06/2018   LDLCALC 86 05/19/2021   ALT 10 05/19/2021   AST 16 05/19/2021   NA 138 05/19/2021   K 4.1 05/19/2021   CL 101 05/19/2021   CREATININE 0.83 05/19/2021   BUN 16 05/19/2021   CO2 29 05/19/2021   TSH 1.16 05/19/2021   HGBA1C 6.3 05/19/2021   MICROALBUR <0.7 08/27/2020    CT Abdomen Pelvis W Contrast  Result Date: 05/16/2021 CLINICAL DATA:  Nausea and hematemesis beginning yesterday. EXAM: CT ABDOMEN AND PELVIS WITH CONTRAST TECHNIQUE: Multidetector CT imaging of the abdomen and pelvis was performed using the standard protocol following bolus administration of intravenous contrast. CONTRAST:  174mL OMNIPAQUE IOHEXOL 300 MG/ML  SOLN COMPARISON:  Noncontrast CT on 04/15/2021 FINDINGS: Lower Chest: No acute findings. Hepatobiliary: No hepatic masses identified. Gallbladder is unremarkable. No evidence of biliary ductal dilatation. Pancreas:  No mass or inflammatory changes. Spleen: Within normal limits in size and appearance. Adrenals/Urinary Tract: No masses identified. No evidence of ureteral calculi or hydronephrosis. Unremarkable unopacified urinary bladder. Stomach/Bowel: No evidence of obstruction, inflammatory process or abnormal fluid collections. Diverticulosis is seen mainly involving the sigmoid colon, however there is no evidence of diverticulitis. Vascular/Lymphatic: No pathologically enlarged lymph nodes. No acute vascular findings. Aortic atherosclerotic calcification noted. Reproductive: Prior hysterectomy noted. Adnexal regions are unremarkable in appearance. Other:  None. Musculoskeletal:  No suspicious bone lesions identified. IMPRESSION: Colonic diverticulosis, without radiographic evidence of diverticulitis or other acute findings. Aortic Atherosclerosis (ICD10-I70.0). Electronically Signed   By: Marlaine Hind M.D.   On:  05/16/2021 07:58       Assessment &  Plan:   Problem List Items Addressed This Visit     Carotid artery disease ([place])    Had carotid ultrasound 02/2021 - some build up L>R.  No significant stenosis.  Continue risk factor modification.        Diabetes ([place])    Has lost weight unintentionally.  Sugars elevated. Follow a1c.        Essential hypertension    Continue losartan and amlodipine.  Follow pressures.  Follow metabolic panel.        GERD (gastroesophageal reflux disease)    On nexium now.  Follow.        History of colonic polyps    Colonoscopy 04/2018 - mild active colitis.        Hypercholesterolemia    Continue simvastatin.  Follow lipid panel and liver function tests.         Lymphocytic colitis    Previously found on colonoscopy pathology.  Was on budesonide.  Off now.  Seeing GI - Dr Vicente Males.  Taking reglan.  Prescribed carafate.  Continue f/u with GI.         Thyroid nodule    S/p biopsy - pathology - atypia of undetermined significance.  Saw ENT.  Referred to endocrinology.  appt 05/26/21.        Weight loss    Persistent unintentional weight loss.  Seeing GI.  Extensive GI w/up as outlined.  Encourage increased po intake.  Remeron.  Follow           Einar Pheasant, MD

## 2021-05-21 NOTE — Telephone Encounter (Signed)
Dr. Vicente Males, patient started taking Dexilant but today she started to have diarrhea with it. She would like to know what else to try since Omeprazole wasn't helping her much either. Can you pease advise.

## 2021-05-21 NOTE — Telephone Encounter (Signed)
dexlansoprazole (DEXILANT) 60 MG capsule, patient stopped taking it due to giving her diarrhea. What else can she take?

## 2021-05-22 NOTE — Telephone Encounter (Signed)
Jonathon Bellows, MD  You 25 minutes ago (8:22 AM)   [facility]  1. Can try nexium 40 mg BID   Called patient to let her know what Dr. Vicente Males had recommended. Patient stated that she already has Nexium 40 MG at home and therefore, she will take twice a day as recommended by Dr. Vicente Males. Patient is to call us back to let us know if it has helped or not so Dr. Vicente Males could write her a new prescription and hoping her insurance covers it. Patient had no further questions.

## 2021-05-26 ENCOUNTER — Other Ambulatory Visit: Payer: Self-pay

## 2021-05-26 ENCOUNTER — Encounter: Payer: Self-pay | Admitting: Endocrinology

## 2021-05-26 ENCOUNTER — Ambulatory Visit: Payer: Medicare Other | Admitting: Endocrinology

## 2021-05-26 VITALS — BP 154/60 | HR 65 | Ht 59.0 in | Wt 102.0 lb

## 2021-05-26 DIAGNOSIS — E041 Nontoxic single thyroid nodule: Secondary | ICD-10-CM | POA: Diagnosis not present

## 2021-05-26 NOTE — Progress Notes (Signed)
Subjective:    Patient ID: Teresa Wade, female    DOB: 07-23-1932, 87 y.o.   MRN: [id number]  HPI Pt is referred by Dr Nicki Reaper, for nodular thyroid.  Pt was noted to have a thyroid nodule in early 2022.  she is unaware of ever having had thyroid problems in the past.  she has no h/o XRT or surgery to the neck.   Past Medical History:  Diagnosis Date   Abnormal chest CT    right hilar cyst   Anxiety    Fibrocystic disease of breast    GERD (gastroesophageal reflux disease)    History of thrombocytopenia    Hypercholesterolemia    Hyperglycemia    Hypertension    Osteoarthritis    Osteoporosis    s/p Actonel, Reclast (last 2011)   Palpitations    Pre-diabetes    Psoriasis    Reflux esophagitis     Past Surgical History:  Procedure Laterality Date   ABDOMINAL HYSTERECTOMY  1980   BREAST EXCISIONAL BIOPSY Left 1976   benign   COLONOSCOPY WITH PROPOFOL N/A 05/05/2018   Procedure: COLONOSCOPY WITH PROPOFOL;  Surgeon: Manya Silvas, MD;  Location: [facility] ENDOSCOPY;  Service: Endoscopy;  Laterality: N/A;   ESOPHAGOGASTRODUODENOSCOPY (EGD) WITH PROPOFOL N/A 05/05/2018   Procedure: ESOPHAGOGASTRODUODENOSCOPY (EGD) WITH PROPOFOL;  Surgeon: Manya Silvas, MD;  Location: [facility] ENDOSCOPY;  Service: Endoscopy;  Laterality: N/A;   ESOPHAGOGASTRODUODENOSCOPY (EGD) WITH PROPOFOL N/A 04/16/2021   Procedure: ESOPHAGOGASTRODUODENOSCOPY (EGD) WITH PROPOFOL;  Surgeon: Jonathon Bellows, MD;  Location: [facility] ENDOSCOPY;  Service: Gastroenterology;  Laterality: N/A;   THUMB AMPUTATION Left 2015    Social History   Socioeconomic History   Marital status: Married    Spouse name: Not on file   Number of children: 1   Years of education: Not on file   Highest education level: Not on file  Occupational History   Not on file  Tobacco Use   Smoking status: Former    Pack years: 0.00    Types: Cigarettes    Quit date: 1999    Years since quitting: 23.5   Smokeless tobacco: Never  Vaping Use    Vaping Use: Never used  Substance and Sexual Activity   Alcohol use: No    Alcohol/week: 0.0 standard drinks   Drug use: No   Sexual activity: Never  Other Topics Concern   Not on file  Social History Narrative   Not on file   Social Determinants of Health   Financial Resource Strain: Not on file  Food Insecurity: Not on file  Transportation Needs: Not on file  Physical Activity: Not on file  Stress: Not on file  Social Connections: Not on file  Intimate Partner Violence: Not on file    Current Outpatient Medications on File Prior to Visit  Medication Sig Dispense Refill   acebutolol (SECTRAL) 200 MG capsule TAKE 2 CAPSULES BY MOUTH EVERY MORNING & 1 CAPSULE EVERY EVENING 270 capsule 2   amLODipine (NORVASC) 5 MG tablet TAKE 1 TABLET BY MOUTH EVERY DAY 90 tablet 1   aspirin 81 MG tablet Take 81 mg by mouth daily.     calcium carbonate (OS-CAL) 1250 MG chewable tablet Chew 1 tablet by mouth 2 (two) times daily.     calcium elemental as carbonate (BARIATRIC TUMS ULTRA) 400 MG chewable tablet Chew by mouth.     Cholecalciferol (VITAMIN D3) 125 MCG (5000 UT) CAPS Take by mouth.     clobetasol ointment (TEMOVATE) 0.05 %  Apply 1 application topically 2 (two) times daily.     dexlansoprazole (DEXILANT) 60 MG capsule Take 1 capsule (60 mg total) by mouth daily. 30 capsule 0   losartan (COZAAR) 100 MG tablet TAKE 1 TABLET BY MOUTH EVERY DAY 90 tablet 1   magnesium oxide (MAG-OX) 400 MG tablet Take 400 mg by mouth daily.     metoCLOPramide (REGLAN) 5 MG tablet Take 1 tablet (5 mg total) by mouth every 8 (eight) hours as needed for nausea or vomiting. 30 tablet 3   mirtazapine (REMERON) 7.5 MG tablet Take 1 tablet (7.5 mg total) by mouth at bedtime. 30 tablet 1   nitroGLYCERIN (NITROSTAT) 0.4 MG SL tablet Place 1 tablet (0.4 mg total) under the tongue every 5 (five) minutes as needed for chest pain. May repeat x 1.  If persistent pain, call 911 (Patient taking differently: Place 0.4 mg under  the tongue every 5 (five) minutes as needed for chest pain. May repeat x 1.  If persistent pain, call 911) 25 tablet 0   ondansetron (ZOFRAN ODT) 8 MG disintegrating tablet Take 1 tablet (8 mg total) by mouth every 8 (eight) hours as needed for nausea or vomiting. 12 tablet 0   simvastatin (ZOCOR) 40 MG tablet TAKE 1 TABLET BY MOUTH EVERYDAY AT BEDTIME 90 tablet 1   sucralfate (CARAFATE) 1 GM/10ML suspension Take 10 mLs (1 g total) by mouth 4 (four) times daily. 3600 mL 2   triamcinolone cream (KENALOG) 0.1 % Apply 1 application topically 2 (two) times daily. 45 g 0   vitamin B-12 (CYANOCOBALAMIN) 1000 MCG tablet Take 1,000 mcg by mouth daily.     No current facility-administered medications on file prior to visit.    Allergies  Allergen Reactions   Amoxicillin-Pot Clavulanate Other (See Comments)    "6/10" abdominal pain per patient   Clarithromycin    Lodine [Etodolac] Diarrhea   Minocycline Other (See Comments)    Throat swells    Naproxen Sodium     REACTION: Eye Swelling   Rofecoxib     swell   Vibramycin [Doxycycline Calcium] Other (See Comments)    Heart pounds and throat swells   Mobic [Meloxicam] Rash    Family History  Problem Relation Age of Onset   Heart disease Mother    Hypertension Mother    Colon cancer Sister    Sleep apnea Brother    Mental illness Other        sibling, suicide   Breast cancer Neg Hx    Thyroid disease Neg Hx     BP (!) 154/60   Pulse 65   Ht 4\' 11"  (1.499 m)   Wt 102 lb (46.3 kg)   LMP 11/09/1979   SpO2 97%   BMI 20.60 kg/m     Review of Systems Denies hoarseness, neck pain, sob, and flushing.  She has "stomach probs."    Objective:   Physical Exam VITAL SIGNS:  See vs page GENERAL: no distress NECK: There is no palpable thyroid enlargement.  No thyroid nodule is palpable.  No palpable lymphadenopathy at the anterior neck.   Lab Results  Component Value Date   TSH 1.16 05/19/2021   [place]: 1. Nodule 3 (TI-RADS 5) located  in the inferior left thyroid lobe measuring 2.4 x 1.8 x 1.7 cm meets criteria for FNA. 2. Nodules 1 (TI-RADS 4) located in the isthmus, measuring 1.0 x 0.7 x 0.3 cm meets criteria for imaging follow-up with next ultrasound recommended in 1 year.  3. Nodule 2 (TI-RADS 4) located in the superior right thyroid lobe, measuring 1.0 x 0.9 x 0.6 cm meets criteria for imaging follow-up  Cytol: ATYPIA OF UNDETERMINED SIGNIFICANCE (BETHESDA III). Thyroseq: low risk  I have reviewed outside records, and summarized: Pt was noted to have MNG, and referred here.  She had CT to eval otalgia, and goiter was incidentally noted     Assessment & Plan:  MNG, new to me.  We discussed the need for [place] f/u.  Pt requests to have this done by Dr Nicki Reaper. GI sxs: I told pt this these are not thyroid-related  Patient Instructions  You should have the thyroid blood test and ultrasound rechecked in 1 year.   I would be happy to see you back here as needed.

## 2021-05-26 NOTE — Patient Instructions (Addendum)
You should have the thyroid blood test and ultrasound rechecked in 1 year.   I would be happy to see you back here as needed.

## 2021-05-31 ENCOUNTER — Encounter: Payer: Self-pay | Admitting: Internal Medicine

## 2021-05-31 NOTE — Assessment & Plan Note (Signed)
Persistent unintentional weight loss.  Seeing GI.  Extensive GI w/up as outlined.  Encourage increased po intake.  Remeron.  Follow

## 2021-05-31 NOTE — Assessment & Plan Note (Signed)
On nexium now.  Follow.

## 2021-05-31 NOTE — Assessment & Plan Note (Signed)
Previously found on colonoscopy pathology.  Was on budesonide.  Off now.  Seeing GI - Dr Vicente Males.  Taking reglan.  Prescribed carafate.  Continue f/u with GI.

## 2021-05-31 NOTE — Assessment & Plan Note (Addendum)
Has lost weight unintentionally.  Sugars elevated. Follow a1c.

## 2021-05-31 NOTE — Assessment & Plan Note (Signed)
S/p biopsy - pathology - atypia of undetermined significance.  Saw ENT.  Referred to endocrinology.  appt 05/26/21.

## 2021-05-31 NOTE — Assessment & Plan Note (Signed)
Had carotid ultrasound 02/2021 - some build up L>R.  No significant stenosis.  Continue risk factor modification.

## 2021-05-31 NOTE — Assessment & Plan Note (Signed)
Colonoscopy 04/2018 - mild active colitis.

## 2021-05-31 NOTE — Assessment & Plan Note (Signed)
Continue losartan and amlodipine.  Follow pressures.  Follow metabolic panel.  

## 2021-05-31 NOTE — Assessment & Plan Note (Signed)
Continue simvastatin.  Follow lipid panel and liver function tests.

## 2021-06-11 ENCOUNTER — Ambulatory Visit: Payer: Medicare Other

## 2021-06-18 ENCOUNTER — Encounter: Payer: Self-pay | Admitting: Internal Medicine

## 2021-06-18 ENCOUNTER — Other Ambulatory Visit: Payer: Self-pay

## 2021-06-18 ENCOUNTER — Ambulatory Visit: Payer: Medicare Other | Admitting: Internal Medicine

## 2021-06-18 DIAGNOSIS — I10 Essential (primary) hypertension: Secondary | ICD-10-CM | POA: Diagnosis not present

## 2021-06-18 DIAGNOSIS — K219 Gastro-esophageal reflux disease without esophagitis: Secondary | ICD-10-CM | POA: Diagnosis not present

## 2021-06-18 DIAGNOSIS — I779 Disorder of arteries and arterioles, unspecified: Secondary | ICD-10-CM

## 2021-06-18 DIAGNOSIS — E041 Nontoxic single thyroid nodule: Secondary | ICD-10-CM

## 2021-06-18 DIAGNOSIS — E1165 Type 2 diabetes mellitus with hyperglycemia: Secondary | ICD-10-CM | POA: Diagnosis not present

## 2021-06-18 DIAGNOSIS — E7800 Pure hypercholesterolemia, unspecified: Secondary | ICD-10-CM

## 2021-06-18 DIAGNOSIS — I700 Atherosclerosis of aorta: Secondary | ICD-10-CM

## 2021-06-18 DIAGNOSIS — R634 Abnormal weight loss: Secondary | ICD-10-CM

## 2021-06-18 NOTE — Progress Notes (Signed)
Patient ID: AXELLE SZWED, female   DOB: 1932/06/23, 87 y.o.   MRN: [id number]   Subjective:    Patient ID: BERLENE DIXSON, female    DOB: June 17, 1932, 87 y.o.   MRN: [id number]  HPI This visit occurred during the SARS-CoV-2 public health emergency.  Safety protocols were in place, including screening questions prior to the visit, additional usage of staff PPE, and extensive cleaning of exam room while observing appropriate contact time as indicated for disinfecting solutions.   Patient here for a scheduled follow up.  Here to follow up regarding her GI issues and weight loss.  She has been seeing GI for chronic nausea, dyspepsia, abdominal pain and weight loss.   She was seen in ER 05/24/21.  Nexium was stopped.  Was started on prilosec.  Also taking carafate.  She feels she is doing better on this regimen.  Appetite is some better.  Denies any chest pain or sob.  No increased cough or congestion.  No abdominal pain currently.  States other than her GI issues, she feels she is doing relatively well.  Has f/u planned with GI next week. Does use reglan occasionally.  Discussed colonoscopy.  She is hesitant to have a colonoscopy.    Past Medical History:  Diagnosis Date   Abnormal chest CT    right hilar cyst   Anxiety    Fibrocystic disease of breast    GERD (gastroesophageal reflux disease)    History of thrombocytopenia    Hypercholesterolemia    Hyperglycemia    Hypertension    Osteoarthritis    Osteoporosis    s/p Actonel, Reclast (last 2011)   Palpitations    Pre-diabetes    Psoriasis    Reflux esophagitis    Past Surgical History:  Procedure Laterality Date   ABDOMINAL HYSTERECTOMY  1980   BREAST EXCISIONAL BIOPSY Left 1976   benign   COLONOSCOPY WITH PROPOFOL N/A 05/05/2018   Procedure: COLONOSCOPY WITH PROPOFOL;  Surgeon: Manya Silvas, MD;  Location: [facility] ENDOSCOPY;  Service: Endoscopy;  Laterality: N/A;   ESOPHAGOGASTRODUODENOSCOPY (EGD) WITH PROPOFOL N/A 05/05/2018    Procedure: ESOPHAGOGASTRODUODENOSCOPY (EGD) WITH PROPOFOL;  Surgeon: Manya Silvas, MD;  Location: [facility] ENDOSCOPY;  Service: Endoscopy;  Laterality: N/A;   ESOPHAGOGASTRODUODENOSCOPY (EGD) WITH PROPOFOL N/A 04/16/2021   Procedure: ESOPHAGOGASTRODUODENOSCOPY (EGD) WITH PROPOFOL;  Surgeon: Jonathon Bellows, MD;  Location: [facility] ENDOSCOPY;  Service: Gastroenterology;  Laterality: N/A;   THUMB AMPUTATION Left 2015   Family History  Problem Relation Age of Onset   Heart disease Mother    Hypertension Mother    Colon cancer Sister    Sleep apnea Brother    Mental illness Other        sibling, suicide   Breast cancer Neg Hx    Thyroid disease Neg Hx    Social History   Socioeconomic History   Marital status: Married    Spouse name: Not on file   Number of children: 1   Years of education: Not on file   Highest education level: Not on file  Occupational History   Not on file  Tobacco Use   Smoking status: Former    Types: Cigarettes    Quit date: 1999    Years since quitting: 23.5   Smokeless tobacco: Never  Vaping Use   Vaping Use: Never used  Substance and Sexual Activity   Alcohol use: No    Alcohol/week: 0.0 standard drinks   Drug use: No   Sexual activity: Never  Other Topics Concern   Not on file  Social History Narrative   Not on file   Social Determinants of Health   Financial Resource Strain: Not on file  Food Insecurity: Not on file  Transportation Needs: Not on file  Physical Activity: Not on file  Stress: Not on file  Social Connections: Not on file    Review of Systems  Constitutional:        Decreased appetite, but does feel has improved some.  Weight loss as outlined.    HENT:  Negative for congestion and sinus pressure.   Respiratory:  Negative for cough, chest tightness and shortness of breath.   Cardiovascular:  Negative for chest pain, palpitations and leg swelling.  Gastrointestinal:  Negative for abdominal pain, diarrhea, nausea and vomiting.   Genitourinary:  Negative for difficulty urinating and dysuria.  Musculoskeletal:  Negative for joint swelling and myalgias.  Skin:  Negative for color change and rash.  Neurological:  Negative for dizziness, light-headedness and headaches.  Psychiatric/Behavioral:  Negative for agitation and dysphoric mood.       Objective:    Physical Exam Vitals reviewed.  Constitutional:      General: She is not in acute distress.    Appearance: Normal appearance.  HENT:     Head: Normocephalic and atraumatic.     Right Ear: External ear normal.     Left Ear: External ear normal.  Eyes:     General: No scleral icterus.       Right eye: No discharge.        Left eye: No discharge.     Conjunctiva/sclera: Conjunctivae normal.  Neck:     Thyroid: No thyromegaly.  Cardiovascular:     Rate and Rhythm: Normal rate and regular rhythm.  Pulmonary:     Effort: No respiratory distress.     Breath sounds: Normal breath sounds. No wheezing.  Abdominal:     General: Bowel sounds are normal.     Palpations: Abdomen is soft.     Tenderness: There is no abdominal tenderness.  Musculoskeletal:        General: No swelling or tenderness.     Cervical back: Neck supple. No tenderness.  Lymphadenopathy:     Cervical: No cervical adenopathy.  Skin:    Findings: No erythema or rash.  Neurological:     Mental Status: She is alert.  Psychiatric:        Mood and Affect: Mood normal.        Behavior: Behavior normal.    BP 122/60   Pulse 66   Temp 97.8 F (36.6 C)   Ht 4' 11.02" (1.499 m)   Wt 97 lb 12.8 oz (44.4 kg)   LMP 11/09/1979   SpO2 99%   BMI 19.74 kg/m  Wt Readings from Last 3 Encounters:  06/22/21 97 lb 3.2 oz (44.1 kg)  06/18/21 97 lb 12.8 oz (44.4 kg)  05/26/21 102 lb (46.3 kg)    Outpatient Encounter Medications as of 06/18/2021  Medication Sig   aspirin 81 MG tablet Take 81 mg by mouth daily.   calcium carbonate (OS-CAL) 1250 MG chewable tablet Chew 1 tablet by mouth 2 (two)  times daily.   calcium elemental as carbonate (BARIATRIC TUMS ULTRA) 400 MG chewable tablet Chew by mouth.   Cholecalciferol (VITAMIN D3) 125 MCG (5000 UT) CAPS Take by mouth.   clobetasol ointment (TEMOVATE) 4.09 % Apply 1 application topically 2 (two) times daily.   losartan (COZAAR) 100 MG  tablet TAKE 1 TABLET BY MOUTH EVERY DAY   magnesium oxide (MAG-OX) 400 MG tablet Take 400 mg by mouth daily.   metoCLOPramide (REGLAN) 5 MG tablet Take 1 tablet (5 mg total) by mouth every 8 (eight) hours as needed for nausea or vomiting.   mirtazapine (REMERON) 7.5 MG tablet Take 1 tablet (7.5 mg total) by mouth at bedtime.   nitroGLYCERIN (NITROSTAT) 0.4 MG SL tablet Place 1 tablet (0.4 mg total) under the tongue every 5 (five) minutes as needed for chest pain. May repeat x 1.  If persistent pain, call 911 (Patient taking differently: Place 0.4 mg under the tongue every 5 (five) minutes as needed for chest pain. May repeat x 1.  If persistent pain, call 911)   simvastatin (ZOCOR) 40 MG tablet TAKE 1 TABLET BY MOUTH EVERYDAY AT BEDTIME   sucralfate (CARAFATE) 1 GM/10ML suspension Take 10 mLs (1 g total) by mouth 4 (four) times daily.   triamcinolone cream (KENALOG) 0.1 % Apply 1 application topically 2 (two) times daily.   vitamin B-12 (CYANOCOBALAMIN) 1000 MCG tablet Take 1,000 mcg by mouth daily.   [DISCONTINUED] acebutolol (SECTRAL) 200 MG capsule TAKE 2 CAPSULES BY MOUTH EVERY MORNING & 1 CAPSULE EVERY EVENING   [DISCONTINUED] amLODipine (NORVASC) 5 MG tablet TAKE 1 TABLET BY MOUTH EVERY DAY   No facility-administered encounter medications on file as of 06/18/2021.     Lab Results  Component Value Date   WBC 15.9 (H) 05/16/2021   HGB 14.6 05/16/2021   HCT 41.4 05/16/2021   PLT 304 05/16/2021   GLUCOSE 98 05/19/2021   CHOL 157 05/19/2021   TRIG 114.0 05/19/2021   HDL 48.30 05/19/2021   LDLDIRECT 75.0 01/06/2018   LDLCALC 86 05/19/2021   ALT 10 05/19/2021   AST 16 05/19/2021   NA 138  05/19/2021   K 4.1 05/19/2021   CL 101 05/19/2021   CREATININE 0.83 05/19/2021   BUN 16 05/19/2021   CO2 29 05/19/2021   TSH 1.16 05/19/2021   HGBA1C 6.3 05/19/2021   MICROALBUR <0.7 08/27/2020    CT Abdomen Pelvis W Contrast  Result Date: 05/16/2021 CLINICAL DATA:  Nausea and hematemesis beginning yesterday. EXAM: CT ABDOMEN AND PELVIS WITH CONTRAST TECHNIQUE: Multidetector CT imaging of the abdomen and pelvis was performed using the standard protocol following bolus administration of intravenous contrast. CONTRAST:  152m OMNIPAQUE IOHEXOL 300 MG/ML  SOLN COMPARISON:  Noncontrast CT on 04/15/2021 FINDINGS: Lower Chest: No acute findings. Hepatobiliary: No hepatic masses identified. Gallbladder is unremarkable. No evidence of biliary ductal dilatation. Pancreas:  No mass or inflammatory changes. Spleen: Within normal limits in size and appearance. Adrenals/Urinary Tract: No masses identified. No evidence of ureteral calculi or hydronephrosis. Unremarkable unopacified urinary bladder. Stomach/Bowel: No evidence of obstruction, inflammatory process or abnormal fluid collections. Diverticulosis is seen mainly involving the sigmoid colon, however there is no evidence of diverticulitis. Vascular/Lymphatic: No pathologically enlarged lymph nodes. No acute vascular findings. Aortic atherosclerotic calcification noted. Reproductive: Prior hysterectomy noted. Adnexal regions are unremarkable in appearance. Other:  None. Musculoskeletal:  No suspicious bone lesions identified. IMPRESSION: Colonic diverticulosis, without radiographic evidence of diverticulitis or other acute findings. Aortic Atherosclerosis (ICD10-I70.0). Electronically Signed   By: JMarlaine HindM.D.   On: 05/16/2021 07:58       Assessment & Plan:   Problem List Items Addressed This Visit     Aortic atherosclerosis ([place]    Continue simvastatin.        Carotid artery disease ([place]  Had carotid ultrasound 02/2021 - some build up  L>R.  No significant stenosis.  Continue risk factor modification.        Essential hypertension    Continue losartan and amlodipine.  Follow pressures.  Follow metabolic panel.        GERD (gastroesophageal reflux disease)    On prilosec now.  Also takes carafate.  Symptoms have improved some.  Has f/u with GI next week.         Hypercholesterolemia    Continue simvastatin.  Follow lipid panel and liver function tests.         Thyroid nodule    S/p biopsy - pathology - atypia of undetermined significance.  Saw ENT.  Referred to endocrinology - appt 05/26/21.  Do not see notes from visit.  F/u with Ms Siegman regarding need for f/u appt       Type 2 diabetes mellitus with hyperglycemia ([place])    Has lost a lot of weight.  Appetite has improved some.  Follow met b and a1c.        Weight loss    Persistent unintentional weight loss.  Seeing GI.  Has f/u planned next week.  Has had extensive w/up.  On remeron.  May need to adjust the dose.  Appetite has improved some.  Follow.  Schedule f/u soon to reassess.          Einar Pheasant, MD

## 2021-06-22 ENCOUNTER — Other Ambulatory Visit: Payer: Self-pay

## 2021-06-22 ENCOUNTER — Ambulatory Visit: Payer: Medicare Other | Admitting: Gastroenterology

## 2021-06-22 ENCOUNTER — Encounter: Payer: Self-pay | Admitting: Gastroenterology

## 2021-06-22 VITALS — BP 137/74 | HR 69 | Temp 98.1°F | Ht 59.0 in | Wt 97.2 lb

## 2021-06-22 DIAGNOSIS — R634 Abnormal weight loss: Secondary | ICD-10-CM

## 2021-06-22 DIAGNOSIS — R112 Nausea with vomiting, unspecified: Secondary | ICD-10-CM | POA: Diagnosis not present

## 2021-06-22 DIAGNOSIS — R1013 Epigastric pain: Secondary | ICD-10-CM

## 2021-06-22 NOTE — Progress Notes (Signed)
Jonathon Bellows MD, MRCP(U.K) [address]  [place], [place] [address]  Main: [phone]  Fax: [phone]   Primary Care Physician: Einar Pheasant, MD  Primary Gastroenterologist:  Dr. Jonathon Bellows   Follow-up: Chronic nausea and dyspepsia   HPI: Teresa Wade is a 87 y.o. female   Summary of history : Initially referred and seen for nausea, vomiting and abdominal cramping in 10/2020. Previously a [facility] patient.  She states that she has had issues with nausea and vomiting for many years.  Has not been given a clear diagnosis.  She says the vomiting and nausea is usually in the morning and she brings out hold undigested food that she had eaten previously.  Foods which are high in fat content make it worse.  Denies any headaches or imbalance.  Some weight loss.  Denies any diarrhea.  Has 1 bowel movement that is hard every other day.  Takes MiraLAX as needed.  Some abdominal discomfort.   Colonoscopy in 2019 showed sigmoid diverticulosis and biopsy results of the colon suggestive of lymphocytic colitis.  Had a gastric emptying study back then which was normal was seen by [facility] for a second opinion and was scheduled for bacterial breath overgrowth breath test which was subsequently positive in November 2019 and treated with  Keflex and Flagyl, budesonide 3 mg a day and also had completed a course of Xifaxan for 14 days.   Plan at her last visit was addressing her depression or anxiety.  She had requested follow-up with [facility] a DEXA scan was recommended at that time which she wanted to discuss with her doctor. 04/16/2021: CT abdomen and pelvis: Suspicion of gastric wall thickening the fundus and cardia,indeterminate for gastritis or mass, correlation with endoscopy may be performed as indicated.2. Segment of thickened appearing small bowel in the right lower  quadrant without much inflammatory change. Patient noted to have small bowel diverticula.   Differential  considerations include wall thickening secondary to diverticulitis, focal enteritis secondary to infection or inflammatory bowel disease, and wall thickening  secondary to a mass. There are no obstructive features at this time. 3. Diverticular disease of the colon without acute wall thickening  04/16/2021: EGD: Duodenitis - biopsy confirmed  and gastric polyp - fundic gland polyp and gastritis on biopsy . Negative for h pylori .    04/13/2021: Hb 14, Cr 0.80, TSH 0.591    Interval history 05/14/2021-06/22/2021 At her last visit she was given Dexilant but caused her to have diarrhea.  Advised her to stop Dexilant and commenced on Nexium twice daily.  In May 2022 given Reglan for symptomatic control of dyspepsia and seem to have helped her.     05/24/2021 presented to the ER at [facility] for abdominal pain.  Had her Nexium discontinued and commenced on omeprazole twice a day.  She was given a GI cocktail, famotidine, Zofran and some fentanyl.  Possible pyelonephritis was seen on CAT scan along with adjacent stranding around the urinary bladder suggestive of cystitis nonspecific thickening of the gastric antrum.  Enteritis features urinalysis was contaminated with squamous epithelial cells.  Hemoglobin of 14.1 g CMP was normal, lipase normal   She has lost further 8 pounds since her last visit.  She feels better taking the PPI and Carafate 4 times a day.  Her main complaint is she does not have an appetite.    Current Outpatient Medications  Medication Sig Dispense Refill   acebutolol (SECTRAL) 200 MG  capsule TAKE 2 CAPSULES BY MOUTH EVERY MORNING & 1 CAPSULE EVERY EVENING 270 capsule 2   amLODipine (NORVASC) 5 MG tablet TAKE 1 TABLET BY MOUTH EVERY DAY 90 tablet 1   aspirin 81 MG tablet Take 81 mg by mouth daily.     calcium carbonate (OS-CAL) 1250 MG chewable tablet Chew 1 tablet by mouth 2 (two) times daily.     calcium elemental as carbonate (BARIATRIC TUMS ULTRA) 400 MG chewable tablet Chew by  mouth.     Cholecalciferol (VITAMIN D3) 125 MCG (5000 UT) CAPS Take by mouth.     clobetasol ointment (TEMOVATE) [id number] % Apply 1 application topically 2 (two) times daily.     lidocaine (XYLOCAINE) 2 % solution Take by mouth.     losartan (COZAAR) 100 MG tablet TAKE 1 TABLET BY MOUTH EVERY DAY 90 tablet 1   magnesium oxide (MAG-OX) 400 MG tablet Take 400 mg by mouth daily.     metoCLOPramide (REGLAN) 5 MG tablet Take 1 tablet (5 mg total) by mouth every 8 (eight) hours as needed for nausea or vomiting. 30 tablet 3   mirtazapine (REMERON) 7.5 MG tablet Take 1 tablet (7.5 mg total) by mouth at bedtime. 30 tablet 1   nitroGLYCERIN (NITROSTAT) 0.4 MG SL tablet Place 1 tablet (0.4 mg total) under the tongue every 5 (five) minutes as needed for chest pain. May repeat x 1.  If persistent pain, call 911 (Patient taking differently: Place 0.4 mg under the tongue every 5 (five) minutes as needed for chest pain. May repeat x 1.  If persistent pain, call 911) 25 tablet 0   omeprazole (PRILOSEC) 20 MG capsule Take 20 mg by mouth 2 (two) times daily.     simvastatin (ZOCOR) 40 MG tablet TAKE 1 TABLET BY MOUTH EVERYDAY AT BEDTIME 90 tablet 1   sucralfate (CARAFATE) 1 GM/10ML suspension Take 10 mLs (1 g total) by mouth 4 (four) times daily. 3600 mL 2   triamcinolone cream (KENALOG) 0.1 % Apply 1 application topically 2 (two) times daily. 45 g 0   vitamin B-12 (CYANOCOBALAMIN) 1000 MCG tablet Take 1,000 mcg by mouth daily.     No current facility-administered medications for this visit.    Allergies as of 06/22/2021 - Review Complete 06/22/2021  Allergen Reaction Noted   Amoxicillin-pot clavulanate Other (See Comments) 12/25/2018   Clarithromycin  11/08/2012   Lodine [etodolac] Diarrhea 11/07/2012   Minocycline Other (See Comments) 11/08/2012   Naproxen sodium  06/22/2010   Rofecoxib  06/22/2010   Vibramycin [doxycycline calcium] Other (See Comments) 11/07/2012   Mobic [meloxicam] Rash 11/07/2012     ROS:  General: Negative for anorexia, weight loss, fever, chills, fatigue, weakness. ENT: Negative for hoarseness, difficulty swallowing , nasal congestion. CV: Negative for chest pain, angina, palpitations, dyspnea on exertion, peripheral edema.  Respiratory: Negative for dyspnea at rest, dyspnea on exertion, cough, sputum, wheezing.  GI: See history of present illness. GU:  Negative for dysuria, hematuria, urinary incontinence, urinary frequency, nocturnal urination.  Endo: Negative for unusual weight change.    Physical Examination:   BP 137/74   Pulse 69   Temp 98.1 F (36.7 C) (Oral)   Ht '4\' 11"'$  (1.499 m)   Wt 97 lb 3.2 oz (44.1 kg)   LMP 11/09/1979   BMI 19.63 kg/m   General: Well-nourished, well-developed in no acute distress.  Eyes: No icterus. Conjunctivae pink. Neuro: Alert and oriented x 3.  Grossly intact. Skin: Warm and dry, no jaundice.  Psych: Alert and cooperative, normal mood and affect.   Imaging Studies: No results found.  Assessment and Plan:   Adesuwa Sturm Baroni is a 87 y.o. y/o female  with a prior history of  Extensive evaluations/treatments   by [facility] and [facility]  for lymphocytic colitis, bacterial overgrowth syndrome, has had a negative gastric emptying study.  She has a chronic history of nausea vomiting abdominal discomfort.  Functional disorders has been entertained as well in the past.  She is here to see me as a third gastroenterologist.  It is possible that she may have episodic gastroparesis.  Since her last visit she has had  >17 pounds of unintentional weight loss with worsening of her lower abdominal pain.  Reglan has helped her but obviously she cannot take it long-term.  She is presently on a PPI as well as Carafate and occasional use of Reglan which has kept her symptoms under control.  The main concern at this point of time is loss of appetite and unintentional weight loss.  From my point of view the only thing I can offer is a  colonoscopy which I have offered on multiple occasions and discussed in depth about it today as well.  She has not yet made up her mind about proceeding with the procedure due to the risks versus benefits ratio at her age.  I have left the ball in her court  and informed her to give me a call if she does decide to proceed with the procedure or would like to discuss it any further at any point of time.  Another suggestion would be to discuss with Dr. Nicki Reaper to consider prescribing her medications to treat functional disorders like an SSRI which could also probably help her appetite.  I have advised her to closely monitor her weight and follow-up with Dr. Nicki Reaper.   Dr Jonathon Bellows  MD,MRCP [facility]) Follow up in as needed

## 2021-06-26 ENCOUNTER — Other Ambulatory Visit: Payer: Self-pay | Admitting: Internal Medicine

## 2021-06-26 ENCOUNTER — Telehealth: Payer: Self-pay | Admitting: Internal Medicine

## 2021-06-26 MED ORDER — ACEBUTOLOL HCL 200 MG PO CAPS: ORAL_CAPSULE | ORAL | 2 refills | Status: AC

## 2021-06-26 NOTE — Telephone Encounter (Signed)
PT needs a refill of acebutolol (SECTRAL) 200 MG capsule called into the [facility], [place]

## 2021-06-26 NOTE — Telephone Encounter (Signed)
Acebutolol medication has been refilled.

## 2021-06-26 NOTE — Addendum Note (Signed)
Addended by: Ezequiel Ganser on: 06/26/2021 12:55 PM   Modules accepted: Orders

## 2021-06-28 ENCOUNTER — Encounter: Payer: Self-pay | Admitting: Internal Medicine

## 2021-06-28 DIAGNOSIS — I700 Atherosclerosis of aorta: Secondary | ICD-10-CM | POA: Insufficient documentation

## 2021-06-28 NOTE — Assessment & Plan Note (Signed)
S/p biopsy - pathology - atypia of undetermined significance.  Saw ENT.  Referred to endocrinology - appt 05/26/21.  Do not see notes from visit.  F/u with Ms Tsao regarding need for f/u appt

## 2021-06-28 NOTE — Assessment & Plan Note (Signed)
On prilosec now.  Also takes carafate.  Symptoms have improved some.  Has f/u with GI next week.

## 2021-06-28 NOTE — Assessment & Plan Note (Signed)
Continue losartan and amlodipine.  Follow pressures.  Follow metabolic panel.  

## 2021-06-28 NOTE — Assessment & Plan Note (Signed)
Persistent unintentional weight loss.  Seeing GI.  Has f/u planned next week.  Has had extensive w/up.  On remeron.  May need to adjust the dose.  Appetite has improved some.  Follow.  Schedule f/u soon to reassess.

## 2021-06-28 NOTE — Assessment & Plan Note (Signed)
Continue simvastatin. 

## 2021-06-28 NOTE — Assessment & Plan Note (Signed)
Has lost a lot of weight.  Appetite has improved some.  Follow met b and a1c.

## 2021-06-28 NOTE — Assessment & Plan Note (Signed)
Continue simvastatin.  Follow lipid panel and liver function tests.

## 2021-06-28 NOTE — Assessment & Plan Note (Signed)
Had carotid ultrasound 02/2021 - some build up L>R.  No significant stenosis.  Continue risk factor modification.

## 2021-07-10 ENCOUNTER — Other Ambulatory Visit: Payer: Self-pay | Admitting: Internal Medicine

## 2021-07-20 ENCOUNTER — Other Ambulatory Visit: Payer: Self-pay | Admitting: Internal Medicine

## 2021-07-21 ENCOUNTER — Telehealth: Payer: Self-pay | Admitting: Internal Medicine

## 2021-07-21 MED ORDER — LOSARTAN POTASSIUM 100 MG PO TABS: ORAL_TABLET | ORAL | 1 refills | Status: AC

## 2021-07-21 NOTE — Telephone Encounter (Signed)
-----   Message from [name], [place] sent at 07/15/2021  4:24 PM EDT ----- Hi,   This patient is flagging as "in danger of faily" adherence to losartan. Looks like she had a 6 month supply prescribed in January, so she is due for a refill. Could you go ahead and send this in to promote adherence?  Teresa Wade

## 2021-07-21 NOTE — Telephone Encounter (Signed)
Rx sent in for losartan.

## 2021-08-13 ENCOUNTER — Ambulatory Visit (INDEPENDENT_AMBULATORY_CARE_PROVIDER_SITE_OTHER): Payer: Medicare Other

## 2021-08-13 VITALS — Ht 59.0 in | Wt 90.0 lb

## 2021-08-13 DIAGNOSIS — Z0000 Encounter for general adult medical examination without abnormal findings: Secondary | ICD-10-CM

## 2021-08-13 NOTE — Patient Instructions (Addendum)
  Teresa Wade , Thank you for taking time to come for your Medicare Wellness Visit. I appreciate your ongoing commitment to your health goals. Please review the following plan we discussed and let me know if I can assist you in the future.   These are the goals we discussed:  Goals       Patient Stated     Follow up with Primary Care Provider (pt-stated)      As needed        This is a list of the screening recommended for you and due dates:  Health Maintenance  Topic Date Due   Eye exam for diabetics  01/23/2021   Zoster (Shingles) Vaccine (1 of 2) 11/12/2021*   Flu Shot  02/26/2022*   Mammogram  10/02/2021   Hemoglobin A1C  11/18/2021   Complete foot exam   06/18/2022   Tetanus Vaccine  04/12/2023   DEXA scan (bone density measurement)  Completed   COVID-19 Vaccine  Completed   Pneumonia vaccines  Completed   HPV Vaccine  Aged Out  *Topic was postponed. The date shown is not the original due date.

## 2021-08-13 NOTE — Progress Notes (Signed)
Subjective:   Kaele Oplinger Henson is a 87 y.o. female who presents for Medicare Annual (Subsequent) preventive examination.  Review of Systems    No ROS.  Medicare Wellness Virtual Visit.  Visual/audio telehealth visit, UTA vital signs.   See social history for additional risk factors.   Cardiac Risk Factors include: advanced age (>49mn, >>29women)     Objective:    Today's Vitals   08/13/21 1123  Weight: 90 lb (40.8 kg)  Height: '4\' 11"'$  (1.499 m)   Body mass index is 18.18 kg/m.  Advanced Directives 08/13/2021 05/16/2021 04/16/2021 11/24/2020 06/10/2020 06/08/2019 06/06/2018  Does Patient Have a Medical Advance Directive? Yes No Yes Yes Yes Yes Yes  Type of AParamed[place]of [place] will - Healthcare Power of [place] will Healthcare Power of [place] will  Does patient want to make changes to medical advance directive? No - Patient declined - - - No - Patient declined No - Patient declined No - Patient declined  Copy of [place]? No - copy requested - No - copy requested - No - copy requested No - copy requested No - copy requested    Current Medications (verified) Outpatient Encounter Medications as of 08/13/2021  Medication Sig   acebutolol (SECTRAL) 200 MG capsule TAKE 2 CAPSULES BY MOUTH EVERY MORNING & 1 CAPSULE EVERY EVENING   amLODipine (NORVASC) 5 MG tablet TAKE ONE TABLET (5 MG) BY MOUTH EVERY DAY   aspirin 81 MG tablet Take 81 mg by mouth daily.   calcium carbonate (OS-CAL) 1250 MG chewable tablet Chew 1 tablet by mouth 2 (two) times daily.   calcium elemental as carbonate (BARIATRIC TUMS ULTRA) 400 MG chewable tablet Chew by mouth.   Cholecalciferol (VITAMIN D3) 125 MCG (5000 UT) CAPS Take by mouth.   clobetasol ointment (TEMOVATE) 0AB-123456789% Apply 1 application topically 2 (two) times daily.   lidocaine (XYLOCAINE) 2 %  solution Take by mouth.   losartan (COZAAR) 100 MG tablet TAKE ONE TABLET (100 MG) BY MOUTH EVERY DAY   magnesium oxide (MAG-OX) 400 MG tablet Take 400 mg by mouth daily.   metoCLOPramide (REGLAN) 5 MG tablet Take 1 tablet (5 mg total) by mouth every 8 (eight) hours as needed for nausea or vomiting.   mirtazapine (REMERON) 7.5 MG tablet Take 1 tablet (7.5 mg total) by mouth at bedtime.   nitroGLYCERIN (NITROSTAT) 0.4 MG SL tablet Place 1 tablet (0.4 mg total) under the tongue every 5 (five) minutes as needed for chest pain. May repeat x 1.  If persistent pain, call 911 (Patient taking differently: Place 0.4 mg under the tongue every 5 (five) minutes as needed for chest pain. May repeat x 1.  If persistent pain, call 911)   omeprazole (PRILOSEC) 20 MG capsule Take 20 mg by mouth 2 (two) times daily.   simvastatin (ZOCOR) 40 MG tablet TAKE 1 TABLET BY MOUTH EVERYDAY AT BEDTIME   sucralfate (CARAFATE) 1 GM/10ML suspension Take 10 mLs (1 g total) by mouth 4 (four) times daily.   triamcinolone cream (KENALOG) 0.1 % Apply 1 application topically 2 (two) times daily.   vitamin B-12 (CYANOCOBALAMIN) 1000 MCG tablet Take 1,000 mcg by mouth daily.   No facility-administered encounter medications on file as of 08/13/2021.    Allergies (verified) Amoxicillin-pot clavulanate, Clarithromycin, Lodine [etodolac], Minocycline, Naproxen sodium, Rofecoxib, Vibramycin [doxycycline calcium], and Mobic [meloxicam]   History: Past Medical  History:  Diagnosis Date   Abnormal chest CT    right hilar cyst   Anxiety    Fibrocystic disease of breast    GERD (gastroesophageal reflux disease)    History of thrombocytopenia    Hypercholesterolemia    Hyperglycemia    Hypertension    Osteoarthritis    Osteoporosis    s/p Actonel, Reclast (last 2011)   Palpitations    Pre-diabetes    Psoriasis    Reflux esophagitis    Past Surgical History:  Procedure Laterality Date   ABDOMINAL HYSTERECTOMY  1980   BREAST  EXCISIONAL BIOPSY Left 1976   benign   COLONOSCOPY WITH PROPOFOL N/A 05/05/2018   Procedure: COLONOSCOPY WITH PROPOFOL;  Surgeon: Manya Silvas, MD;  Location: [facility] ENDOSCOPY;  Service: Endoscopy;  Laterality: N/A;   ESOPHAGOGASTRODUODENOSCOPY (EGD) WITH PROPOFOL N/A 05/05/2018   Procedure: ESOPHAGOGASTRODUODENOSCOPY (EGD) WITH PROPOFOL;  Surgeon: Manya Silvas, MD;  Location: [facility] ENDOSCOPY;  Service: Endoscopy;  Laterality: N/A;   ESOPHAGOGASTRODUODENOSCOPY (EGD) WITH PROPOFOL N/A 04/16/2021   Procedure: ESOPHAGOGASTRODUODENOSCOPY (EGD) WITH PROPOFOL;  Surgeon: Jonathon Bellows, MD;  Location: [facility] ENDOSCOPY;  Service: Gastroenterology;  Laterality: N/A;   THUMB AMPUTATION Left 2015   Family History  Problem Relation Age of Onset   Heart disease Mother    Hypertension Mother    Colon cancer Sister    Sleep apnea Brother    Mental illness Other        sibling, suicide   Breast cancer Neg Hx    Thyroid disease Neg Hx    Social History   Socioeconomic History   Marital status: Married    Spouse name: Not on file   Number of children: 1   Years of education: Not on file   Highest education level: Not on file  Occupational History   Not on file  Tobacco Use   Smoking status: Former    Types: Cigarettes    Quit date: 1999    Years since quitting: 23.7   Smokeless tobacco: Never  Vaping Use   Vaping Use: Never used  Substance and Sexual Activity   Alcohol use: No    Alcohol/week: 0.0 standard drinks   Drug use: No   Sexual activity: Never  Other Topics Concern   Not on file  Social History Narrative   Not on file   Social Determinants of Health   Financial Resource Strain: Low Risk    Difficulty of Paying Living Expenses: Not hard at all  Food Insecurity: No Food Insecurity   Worried About Charity fundraiser in the Last Year: Never true   [place] in the Last Year: Never true  Transportation Needs: No Transportation Needs   Lack of Transportation (Medical):  No   Lack of Transportation (Non-Medical): No  Physical Activity: Insufficiently Active   Days of Exercise per Week: 2 days   Minutes of Exercise per Session: 30 min  Stress: No Stress Concern Present   Feeling of Stress : Not at all  Social Connections: Unknown   Frequency of Communication with Friends and Family: Not on file   Frequency of Social Gatherings with Friends and Family: Not on file   Attends Religious Services: Not on file   Active Member of Clubs or Organizations: Not on file   Attends Archivist Meetings: Not on file   Marital Status: Married    Tobacco Counseling Counseling given: Not Answered   Clinical Intake:  Pre-visit preparation completed: Yes  Diabetes: Yes (Followed by pcp)  How often do you need to have someone help you when you read instructions, pamphlets, or other written materials from your doctor or pharmacy?: 1 - Never  Nutrition Risk Assessment: Does the patient have any non-healing wounds?  No   Financial Strains and Diabetes Management: Are you having any financial strains with the device, your supplies or your medication? No .  Does the patient want to be seen by Chronic Care Management for management of their diabetes?  No  Would the patient like to be referred to a Nutritionist or for Diabetic Management?  No   Eye exam- plans to schedule annual exam. No retinopathy reported.    Activities of Daily Living In your present state of health, do you have any difficulty performing the following activities: 08/13/2021  Hearing? Y  Comment Hearing aids  Vision? N  Difficulty concentrating or making decisions? N  Walking or climbing stairs? N  Dressing or bathing? N  Doing errands, shopping? N  Preparing Food and eating ? N  Using the Toilet? N  In the past six months, have you accidently leaked urine? N  Do you have problems with loss of bowel control? N  Managing your Medications? N  Managing your Finances? N   Housekeeping or managing your Housekeeping? N  Some recent data might be hidden    Patient Care Team: Einar Pheasant, MD as PCP - General (Internal Medicine)  Indicate any recent Medical Services you may have received from other than [facility] providers in the past year (date may be approximate).     Assessment:   This is a routine wellness examination for Yailine.  I connected with Lari today by telephone and verified that I am speaking with the correct person using two identifiers. Location patient: home Location provider: work Persons participating in the virtual visit: patient, Marine scientist.    I discussed the limitations, risks, security and privacy concerns of performing an evaluation and management service by telephone and the availability of in person appointments. The patient expressed understanding and verbally consented to this telephonic visit.    Interactive audio and video telecommunications were attempted between this provider and patient, however failed, due to patient having technical difficulties OR patient did not have access to video capability.  We continued and completed visit with audio only.  Some vital signs may be absent or patient reported.   Hearing/Vision screen Hearing Screening - Comments:: Hearing aid, bilateral  Vision Screening - Comments:: Followed by [facility], Dr. George Ina  Wears corrective lenses  Cataract extraction, bilateral  They have seen their ophthalmologist in the last 24 months.   Dietary issues and exercise activities discussed: Current Exercise Habits: Home exercise routine, Intensity: Mild   Goals Addressed               This Visit's Progress     Patient Stated     Follow up with Primary Care Provider (pt-stated)        As needed       Depression Screen PHQ 2/9 Scores 08/13/2021 06/18/2021 11/24/2020 06/10/2020 06/08/2019 06/06/2018 04/18/2017  PHQ - 2 Score 0 2 0 0 0 0 0  PHQ- 9 Score 0 - - - - - 0    Fall Risk Fall  Risk  08/13/2021 06/18/2021 05/21/2021 12/22/2020 11/24/2020  Falls in the past year? 0 0 0 0 0  Number falls in past yr: 0 0 0 0 -  Injury with Fall? 0 0 0  0 -  Follow up Falls evaluation completed Falls evaluation completed Falls evaluation completed Falls evaluation completed -    FALL RISK PREVENTION PERTAINING TO THE HOME: Adequate lighting in your home to reduce risk of falls? Yes   ASSISTIVE DEVICES UTILIZED TO PREVENT FALLS: Life alert? No  Use of a cane, walker or w/c? No   TIMED UP AND GO: Was the test performed? No .   Cognitive Function:  Patient is alert and oriented x3.    6CIT Screen 06/10/2020 06/08/2019 06/06/2018 04/18/2017  What Year? 0 points 0 points 0 points 0 points  What month? 0 points 0 points 0 points 0 points  What time? - 0 points 0 points 0 points  Count back from 20 - 0 points 0 points 0 points  Months in reverse 0 points 0 points 0 points 0 points  Repeat phrase 0 points 0 points 0 points 0 points  Total Score - 0 0 0    Immunizations Immunization History  Administered Date(s) Administered   Fluad Quad(high Dose 65+) 08/08/2019, 08/08/2020   Influenza Split 09/10/2013   Influenza, High Dose Seasonal PF 09/04/2018   Influenza,inj,Quad PF,6+ Mos 07/25/2014   Influenza-Unspecified 08/16/2012, 08/22/2013, 07/25/2014, 08/30/2015, 08/25/2016, 08/30/2017   PFIZER Comirnaty(Gray Top)Covid-19 Tri-Sucrose Vaccine 05/08/2021   PFIZER(Purple Top)SARS-COV-2 Vaccination 12/04/2019, 12/25/2019, 09/25/2020   Pneumococcal Conjugate-13 03/19/2014   Pneumococcal Polysaccharide-23 05/31/2017   Td 04/11/2013   Shingrix vaccine- Due, Education has been provided regarding the importance of this vaccine. Advised may receive this vaccine at local pharmacy or Health Dept. Aware to provide a copy of the vaccination record if obtained from local pharmacy or Health Dept. Verbalized acceptance and understanding. Deferred.   Health Maintenance Health Maintenance  Topic Date  Due   OPHTHALMOLOGY EXAM  01/23/2021   Zoster Vaccines- Shingrix (1 of 2) 11/12/2021 (Originally 01/03/1982)   INFLUENZA VACCINE  02/26/2022 (Originally 06/29/2021)   MAMMOGRAM  10/02/2021   HEMOGLOBIN A1C  11/18/2021   FOOT EXAM  06/18/2022   TETANUS/TDAP  04/12/2023   DEXA SCAN  Completed   COVID-19 Vaccine  Completed   PNA vac Low Risk Adult  Completed   HPV VACCINES  Aged Out   Vision Screening: Recommended annual ophthalmology exams for early detection of glaucoma and other disorders of the eye.  Dental Screening: Recommended annual dental exams for proper oral hygiene  Community Resource Referral / Chronic Care Management: CRR required this visit?  No   CCM required this visit?  No      Plan:   Keep all routine maintenance appointments.   I have personally reviewed and noted the following in the patient's chart:   Medical and social history Use of alcohol, tobacco or illicit drugs  Current medications and supplements including opioid prescriptions. Not taking opioid.  Functional ability and status Nutritional status Physical activity Advanced directives List of other physicians Hospitalizations, surgeries, and ER visits in previous 12 months Vitals Screenings to include cognitive, depression, and falls Referrals and appointments  In addition, I have reviewed and discussed with patient certain preventive protocols, quality metrics, and best practice recommendations. A written personalized care plan for preventive services as well as general preventive health recommendations were provided to patient via mychart.     Varney Biles, LPN   [id number]

## 2021-09-08 ENCOUNTER — Ambulatory Visit: Payer: Medicare Other | Admitting: Internal Medicine

## 2021-09-08 ENCOUNTER — Other Ambulatory Visit: Payer: Self-pay

## 2021-09-08 VITALS — BP 126/76 | HR 65 | Temp 97.6°F | Resp 16 | Ht 59.0 in | Wt 87.8 lb

## 2021-09-08 DIAGNOSIS — I10 Essential (primary) hypertension: Secondary | ICD-10-CM

## 2021-09-08 DIAGNOSIS — E1165 Type 2 diabetes mellitus with hyperglycemia: Secondary | ICD-10-CM | POA: Diagnosis not present

## 2021-09-08 DIAGNOSIS — I779 Disorder of arteries and arterioles, unspecified: Secondary | ICD-10-CM | POA: Diagnosis not present

## 2021-09-08 DIAGNOSIS — R634 Abnormal weight loss: Secondary | ICD-10-CM

## 2021-09-08 DIAGNOSIS — E041 Nontoxic single thyroid nodule: Secondary | ICD-10-CM

## 2021-09-08 DIAGNOSIS — Z23 Encounter for immunization: Secondary | ICD-10-CM | POA: Diagnosis not present

## 2021-09-08 DIAGNOSIS — I700 Atherosclerosis of aorta: Secondary | ICD-10-CM

## 2021-09-08 DIAGNOSIS — F32A Depression, unspecified: Secondary | ICD-10-CM

## 2021-09-08 DIAGNOSIS — R109 Unspecified abdominal pain: Secondary | ICD-10-CM

## 2021-09-08 DIAGNOSIS — E7800 Pure hypercholesterolemia, unspecified: Secondary | ICD-10-CM

## 2021-09-08 MED ORDER — SERTRALINE HCL 25 MG PO TABS: 25.0000 mg | ORAL_TABLET | Freq: Every day | ORAL | 2 refills | Status: DC

## 2021-09-08 NOTE — Progress Notes (Signed)
Patient ID: Teresa Wade, female   DOB: 12-11-1931, 87 y.o.   MRN: [id number]   Subjective:    Patient ID: Teresa Wade, female    DOB: 1932/08/07, 87 y.o.   MRN: [id number]  This visit occurred during the SARS-CoV-2 public health emergency.  Safety protocols were in place, including screening questions prior to the visit, additional usage of staff PPE, and extensive cleaning of exam room while observing appropriate contact time as indicated for disinfecting solutions.   Patient here for follow up appt .   HPI Here for f/u - persistent GI issues.  She has had extensive GI w/up and treatment for lymphocytic colitis, bacterial overgrowth syndrome.  Has had a negative gastric emptying study.  She has tried various PPIs, carafate and reglan.  Continues to have intermittent episodes of nausea, decreased appetite and abdominal pain.  Seeing Dr Vicente Males.  He discussed colonoscopy.  She has been hesitant about having.  Discussed with her today.  Increased stress related to above.  Discussed trial of SSRI.  Previously tried remeron.  Off now.  Persistent weight loss.  No chest pain.  Breathing stable.     Past Medical History:  Diagnosis Date   Abnormal chest CT    right hilar cyst   Anxiety    Fibrocystic disease of breast    GERD (gastroesophageal reflux disease)    History of thrombocytopenia    Hypercholesterolemia    Hyperglycemia    Hypertension    Osteoarthritis    Osteoporosis    s/p Actonel, Reclast (last 2011)   Palpitations    Pre-diabetes    Psoriasis    Reflux esophagitis    Past Surgical History:  Procedure Laterality Date   ABDOMINAL HYSTERECTOMY  1980   BREAST EXCISIONAL BIOPSY Left 1976   benign   COLONOSCOPY WITH PROPOFOL N/A 05/05/2018   Procedure: COLONOSCOPY WITH PROPOFOL;  Surgeon: Manya Silvas, MD;  Location: [facility] ENDOSCOPY;  Service: Endoscopy;  Laterality: N/A;   ESOPHAGOGASTRODUODENOSCOPY (EGD) WITH PROPOFOL N/A 05/05/2018   Procedure:  ESOPHAGOGASTRODUODENOSCOPY (EGD) WITH PROPOFOL;  Surgeon: Manya Silvas, MD;  Location: [facility] ENDOSCOPY;  Service: Endoscopy;  Laterality: N/A;   ESOPHAGOGASTRODUODENOSCOPY (EGD) WITH PROPOFOL N/A 04/16/2021   Procedure: ESOPHAGOGASTRODUODENOSCOPY (EGD) WITH PROPOFOL;  Surgeon: Jonathon Bellows, MD;  Location: [facility] ENDOSCOPY;  Service: Gastroenterology;  Laterality: N/A;   THUMB AMPUTATION Left 2015   Family History  Problem Relation Age of Onset   Heart disease Mother    Hypertension Mother    Colon cancer Sister    Sleep apnea Brother    Mental illness Other        sibling, suicide   Breast cancer Neg Hx    Thyroid disease Neg Hx    Social History   Socioeconomic History   Marital status: Married    Spouse name: Not on file   Number of children: 1   Years of education: Not on file   Highest education level: Not on file  Occupational History   Not on file  Tobacco Use   Smoking status: Former    Types: Cigarettes    Quit date: 1999    Years since quitting: 23.8   Smokeless tobacco: Never  Vaping Use   Vaping Use: Never used  Substance and Sexual Activity   Alcohol use: No    Alcohol/week: 0.0 standard drinks   Drug use: No   Sexual activity: Never  Other Topics Concern   Not on file  Social History Narrative  Not on file   Social Determinants of Health   Financial Resource Strain: Low Risk    Difficulty of Paying Living Expenses: Not hard at all  Food Insecurity: No Food Insecurity   Worried About Charity fundraiser in the Last Year: Never true   Ginger Blue in the Last Year: Never true  Transportation Needs: No Transportation Needs   Lack of Transportation (Medical): No   Lack of Transportation (Non-Medical): No  Physical Activity: Insufficiently Active   Days of Exercise per Week: 2 days   Minutes of Exercise per Session: 30 min  Stress: No Stress Concern Present   Feeling of Stress : Not at all  Social Connections: Unknown   Frequency of  Communication with Friends and Family: Not on file   Frequency of Social Gatherings with Friends and Family: Not on file   Attends Religious Services: Not on file   Active Member of Clubs or Organizations: Not on file   Attends Archivist Meetings: Not on file   Marital Status: Married     Review of Systems  Constitutional:  Positive for appetite change and fatigue.       Continued weight loss.   HENT:  Negative for congestion and sinus pressure.   Respiratory:  Negative for cough, chest tightness and shortness of breath.   Cardiovascular:  Negative for chest pain, palpitations and leg swelling.  Gastrointestinal:        Persistent intermittent episodes of nausea, vomiting and abdominal pain.    Genitourinary:  Negative for difficulty urinating and dysuria.  Musculoskeletal:  Negative for joint swelling and myalgias.  Skin:  Negative for color change and rash.  Neurological:  Negative for dizziness, light-headedness and headaches.  Psychiatric/Behavioral:         Increased stress and depression.        Objective:     BP 126/76   Pulse 65   Temp 97.6 F (36.4 C)   Resp 16   Ht _0  (1.499 m)   Wt 87 lb 12.8 oz (39.8 kg)   LMP 11/09/1979   SpO2 99%   BMI 17.73 kg/m  Wt Readings from Last 3 Encounters:  09/10/21 85 lb (38.6 kg)  09/08/21 87 lb 12.8 oz (39.8 kg)  08/13/21 90 lb (40.8 kg)    Physical Exam Vitals reviewed.  Constitutional:      General: She is not in acute distress.    Appearance: Normal appearance.  HENT:     Head: Normocephalic and atraumatic.     Right Ear: External ear normal.     Left Ear: External ear normal.  Eyes:     General: No scleral icterus.       Right eye: No discharge.        Left eye: No discharge.     Conjunctiva/sclera: Conjunctivae normal.  Neck:     Thyroid: No thyromegaly.  Cardiovascular:     Rate and Rhythm: Normal rate and regular rhythm.  Pulmonary:     Effort: No respiratory distress.     Breath sounds:  Normal breath sounds. No wheezing.  Abdominal:     General: Bowel sounds are normal.     Palpations: Abdomen is soft.     Tenderness: There is no abdominal tenderness.  Musculoskeletal:        General: No swelling or tenderness.     Cervical back: Neck supple. No tenderness.  Lymphadenopathy:     Cervical: No cervical adenopathy.  Skin:  Findings: No erythema or rash.  Neurological:     Mental Status: She is alert.  Psychiatric:        Mood and Affect: Mood normal.        Behavior: Behavior normal.     Outpatient Encounter Medications as of 09/08/2021  Medication Sig   sertraline (ZOLOFT) 25 MG tablet Take 1 tablet (25 mg total) by mouth daily.   acebutolol (SECTRAL) 200 MG capsule TAKE 2 CAPSULES BY MOUTH EVERY MORNING & 1 CAPSULE EVERY EVENING   amLODipine (NORVASC) 5 MG tablet TAKE ONE TABLET (5 MG) BY MOUTH EVERY DAY   aspirin 81 MG tablet Take 81 mg by mouth daily.   calcium elemental as carbonate (BARIATRIC TUMS ULTRA) 400 MG chewable tablet Chew by mouth.   clobetasol ointment (TEMOVATE) 1.61 % Apply 1 application topically 2 (two) times daily.   losartan (COZAAR) 100 MG tablet TAKE ONE TABLET (100 MG) BY MOUTH EVERY DAY   metoCLOPramide (REGLAN) 5 MG tablet Take 1 tablet (5 mg total) by mouth every 8 (eight) hours as needed for nausea or vomiting.   nitroGLYCERIN (NITROSTAT) 0.4 MG SL tablet Place 1 tablet (0.4 mg total) under the tongue every 5 (five) minutes as needed for chest pain. May repeat x 1.  If persistent pain, call 911 (Patient taking differently: Place 0.4 mg under the tongue every 5 (five) minutes as needed for chest pain. May repeat x 1.  If persistent pain, call 911)   omeprazole (PRILOSEC) 20 MG capsule Take 20 mg by mouth 2 (two) times daily.   triamcinolone cream (KENALOG) 0.1 % Apply 1 application topically 2 (two) times daily.   [DISCONTINUED] calcium carbonate (OS-CAL) 1250 MG chewable tablet Chew 1 tablet by mouth 2 (two) times daily. (Patient not  taking: Reported on 09/08/2021)   [DISCONTINUED] Cholecalciferol (VITAMIN D3) 125 MCG (5000 UT) CAPS Take by mouth. (Patient not taking: Reported on 09/08/2021)   [DISCONTINUED] lidocaine (XYLOCAINE) 2 % solution Take by mouth. (Patient not taking: Reported on 09/08/2021)   [DISCONTINUED] magnesium oxide (MAG-OX) 400 MG tablet Take 400 mg by mouth daily. (Patient not taking: Reported on 09/08/2021)   [DISCONTINUED] mirtazapine (REMERON) 7.5 MG tablet Take 1 tablet (7.5 mg total) by mouth at bedtime. (Patient not taking: Reported on 09/08/2021)   [DISCONTINUED] simvastatin (ZOCOR) 40 MG tablet TAKE 1 TABLET BY MOUTH EVERYDAY AT BEDTIME (Patient not taking: Reported on 09/08/2021)   [DISCONTINUED] sucralfate (CARAFATE) 1 GM/10ML suspension Take 10 mLs (1 g total) by mouth 4 (four) times daily. (Patient not taking: Reported on 09/08/2021)   [DISCONTINUED] vitamin B-12 (CYANOCOBALAMIN) 1000 MCG tablet Take 1,000 mcg by mouth daily. (Patient not taking: Reported on 09/08/2021)   No facility-administered encounter medications on file as of 09/08/2021.     Lab Results  Component Value Date   WBC 7.3 09/10/2021   HGB 12.7 09/10/2021   HCT 36.3 09/10/2021   PLT 305 09/10/2021   GLUCOSE 128 (H) 09/10/2021   CHOL 157 05/19/2021   TRIG 114.0 05/19/2021   HDL 48.30 05/19/2021   LDLDIRECT 75.0 01/06/2018   LDLCALC 86 05/19/2021   ALT 10 05/19/2021   AST 16 05/19/2021   NA 134 (L) 09/10/2021   K 4.0 09/10/2021   CL 99 09/10/2021   CREATININE 0.80 09/10/2021   BUN 17 09/10/2021   CO2 24 09/10/2021   TSH 1.16 05/19/2021   HGBA1C 6.3 05/19/2021   MICROALBUR <0.7 08/27/2020    CT Abdomen Pelvis W Contrast  Result Date: 05/16/2021  CLINICAL DATA:  Nausea and hematemesis beginning yesterday. EXAM: CT ABDOMEN AND PELVIS WITH CONTRAST TECHNIQUE: Multidetector CT imaging of the abdomen and pelvis was performed using the standard protocol following bolus administration of intravenous contrast. CONTRAST:   134m OMNIPAQUE IOHEXOL 300 MG/ML  SOLN COMPARISON:  Noncontrast CT on 04/15/2021 FINDINGS: Lower Chest: No acute findings. Hepatobiliary: No hepatic masses identified. Gallbladder is unremarkable. No evidence of biliary ductal dilatation. Pancreas:  No mass or inflammatory changes. Spleen: Within normal limits in size and appearance. Adrenals/Urinary Tract: No masses identified. No evidence of ureteral calculi or hydronephrosis. Unremarkable unopacified urinary bladder. Stomach/Bowel: No evidence of obstruction, inflammatory process or abnormal fluid collections. Diverticulosis is seen mainly involving the sigmoid colon, however there is no evidence of diverticulitis. Vascular/Lymphatic: No pathologically enlarged lymph nodes. No acute vascular findings. Aortic atherosclerotic calcification noted. Reproductive: Prior hysterectomy noted. Adnexal regions are unremarkable in appearance. Other:  None. Musculoskeletal:  No suspicious bone lesions identified. IMPRESSION: Colonic diverticulosis, without radiographic evidence of diverticulitis or other acute findings. Aortic Atherosclerosis (ICD10-I70.0). Electronically Signed   By: JMarlaine HindM.D.   On: 05/16/2021 07:58       Assessment & Plan:   Problem List Items Addressed This Visit     Abdominal pain    Continued intermittent episodes of abdominal pain, nausea and vomiting.  Has had extensive GI w/up as outlined.  Previous EGD - gastritis.  Celiac testing negative.  S/p treatment for SIBO.  Off budesonide.  Has adjusted dietary intake. Continues to have pain and continues with weight loss.  Recent CT abdomen and pelvis as outlined.  Seeing Dr AVicente Males  Discussed colonoscopy.  She is still hesitant to proceed.  Questions answered.  Start SSRI.  Follow.       Aortic atherosclerosis (HCC)    Off simvastatin.  Has stopped.  Will hold on restarting.  See if can sort through some of these GI issues.  Will d/w her more in the future.        [place]    Had carotid ultrasound 02/2021 - some build up L>R.  No significant stenosis.  Continue risk factor modification.       Essential hypertension    Continue losartan and amlodipine.  Follow pressures.  Follow metabolic panel.       Hypercholesterolemia    Off simvastatin. She stopped.  Hold on restarting as outlined.        Mild depression    Increased stress and depression related to her current medical issues as outlined.  Discussed with her today.  Start zoloft as directed.  Follow.       Relevant Medications   sertraline (ZOLOFT) 25 MG tablet   Thyroid nodule    S/p biopsy - pathology - atypia of undetermined significance.  Saw ENT and endocrinology.        Type 2 diabetes mellitus with hyperglycemia (HCC)    Has lost a lot of weight.  Follow met b and a1c.        Weight loss    Continues.  Extensive w/up as outlined.  Discussed recent visit with Dr AVicente Males  Discussed previous w/up.  Continue to encourage increased po intake.  Treat stress and depression with zoloft.  Discussed colonoscopy.  She will think about this and notify Dr AVicente Malesif agreeable to proceed.        Other Visit Diagnoses     Need for immunization against influenza    -  Primary  Relevant Orders   Flu Vaccine QUAD High Dose(Fluad) (Completed)        Einar Pheasant, MD

## 2021-09-10 ENCOUNTER — Other Ambulatory Visit: Payer: Self-pay

## 2021-09-10 ENCOUNTER — Emergency Department: Payer: Medicare Other

## 2021-09-10 DIAGNOSIS — Z87891 Personal history of nicotine dependence: Secondary | ICD-10-CM | POA: Diagnosis not present

## 2021-09-10 DIAGNOSIS — Z79899 Other long term (current) drug therapy: Secondary | ICD-10-CM | POA: Diagnosis not present

## 2021-09-10 DIAGNOSIS — E119 Type 2 diabetes mellitus without complications: Secondary | ICD-10-CM | POA: Diagnosis not present

## 2021-09-10 DIAGNOSIS — R0789 Other chest pain: Secondary | ICD-10-CM | POA: Diagnosis not present

## 2021-09-10 DIAGNOSIS — R42 Dizziness and giddiness: Secondary | ICD-10-CM | POA: Diagnosis not present

## 2021-09-10 DIAGNOSIS — I10 Essential (primary) hypertension: Secondary | ICD-10-CM | POA: Insufficient documentation

## 2021-09-10 DIAGNOSIS — Z7982 Long term (current) use of aspirin: Secondary | ICD-10-CM | POA: Insufficient documentation

## 2021-09-10 LAB — BASIC METABOLIC PANEL
Anion gap: 11 (ref 5–15)
BUN: 17 mg/dL (ref 8–23)
CO2: 24 mmol/L (ref 22–32)
Calcium: 9 mg/dL (ref 8.9–10.3)
Chloride: 99 mmol/L (ref 98–111)
Creatinine, Ser: 0.8 mg/dL (ref 0.44–1.00)
GFR, Estimated: >60 mL/min (ref >60)
Glucose, Bld: 128 mg/dL — ABNORMAL HIGH (ref 70–99)
Potassium: 4 mmol/L (ref 3.5–5.1)
Sodium: 134 mmol/L — ABNORMAL LOW (ref 135–145)

## 2021-09-10 LAB — CBC
HCT: 36.3 % (ref 36.0–46.0)
Hemoglobin: 12.7 g/dL (ref 12.0–15.0)
MCH: 29.7 pg (ref 26.0–34.0)
MCHC: 35 g/dL (ref 30.0–36.0)
MCV: 85 fL (ref 80.0–100.0)
Platelets: 305 10*3/uL (ref 150–400)
RBC: 4.27 MIL/uL (ref 3.87–5.11)
RDW: 13.9 % (ref 11.5–15.5)
WBC: 7.3 10*3/uL (ref 4.0–10.5)
nRBC: 0 % (ref 0.0–0.2)

## 2021-09-10 LAB — TROPONIN I (HIGH SENSITIVITY): Troponin I (High Sensitivity): 10 ng/L (ref <18)

## 2021-09-10 NOTE — ED Triage Notes (Signed)
Pt to ED via EMS from home, pt states that she began to have centralized chest pain earlier tonight. Pt took 2 nitro and 324 asa at home with minimal relief. Ems gave 1 spray of nitro which helped with pain but pt states pain is back. Pt states she has some dizziness associated with the pain.

## 2021-09-11 ENCOUNTER — Emergency Department
Admission: EM | Admit: 2021-09-11 | Discharge: 2021-09-11 | Disposition: A | Discharge status: Home / Self Care | Payer: Medicare Other | Attending: Emergency Medicine | Admitting: Emergency Medicine

## 2021-09-11 DIAGNOSIS — R079 Chest pain, unspecified: Secondary | ICD-10-CM

## 2021-09-11 LAB — TROPONIN I (HIGH SENSITIVITY): Troponin I (High Sensitivity): 9 ng/L (ref <18)

## 2021-09-11 NOTE — ED Provider Notes (Signed)
[facility] Emergency Department Provider Note  ____________________________________________   Event Date/Time   First MD Initiated Contact with Patient 09/11/21 0020     (approximate)  I have reviewed the triage vital signs and the nursing notes.   HISTORY  Chief Complaint Chest Pain    HPI Teresa Wade is a 87 y.o. female with history of hypertension, hyperlipidemia, diabetes who presents to the emergency department with central chest pain without radiation that felt like a pressure that started tonight at 7 PM after eating soup.  States she took 2 nitroglycerin at home without any improvement in pain.  States she called EMS.  They gave her nitroglycerin spray and she states that only helped her pain a little bit.  Pain has now resolved spontaneously.  She denies any known aggravating or alleviating factors.  Denies shortness of breath, nausea, vomiting, diaphoresis but states she did feel lightheaded prior to taking the nitroglycerin.  No fever or cough.  No history of CAD.  Last stress test appears to have been in 2017 with no sign of reversible ischemia per cardiology notes.  No history of cardiac catheterization.  No history of PE or DVT.  No lower extremity swelling or pain.     Cardiologist Fath    Past Medical History:  Diagnosis Date   Abnormal chest CT    right hilar cyst   Anxiety    Fibrocystic disease of breast    GERD (gastroesophageal reflux disease)    History of thrombocytopenia    Hypercholesterolemia    Hyperglycemia    Hypertension    Osteoarthritis    Osteoporosis    s/p Actonel, Reclast (last 2011)   Palpitations    Pre-diabetes    Psoriasis    Reflux esophagitis     Patient Active Problem List   Diagnosis Date Noted   Aortic atherosclerosis ([place]) 06/28/2021   Weight loss 03/22/2021   Carotid artery disease ([place]) 03/22/2021   Knee pain, left 01/25/2021   Thyroid nodule 01/25/2021   Sciatica, left side  12/23/2020   Dextroscoliosis of lumbosacral spine 12/23/2020   Fatigue 08/08/2020   Rash 07/01/2019   Lymphocytic colitis 06/28/2018   Abdominal pain 01/12/2018   Chronic venous insufficiency 02/28/2017   Varicose veins of leg with pain, left 12/31/2016   Nausea 12/08/2016   Back pain 12/08/2016   Hoarseness 07/16/2015   Dysphonia 07/16/2015   Health care maintenance 06/29/2015   Heart palpitations 08/07/2014   Hyperglycemia 03/23/2014   History of colonic polyps 12/02/2013   GERD (gastroesophageal reflux disease) 11/08/2012   Osteoporosis 11/08/2012   Diabetes ([place]) 11/08/2012   Age-related osteoporosis without current pathological fracture 11/08/2012   Type 2 diabetes mellitus with hyperglycemia ([place]) 11/08/2012   Hypercholesterolemia 06/22/2010   Essential hypertension 06/22/2010    Past Surgical History:  Procedure Laterality Date   ABDOMINAL HYSTERECTOMY  1980   BREAST EXCISIONAL BIOPSY Left 1976   benign   COLONOSCOPY WITH PROPOFOL N/A 05/05/2018   Procedure: COLONOSCOPY WITH PROPOFOL;  Surgeon: Manya Silvas, MD;  Location: [facility] ENDOSCOPY;  Service: Endoscopy;  Laterality: N/A;   ESOPHAGOGASTRODUODENOSCOPY (EGD) WITH PROPOFOL N/A 05/05/2018   Procedure: ESOPHAGOGASTRODUODENOSCOPY (EGD) WITH PROPOFOL;  Surgeon: Manya Silvas, MD;  Location: [facility] ENDOSCOPY;  Service: Endoscopy;  Laterality: N/A;   ESOPHAGOGASTRODUODENOSCOPY (EGD) WITH PROPOFOL N/A 04/16/2021   Procedure: ESOPHAGOGASTRODUODENOSCOPY (EGD) WITH PROPOFOL;  Surgeon: Jonathon Bellows, MD;  Location: [facility] ENDOSCOPY;  Service: Gastroenterology;  Laterality: N/A;   THUMB AMPUTATION Left 2015  Prior to Admission medications   Medication Sig Start Date End Date Taking? Authorizing Provider  acebutolol (SECTRAL) 200 MG capsule TAKE 2 CAPSULES BY MOUTH EVERY MORNING & 1 CAPSULE EVERY EVENING 06/26/21   Einar Pheasant, MD  amLODipine (NORVASC) 5 MG tablet TAKE ONE TABLET (5 MG) BY MOUTH EVERY DAY 06/26/21   Einar Pheasant, MD  aspirin 81 MG tablet Take 81 mg by mouth daily.    [provider]  calcium elemental as carbonate (BARIATRIC TUMS ULTRA) 400 MG chewable tablet Chew by mouth.    [provider]  clobetasol ointment (TEMOVATE) 8.31 % Apply 1 application topically 2 (two) times daily.    [provider]  losartan (COZAAR) 100 MG tablet TAKE ONE TABLET (100 MG) BY MOUTH EVERY DAY 07/21/21   Einar Pheasant, MD  metoCLOPramide (REGLAN) 5 MG tablet Take 1 tablet (5 mg total) by mouth every 8 (eight) hours as needed for nausea or vomiting. 05/14/21 11/14/21  Jonathon Bellows, MD  nitroGLYCERIN (NITROSTAT) 0.4 MG SL tablet Place 1 tablet (0.4 mg total) under the tongue every 5 (five) minutes as needed for chest pain. May repeat x 1.  If persistent pain, call 911 Patient taking differently: Place 0.4 mg under the tongue every 5 (five) minutes as needed for chest pain. May repeat x 1.  If persistent pain, call 911 03/11/15   Einar Pheasant, MD  omeprazole (PRILOSEC) 20 MG capsule Take 20 mg by mouth 2 (two) times daily. 05/25/21   [provider]  sertraline (ZOLOFT) 25 MG tablet Take 1 tablet (25 mg total) by mouth daily. 09/08/21   Einar Pheasant, MD  triamcinolone cream (KENALOG) 0.1 % Apply 1 application topically 2 (two) times daily. 06/27/19   Einar Pheasant, MD    Allergies Amoxicillin-pot clavulanate, Clarithromycin, Lodine [etodolac], Minocycline, Naproxen sodium, Rofecoxib, Vibramycin [doxycycline calcium], and Mobic [meloxicam]  Family History  Problem Relation Age of Onset   Heart disease Mother    Hypertension Mother    Colon cancer Sister    Sleep apnea Brother    Mental illness Other        sibling, suicide   Breast cancer Neg Hx    Thyroid disease Neg Hx     Social History Social History   Tobacco Use   Smoking status: Former    Types: Cigarettes    Quit date: 1999    Years since quitting: 23.8   Smokeless tobacco: Never  Vaping Use   Vaping  Use: Never used  Substance Use Topics   Alcohol use: No    Alcohol/week: 0.0 standard drinks   Drug use: No    Review of Systems Constitutional: No fever. Eyes: No visual changes. ENT: No sore throat. Cardiovascular: + chest pain. Respiratory: Denies shortness of breath. Gastrointestinal: No nausea, vomiting, diarrhea. Genitourinary: Negative for dysuria. Musculoskeletal: Negative for back pain. Skin: Negative for rash. Neurological: Negative for focal weakness or numbness.  ____________________________________________   PHYSICAL EXAM:  VITAL SIGNS: ED Triage Vitals  Enc Vitals Group     BP 09/10/21 2152 127/73     Pulse Rate 09/10/21 2152 60     Resp 09/10/21 2152 18     Temp 09/10/21 2152 98.3 F (36.8 C)     Temp Source 09/10/21 2152 Oral     SpO2 09/10/21 2152 97 %     Weight 09/10/21 2153 85 lb (38.6 kg)     Height 09/10/21 2153 4\' 11"  (1.499 m)     Head Circumference --  Peak Flow --      Pain Score 09/10/21 2152 6     Pain Loc --      Pain Edu? --      Excl. in [place]? --    CONSTITUTIONAL: Alert and oriented and responds appropriately to questions. Well-appearing; well-nourished, elderly, no distress HEAD: Normocephalic EYES: Conjunctivae clear, pupils appear equal, EOM appear intact ENT: normal nose; moist mucous membranes NECK: Supple, normal ROM CARD: RRR; S1 and S2 appreciated; no murmurs, no clicks, no rubs, no gallops RESP: Normal chest excursion without splinting or tachypnea; breath sounds clear and equal bilaterally; no wheezes, no rhonchi, no rales, no hypoxia or respiratory distress, speaking full sentences ABD/GI: Normal bowel sounds; non-distended; soft, non-tender, no rebound, no guarding, no peritoneal signs, no hepatosplenomegaly BACK: The back appears normal EXT: Normal ROM in all joints; no deformity noted, no edema; no cyanosis, no calf tenderness or calf swelling SKIN: Normal color for age and race; warm; no rash on exposed skin NEURO:  Moves all extremities equally PSYCH: The patient's mood and manner are appropriate.  ____________________________________________   LABS (all labs ordered are listed, but only abnormal results are displayed)  Labs Reviewed  BASIC METABOLIC PANEL - Abnormal; Notable for the following components:      Result Value   Sodium 134 (*)    Glucose, Bld 128 (*)    All other components within normal limits  CBC  TROPONIN I (HIGH SENSITIVITY)  TROPONIN I (HIGH SENSITIVITY)   ____________________________________________  EKG   EKG Interpretation  Date/Time:  Thursday September 10 2021 21:59:01 EDT Ventricular Rate:  69 PR Interval:  210 QRS Duration: 72 QT Interval:  428 QTC Calculation: 458 R Axis:   44 Text Interpretation: Sinus rhythm with 1st degree A-V block Possible Anterior infarct , age undetermined Abnormal ECG No significant change since last tracing in 2018 Confirmed by Pryor Curia [phone]) on 09/11/2021 12:41:04 AM        ____________________________________________  RADIOLOGY Jessie Foot Johntavius Shepard, personally viewed and evaluated these images (plain radiographs) as part of my medical decision making, as well as reviewing the written report by the radiologist.  ED MD interpretation: Chest x-ray clear.  Official radiology report(s): DG Chest 2 View  Result Date: 09/10/2021 CLINICAL DATA:  Chest pain EXAM: CHEST - 2 VIEW COMPARISON:  Chest x-ray 12/07/2016, CT chest 12/07/2016 FINDINGS: The heart and mediastinal contours are unchanged. Atherosclerotic plaque of the aorta. No focal consolidation. No pulmonary edema. No pleural effusion. No pneumothorax. No acute osseous abnormality. IMPRESSION: No active cardiopulmonary disease. Electronically Signed   By: Iven Finn M.D.   On: 09/10/2021 22:52    ____________________________________________   PROCEDURES  Procedure(s) performed (including Critical  Care):  Procedures    ____________________________________________   INITIAL IMPRESSION / ASSESSMENT AND PLAN / ED COURSE  As part of my medical decision making, I reviewed the following data within the [place] History obtained from family, Nursing notes reviewed and incorporated, Labs reviewed , EKG interpreted , Old EKG reviewed, Old chart reviewed, Radiograph reviewed , and Notes from prior ED visits         Patient here with complaints of chest pain.  She does have some risk factors for ACS but symptoms seem somewhat atypical.  EKG shows no new ischemic change compared to previous.  First troponin is negative.  Second is pending.  No signs of volume overload on exam and chest x-ray shows no pulmonary edema, infiltrate, pneumothorax, widened mediastinum, cardiomegaly.  Doubt PE, dissection especially given pain has resolved spontaneously.  Could be GI in nature such as GERD, esophageal spasm, esophagitis, gastritis given symptoms started after eating.  Given she is well-appearing, asymptomatic, hemodynamically stable, plan will be to discharge patient home if second troponin is flat with close outpatient cardiology follow-up.  She already has care established with Dr. Ubaldo Glassing.  Patient and husband at bedside are comfortable with this plan.  ED PROGRESS  Patient continues to be well-appearing and asymptomatic.  Repeat troponin flat.  Patient and family comfortable with plan for discharge home with close outpatient cardiology follow-up.  Discussed return precautions.  At this time, I do not feel there is any life-threatening condition present. I have reviewed, interpreted and discussed all results (EKG, imaging, lab, urine as appropriate) and exam findings with patient/family. I have reviewed nursing notes and appropriate previous records.  I feel the patient is safe to be discharged home without further emergent workup and can continue workup as an outpatient as needed.  Discussed usual and customary return precautions. Patient/family verbalize understanding and are comfortable with this plan.  Outpatient follow-up has been provided as needed. All questions have been answered.  ____________________________________________   FINAL CLINICAL IMPRESSION(S) / ED DIAGNOSES  Final diagnoses:  Nonspecific chest pain     ED Discharge Orders     None       *Please note:  Teresa Wade was evaluated in Emergency Department on 09/11/2021 for the symptoms described in the history of present illness. She was evaluated in the context of the global COVID-19 pandemic, which necessitated consideration that the patient might be at risk for infection with the SARS-CoV-2 virus that causes COVID-19. Institutional protocols and algorithms that pertain to the evaluation of patients at risk for COVID-19 are in a state of rapid change based on information released by regulatory bodies including the CDC and federal and state organizations. These policies and algorithms were followed during the patient's care in the ED.  Some ED evaluations and interventions may be delayed as a result of limited staffing during and the pandemic.*   Note:  This document was prepared using Dragon voice recognition software and may include unintentional dictation errors.    Kerem Gilmer, Delice Bison, DO 09/11/21 [phone]

## 2021-09-13 ENCOUNTER — Encounter: Payer: Self-pay | Admitting: Internal Medicine

## 2021-09-13 DIAGNOSIS — F32A Depression, unspecified: Secondary | ICD-10-CM | POA: Insufficient documentation

## 2021-09-13 NOTE — Assessment & Plan Note (Signed)
Had carotid ultrasound 02/2021 - some build up L>R.  No significant stenosis.  Continue risk factor modification.

## 2021-09-13 NOTE — Assessment & Plan Note (Signed)
Increased stress and depression related to her current medical issues as outlined.  Discussed with her today.  Start zoloft as directed.  Follow.

## 2021-09-13 NOTE — Assessment & Plan Note (Signed)
Continue losartan and amlodipine.  Follow pressures.  Follow metabolic panel.  

## 2021-09-13 NOTE — Assessment & Plan Note (Signed)
Continues.  Extensive w/up as outlined.  Discussed recent visit with Dr Vicente Males.  Discussed previous w/up.  Continue to encourage increased po intake.  Treat stress and depression with zoloft.  Discussed colonoscopy.  She will think about this and notify Dr Vicente Males if agreeable to proceed.

## 2021-09-13 NOTE — Assessment & Plan Note (Signed)
Off simvastatin.  Has stopped.  Will hold on restarting.  See if can sort through some of these GI issues.  Will d/w her more in the future.

## 2021-09-13 NOTE — Assessment & Plan Note (Signed)
Has lost a lot of weight.  Follow met b and a1c.

## 2021-09-13 NOTE — Assessment & Plan Note (Signed)
Off simvastatin. She stopped.  Hold on restarting as outlined.

## 2021-09-13 NOTE — Assessment & Plan Note (Signed)
S/p biopsy - pathology - atypia of undetermined significance.  Saw ENT and endocrinology.

## 2021-09-13 NOTE — Assessment & Plan Note (Signed)
Continued intermittent episodes of abdominal pain, nausea and vomiting.  Has had extensive GI w/up as outlined.  Previous EGD - gastritis.  Celiac testing negative.  S/p treatment for SIBO.  Off budesonide.  Has adjusted dietary intake. Continues to have pain and continues with weight loss.  Recent CT abdomen and pelvis as outlined.  Seeing Dr Vicente Males.  Discussed colonoscopy.  She is still hesitant to proceed.  Questions answered.  Start SSRI.  Follow.

## 2021-09-18 ENCOUNTER — Ambulatory Visit: Payer: Medicare Other | Admitting: Internal Medicine

## 2021-09-22 ENCOUNTER — Telehealth: Payer: Self-pay | Admitting: Internal Medicine

## 2021-09-22 NOTE — Telephone Encounter (Signed)
Thank you for the update.  I am glad is helping.  Let me know if any problems.

## 2021-09-22 NOTE — Telephone Encounter (Signed)
FYI- Per last note, patient was started on zoloft last visit- has f/u with you 11/11. Seems to be doing ok.

## 2021-09-22 NOTE — Telephone Encounter (Signed)
Patient called in and would like for Dr.Scott to know that her new medicine seems to be working and she has been having less stomach pain since taking the medicine.

## 2021-09-22 NOTE — Telephone Encounter (Signed)
LM for patient

## 2021-10-09 ENCOUNTER — Other Ambulatory Visit: Payer: Self-pay

## 2021-10-09 ENCOUNTER — Encounter: Payer: Self-pay | Admitting: Internal Medicine

## 2021-10-09 ENCOUNTER — Ambulatory Visit (INDEPENDENT_AMBULATORY_CARE_PROVIDER_SITE_OTHER): Payer: Medicare Other | Admitting: Internal Medicine

## 2021-10-09 DIAGNOSIS — I10 Essential (primary) hypertension: Secondary | ICD-10-CM | POA: Diagnosis not present

## 2021-10-09 DIAGNOSIS — I779 Disorder of arteries and arterioles, unspecified: Secondary | ICD-10-CM | POA: Diagnosis not present

## 2021-10-09 DIAGNOSIS — E041 Nontoxic single thyroid nodule: Secondary | ICD-10-CM

## 2021-10-09 DIAGNOSIS — K219 Gastro-esophageal reflux disease without esophagitis: Secondary | ICD-10-CM | POA: Diagnosis not present

## 2021-10-09 DIAGNOSIS — E7800 Pure hypercholesterolemia, unspecified: Secondary | ICD-10-CM

## 2021-10-09 DIAGNOSIS — I700 Atherosclerosis of aorta: Secondary | ICD-10-CM

## 2021-10-09 DIAGNOSIS — E1165 Type 2 diabetes mellitus with hyperglycemia: Secondary | ICD-10-CM

## 2021-10-09 DIAGNOSIS — F32A Depression, unspecified: Secondary | ICD-10-CM

## 2021-10-09 DIAGNOSIS — Z86010 Personal history of colonic polyps: Secondary | ICD-10-CM

## 2021-10-09 DIAGNOSIS — R109 Unspecified abdominal pain: Secondary | ICD-10-CM

## 2021-10-09 DIAGNOSIS — R634 Abnormal weight loss: Secondary | ICD-10-CM

## 2021-10-09 MED ORDER — SERTRALINE HCL 50 MG PO TABS: 50.0000 mg | ORAL_TABLET | Freq: Every day | ORAL | 1 refills | Status: AC

## 2021-10-09 NOTE — Progress Notes (Signed)
Patient ID: Teresa Wade, female   DOB: [birth year], 87 y.o.   MRN: [id number]   Subjective:    Patient ID: Teresa Wade, female    DOB: 1931-12-21, 87 y.o.   MRN: [id number]  This visit occurred during the SARS-CoV-2 public health emergency.  Safety protocols were in place, including screening questions prior to the visit, additional usage of staff PPE, and extensive cleaning of exam room while observing appropriate contact time as indicated for disinfecting solutions.   Patient here for a scheduled follow up.   HPI Here to follow up regarding her GI issues and increased stress and weight loss.  Was started on zoloft 65m q day last visit.  Feels may be helping some.  Saw Dr FUbaldo Glassingin f/u 10/06/21.  Functional study - no ischemia.  ECHO - preserved LV function with no structural abnormalities.  Holter - rare PVCs and PACs.  No changes made.  Recommended f/u in 6 months.  GI symptoms improved.  Occasional vomiting, but decreased.  Not as much pain.  Feels zoloft is helping.  Discussed increasing dose.     Past Medical History:  Diagnosis Date   Abnormal chest CT    right hilar cyst   Anxiety    Fibrocystic disease of breast    GERD (gastroesophageal reflux disease)    History of thrombocytopenia    Hypercholesterolemia    Hyperglycemia    Hypertension    Osteoarthritis    Osteoporosis    s/p Actonel, Reclast (last 2011)   Palpitations    Pre-diabetes    Psoriasis    Reflux esophagitis    Past Surgical History:  Procedure Laterality Date   ABDOMINAL HYSTERECTOMY  1980   BREAST EXCISIONAL BIOPSY Left 1976   benign   COLONOSCOPY WITH PROPOFOL N/A 05/05/2018   Procedure: COLONOSCOPY WITH PROPOFOL;  Surgeon: EManya Silvas MD;  [facility]  Service: Endoscopy;  Laterality: N/A;   ESOPHAGOGASTRODUODENOSCOPY (EGD) WITH PROPOFOL N/A 05/05/2018   Procedure: ESOPHAGOGASTRODUODENOSCOPY (EGD) WITH PROPOFOL;  Surgeon: EManya Silvas MD;  [facility];   Service: Endoscopy;  Laterality: N/A;   ESOPHAGOGASTRODUODENOSCOPY (EGD) WITH PROPOFOL N/A 04/16/2021   Procedure: ESOPHAGOGASTRODUODENOSCOPY (EGD) WITH PROPOFOL;  Surgeon: AJonathon Bellows MD;  [facility];  Service: Gastroenterology;  Laterality: N/A;   THUMB AMPUTATION Left 2015   Family History  Problem Relation Age of Onset   Heart disease Mother    Hypertension Mother    Colon cancer Sister    Sleep apnea Brother    Mental illness Other        sibling, suicide   Breast cancer Neg Hx    Thyroid disease Neg Hx    Social History   Socioeconomic History   Marital status: Married    Spouse name: Not on file   Number of children: 1   Years of education: Not on file   Highest education level: Not on file  Occupational History   Not on file  Tobacco Use   Smoking status: Former    Types: Cigarettes    Quit date: 1999    Years since quitting: 23.9   Smokeless tobacco: Never  Vaping Use   Vaping Use: Never used  Substance and Sexual Activity   Alcohol use: No    Alcohol/week: 0.0 standard drinks   Drug use: No   Sexual activity: Never  Other Topics Concern   Not on file  Social History Narrative   Not on file   Social Determinants  of Health   Financial Resource Strain: Low Risk    Difficulty of Paying Living Expenses: Not hard at all  Food Insecurity: No Food Insecurity   Worried About [place] in the Last Year: Never true   [place] in the Last Year: Never true  Transportation Needs: No Transportation Needs   Lack of Transportation (Medical): No   Lack of Transportation (Non-Medical): No  Physical Activity: Insufficiently Active   Days of Exercise per Week: 2 days   Minutes of Exercise per Session: 30 min  Stress: No Stress Concern Present   Feeling of Stress : Not at all  Social Connections: Unknown   Frequency of Communication with Friends and Family: Not on file   Frequency of Social Gatherings with Friends and Family: Not on file    Attends Religious Services: Not on file   Active Member of Clubs or Organizations: Not on file   Attends Archivist Meetings: Not on file   Marital Status: Married     Review of Systems  Constitutional:        Feels appetite is relatively stable.  Weight down a pound from last check.   HENT:  Negative for congestion and sinus pressure.   Respiratory:  Negative for cough, chest tightness and shortness of breath.   Cardiovascular:  Negative for chest pain, palpitations and leg swelling.  Gastrointestinal:  Positive for abdominal distention, nausea and vomiting.  Genitourinary:  Negative for difficulty urinating and dysuria.  Musculoskeletal:  Negative for joint swelling and myalgias.  Skin:  Negative for color change and rash.  Neurological:  Negative for dizziness, light-headedness and headaches.  Psychiatric/Behavioral:  Negative for agitation and dysphoric mood.       Objective:     BP 128/72   Pulse (!) 58   Temp (!) 96.7 F (35.9 C)   Ht 4' 11.02" (1.499 m)   Wt 84 lb (38.1 kg)   LMP 11/09/1979   SpO2 97%   BMI 16.96 kg/m  Wt Readings from Last 3 Encounters:  10/09/21 84 lb (38.1 kg)  09/10/21 85 lb (38.6 kg)  09/08/21 87 lb 12.8 oz (39.8 kg)    Physical Exam Vitals reviewed.  Constitutional:      General: She is not in acute distress.    Appearance: Normal appearance.  HENT:     Head: Normocephalic and atraumatic.     Right Ear: External ear normal.     Left Ear: External ear normal.  Eyes:     General: No scleral icterus.       Right eye: No discharge.        Left eye: No discharge.     Conjunctiva/sclera: Conjunctivae normal.  Neck:     Thyroid: No thyromegaly.  Cardiovascular:     Rate and Rhythm: Normal rate and regular rhythm.  Pulmonary:     Effort: No respiratory distress.     Breath sounds: Normal breath sounds. No wheezing.  Abdominal:     General: Bowel sounds are normal.     Palpations: Abdomen is soft.     Tenderness: There  is no abdominal tenderness.  Musculoskeletal:        General: No swelling or tenderness.     Cervical back: Neck supple. No tenderness.  Lymphadenopathy:     Cervical: No cervical adenopathy.  Skin:    Findings: No erythema or rash.  Neurological:     Mental Status: She is alert.  Psychiatric:  Mood and Affect: Mood normal.        Behavior: Behavior normal.     Outpatient Encounter Medications as of 10/09/2021  Medication Sig   acebutolol (SECTRAL) 200 MG capsule TAKE 2 CAPSULES BY MOUTH EVERY MORNING & 1 CAPSULE EVERY EVENING   amLODipine (NORVASC) 5 MG tablet TAKE ONE TABLET (5 MG) BY MOUTH EVERY DAY   aspirin 81 MG tablet Take 81 mg by mouth daily.   calcium elemental as carbonate (BARIATRIC TUMS ULTRA) 400 MG chewable tablet Chew by mouth.   clobetasol ointment (TEMOVATE) 8.28 % Apply 1 application topically 2 (two) times daily.   losartan (COZAAR) 100 MG tablet TAKE ONE TABLET (100 MG) BY MOUTH EVERY DAY   metoCLOPramide (REGLAN) 5 MG tablet Take 1 tablet (5 mg total) by mouth every 8 (eight) hours as needed for nausea or vomiting.   nitroGLYCERIN (NITROSTAT) 0.4 MG SL tablet Place 1 tablet (0.4 mg total) under the tongue every 5 (five) minutes as needed for chest pain. May repeat x 1.  If persistent pain, call 911 (Patient taking differently: Place 0.4 mg under the tongue every 5 (five) minutes as needed for chest pain. May repeat x 1.  If persistent pain, call 911)   omeprazole (PRILOSEC) 20 MG capsule Take 20 mg by mouth 2 (two) times daily.   sertraline (ZOLOFT) 50 MG tablet Take 1 tablet (50 mg total) by mouth daily.   triamcinolone cream (KENALOG) 0.1 % Apply 1 application topically 2 (two) times daily.   [DISCONTINUED] sertraline (ZOLOFT) 25 MG tablet Take 1 tablet (25 mg total) by mouth daily.   No facility-administered encounter medications on file as of 10/09/2021.     Lab Results  Component Value Date   WBC 7.3 09/10/2021   HGB 12.7 09/10/2021   HCT 36.3  09/10/2021   PLT 305 09/10/2021   GLUCOSE 128 (H) 09/10/2021   CHOL 157 05/19/2021   TRIG 114.0 05/19/2021   HDL 48.30 05/19/2021   LDLDIRECT 75.0 01/06/2018   LDLCALC 86 05/19/2021   ALT 10 05/19/2021   AST 16 05/19/2021   NA 134 (L) 09/10/2021   K 4.0 09/10/2021   CL 99 09/10/2021   CREATININE 0.80 09/10/2021   BUN 17 09/10/2021   CO2 24 09/10/2021   TSH 1.16 05/19/2021   HGBA1C 6.3 05/19/2021   MICROALBUR <0.7 08/27/2020       Assessment & Plan:   Problem List Items Addressed This Visit     Abdominal pain     Has had extensive GI w/up as outlined.  Previous EGD - gastritis.  Celiac testing negative.  S/p treatment for SIBO.  Off budesonide.  Has adjusted dietary intake.  Recent CT abdomen and pelvis as outlined.  Seeing Dr Vicente Males.  Discussed colonoscopy.  She is still hesitant to proceed.  Started on zoloft last visit.  Pain is better.  Feels better.  Increase zoloft to 27m q day.  Follow.       Aortic atherosclerosis (HCC)    Off simvastatin.  Has stopped.  Will hold on restarting.  See if can sort through some of these GI issues.  Will d/w her more in the future.        Carotid artery disease ([place]    Had carotid ultrasound 02/2021 - some build up L>R.  No significant stenosis.  Continue risk factor modification.       Essential hypertension    Continue losartan and amlodipine.  Follow pressures.  Follow metabolic panel.  GERD (gastroesophageal reflux disease)    On prilosec.  Also takes carafate.  Symptoms have improved.  Follow.        History of colonic polyps    Colonoscopy 04/2018 - mild active colitis.  Discussed f/u colonoscopy.  Has wanted to hold.  Will notify me or Dr Vicente Males if changes her mind.       Hypercholesterolemia    Off simvastatin. She stopped.  Hold on restarting as outlined.        Mild depression    Started on zoloft.  Taking 85m q day.  Feeling better.  Increase to 525mq day.  Abdominal pain improved.  Follow.       Relevant  Medications   sertraline (ZOLOFT) 50 MG tablet   Thyroid nodule    S/p biopsy - pathology - atypia of undetermined significance.  Saw ENT and endocrinology.        Type 2 diabetes mellitus with hyperglycemia (HCC)    Has lost a lot of weight.  Follow met b and a1c.        Weight loss    Down 1 pound from last visit.  Extensive w/up as outlined. Seeing Dr AnVicente Males Discussed previous w/up.  Continue to encourage increased po intake.  Treating stress and depression with zoloft.  Discussed colonoscopy.  She will think about this and notify Dr AnVicente Malesor me) if agreeable to proceed.  Increase zoloft.  Has helped.          ChEinar PheasantMD

## 2021-10-18 ENCOUNTER — Encounter: Payer: Self-pay | Admitting: Internal Medicine

## 2021-10-18 NOTE — Assessment & Plan Note (Signed)
Down 1 pound from last visit.  Extensive w/up as outlined. Seeing Dr Vicente Males.  Discussed previous w/up.  Continue to encourage increased po intake.  Treating stress and depression with zoloft.  Discussed colonoscopy.  She will think about this and notify Dr Vicente Males (or me) if agreeable to proceed.  Increase zoloft.  Has helped.

## 2021-10-18 NOTE — Assessment & Plan Note (Signed)
Has lost a lot of weight.  Follow met b and a1c.

## 2021-10-18 NOTE — Assessment & Plan Note (Signed)
Continue losartan and amlodipine.  Follow pressures.  Follow metabolic panel.  

## 2021-10-18 NOTE — Assessment & Plan Note (Signed)
Off simvastatin. She stopped.  Hold on restarting as outlined.

## 2021-10-18 NOTE — Assessment & Plan Note (Signed)
Off simvastatin.  Has stopped.  Will hold on restarting.  See if can sort through some of these GI issues.  Will d/w her more in the future.

## 2021-10-18 NOTE — Assessment & Plan Note (Signed)
Had carotid ultrasound 02/2021 - some build up L>R.  No significant stenosis.  Continue risk factor modification.

## 2021-10-18 NOTE — Assessment & Plan Note (Signed)
S/p biopsy - pathology - atypia of undetermined significance.  Saw ENT and endocrinology.

## 2021-10-18 NOTE — Assessment & Plan Note (Signed)
Colonoscopy 04/2018 - mild active colitis.  Discussed f/u colonoscopy.  Has wanted to hold.  Will notify me or Dr Vicente Males if changes her mind.

## 2021-10-18 NOTE — Assessment & Plan Note (Signed)
Started on zoloft.  Taking 25mg  q day.  Feeling better.  Increase to 50mg  q day.  Abdominal pain improved.  Follow.

## 2021-10-18 NOTE — Assessment & Plan Note (Signed)
On prilosec.  Also takes carafate.  Symptoms have improved.  Follow.

## 2021-10-18 NOTE — Assessment & Plan Note (Addendum)
Has had extensive GI w/up as outlined.  Previous EGD - gastritis.  Celiac testing negative.  S/p treatment for SIBO.  Off budesonide.  Has adjusted dietary intake.  Recent CT abdomen and pelvis as outlined.  Seeing Dr Vicente Males.  Discussed colonoscopy.  She is still hesitant to proceed.  Started on zoloft last visit.  Pain is better.  Feels better.  Increase zoloft to 50mg  q day.  Follow.

## 2021-11-20 ENCOUNTER — Other Ambulatory Visit: Payer: Self-pay

## 2021-11-20 ENCOUNTER — Ambulatory Visit (INDEPENDENT_AMBULATORY_CARE_PROVIDER_SITE_OTHER): Payer: Medicare Other | Admitting: Internal Medicine

## 2021-11-20 VITALS — BP 120/70 | HR 58 | Temp 98.0°F | Resp 16 | Ht 59.0 in | Wt 76.0 lb

## 2021-11-20 DIAGNOSIS — Z860100 Personal history of colon polyps, unspecified: Secondary | ICD-10-CM

## 2021-11-20 DIAGNOSIS — E041 Nontoxic single thyroid nodule: Secondary | ICD-10-CM

## 2021-11-20 DIAGNOSIS — R109 Unspecified abdominal pain: Secondary | ICD-10-CM

## 2021-11-20 DIAGNOSIS — E1165 Type 2 diabetes mellitus with hyperglycemia: Secondary | ICD-10-CM

## 2021-11-20 DIAGNOSIS — I10 Essential (primary) hypertension: Secondary | ICD-10-CM | POA: Diagnosis not present

## 2021-11-20 DIAGNOSIS — R634 Abnormal weight loss: Secondary | ICD-10-CM | POA: Diagnosis not present

## 2021-11-20 DIAGNOSIS — I700 Atherosclerosis of aorta: Secondary | ICD-10-CM

## 2021-11-20 DIAGNOSIS — I779 Disorder of arteries and arterioles, unspecified: Secondary | ICD-10-CM | POA: Diagnosis not present

## 2021-11-20 DIAGNOSIS — F32A Depression, unspecified: Secondary | ICD-10-CM

## 2021-11-20 DIAGNOSIS — E7800 Pure hypercholesterolemia, unspecified: Secondary | ICD-10-CM

## 2021-11-20 DIAGNOSIS — K219 Gastro-esophageal reflux disease without esophagitis: Secondary | ICD-10-CM

## 2021-11-20 LAB — HEPATIC FUNCTION PANEL
ALT: 13 U/L (ref 0–35)
AST: 20 U/L (ref 0–37)
Albumin: 3.5 g/dL (ref 3.5–5.2)
Alkaline Phosphatase: 95 U/L (ref 39–117)
Bilirubin, Direct: 0.2 mg/dL (ref 0.0–0.3)
Total Bilirubin: 0.8 mg/dL (ref 0.2–1.2)
Total Protein: 6.6 g/dL (ref 6.0–8.3)

## 2021-11-20 LAB — BASIC METABOLIC PANEL
BUN: 17 mg/dL (ref 6–23)
CO2: 31 mEq/L (ref 19–32)
Calcium: 9.3 mg/dL (ref 8.4–10.5)
Chloride: 97 mEq/L (ref 96–112)
Creatinine, Ser: 0.72 mg/dL (ref 0.40–1.20)
GFR: 74.93 mL/min (ref >60.00)
Glucose, Bld: 114 mg/dL — ABNORMAL HIGH (ref 70–99)
Potassium: 4 mEq/L (ref 3.5–5.1)
Sodium: 136 mEq/L (ref 135–145)

## 2021-11-20 LAB — TSH: TSH: 0.92 u[IU]/mL (ref 0.35–5.50)

## 2021-11-20 NOTE — Progress Notes (Signed)
Patient ID: Teresa Wade, female   DOB: [birth year], 87 y.o.   MRN: [id number]   Subjective:    Patient ID: Teresa Wade, female    DOB: Jul 25, 1932, 87 y.o.   MRN: [id number]  This visit occurred during the SARS-CoV-2 public health emergency.  Safety protocols were in place, including screening questions prior to the visit, additional usage of staff PPE, and extensive cleaning of exam room while observing appropriate contact time as indicated for disinfecting solutions.   Patient here for a scheduled follow up.   Chief Complaint  Patient presents with   Weight Loss   .   HPI Here to follow up regarding her GI issues, increased stress and weight loss.  Previously started on zoloft.  Zoloft was increased to $RemoveBefo'50mg'qLuRzDvipnD$  q day last visit.  She does feel this has helped - with the increased stress.  She also reports the pain in her stomach is not as bad.  Still has flares, but not as bad.  She is eating some, but decreased appetite.  Still losing weight.  No chest pain or sob reported.     Past Medical History:  Diagnosis Date   Abnormal chest CT    right hilar cyst   Anxiety    Fibrocystic disease of breast    GERD (gastroesophageal reflux disease)    History of thrombocytopenia    Hypercholesterolemia    Hyperglycemia    Hypertension    Osteoarthritis    Osteoporosis    s/p Actonel, Reclast (last 2011)   Palpitations    Pre-diabetes    Psoriasis    Reflux esophagitis    Past Surgical History:  Procedure Laterality Date   ABDOMINAL HYSTERECTOMY  1980   BREAST EXCISIONAL BIOPSY Left 1976   benign   COLONOSCOPY WITH PROPOFOL N/A 05/05/2018   Procedure: COLONOSCOPY WITH PROPOFOL;  Surgeon: Manya Silvas, MD;  [facility];  Service: Endoscopy;  Laterality: N/A;   ESOPHAGOGASTRODUODENOSCOPY (EGD) WITH PROPOFOL N/A 05/05/2018   Procedure: ESOPHAGOGASTRODUODENOSCOPY (EGD) WITH PROPOFOL;  Surgeon: Manya Silvas, MD;  [facility];  Service: Endoscopy;   Laterality: N/A;   ESOPHAGOGASTRODUODENOSCOPY (EGD) WITH PROPOFOL N/A 04/16/2021   Procedure: ESOPHAGOGASTRODUODENOSCOPY (EGD) WITH PROPOFOL;  Surgeon: Jonathon Bellows, MD;  Location: [facility];  Service: Gastroenterology;  Laterality: N/A;   THUMB AMPUTATION Left 2015   Family History  Problem Relation Age of Onset   Heart disease Mother    Hypertension Mother    Colon cancer Sister    Sleep apnea Brother    Mental illness Other        sibling, suicide   Breast cancer Neg Hx    Thyroid disease Neg Hx    Social History   Socioeconomic History   Marital status: Married    Spouse name: Not on file   Number of children: 1   Years of education: Not on file   Highest education level: Not on file  Occupational History   Not on file  Tobacco Use   Smoking status: Former    Types: Cigarettes    Quit date: 1999    Years since quitting: 24.0   Smokeless tobacco: Never  Vaping Use   Vaping Use: Never used  Substance and Sexual Activity   Alcohol use: No    Alcohol/week: 0.0 standard drinks   Drug use: No   Sexual activity: Never  Other Topics Concern   Not on file  Social History Narrative   Not on file  Social Determinants of Health   Financial Resource Strain: Low Risk    Difficulty of Paying Living Expenses: Not hard at all  Food Insecurity: No Food Insecurity   Worried About [place] in the Last Year: Never true   [place] in the Last Year: Never true  Transportation Needs: No Transportation Needs   Lack of Transportation (Medical): No   Lack of Transportation (Non-Medical): No  Physical Activity: Insufficiently Active   Days of Exercise per Week: 2 days   Minutes of Exercise per Session: 30 min  Stress: No Stress Concern Present   Feeling of Stress : Not at all  Social Connections: Unknown   Frequency of Communication with Friends and Family: Not on file   Frequency of Social Gatherings with Friends and Family: Not on file   Attends Religious  Services: Not on file   Active Member of Clubs or Organizations: Not on file   Attends Archivist Meetings: Not on file   Marital Status: Married     Review of Systems  Constitutional:        Decreased weight.  Continues to lose weight.   HENT:  Negative for congestion and sinus pressure.   Respiratory:  Negative for cough, chest tightness and shortness of breath.   Cardiovascular:  Negative for chest pain, palpitations and leg swelling.  Gastrointestinal:        Abdominal pain as outlined.  Intermittent episodes.  Pain not as bad.  No recent vomiting.   Genitourinary:  Negative for difficulty urinating and dysuria.  Musculoskeletal:  Negative for joint swelling and myalgias.  Skin:  Negative for color change and rash.  Neurological:  Negative for dizziness, light-headedness and headaches.  Psychiatric/Behavioral:  Negative for agitation.        Feels she is doing some better since starting zoloft.       Objective:     BP 120/70    Pulse (!) 58    Temp 98 F (36.7 C)    Resp 16    Ht $R'4\' 11"'fc$  (1.499 m)    Wt 76 lb (34.5 kg)    LMP 11/09/1979    SpO2 97%    BMI 15.35 kg/m  Wt Readings from Last 3 Encounters:  11/20/21 76 lb (34.5 kg)  10/09/21 84 lb (38.1 kg)  09/10/21 85 lb (38.6 kg)    Physical Exam Vitals reviewed.  Constitutional:      General: She is not in acute distress.    Appearance: Normal appearance.  HENT:     Head: Normocephalic and atraumatic.     Right Ear: External ear normal.     Left Ear: External ear normal.  Eyes:     General: No scleral icterus.       Right eye: No discharge.        Left eye: No discharge.     Conjunctiva/sclera: Conjunctivae normal.  Neck:     Thyroid: No thyromegaly.  Cardiovascular:     Rate and Rhythm: Normal rate and regular rhythm.  Pulmonary:     Effort: No respiratory distress.     Breath sounds: Normal breath sounds. No wheezing.  Abdominal:     General: Bowel sounds are normal.     Palpations: Abdomen is  soft.     Tenderness: There is no abdominal tenderness.  Musculoskeletal:        General: No swelling or tenderness.     Cervical back: Neck supple. No tenderness.  Lymphadenopathy:     Cervical: No cervical adenopathy.  Skin:    Findings: No erythema or rash.  Neurological:     Mental Status: She is alert.  Psychiatric:        Mood and Affect: Mood normal.        Behavior: Behavior normal.     Outpatient Encounter Medications as of 11/20/2021  Medication Sig   acebutolol (SECTRAL) 200 MG capsule TAKE 2 CAPSULES BY MOUTH EVERY MORNING & 1 CAPSULE EVERY EVENING   amLODipine (NORVASC) 5 MG tablet TAKE ONE TABLET (5 MG) BY MOUTH EVERY DAY   aspirin 81 MG tablet Take 81 mg by mouth daily.   calcium elemental as carbonate (BARIATRIC TUMS ULTRA) 400 MG chewable tablet Chew by mouth.   clobetasol ointment (TEMOVATE) 6.06 % Apply 1 application topically 2 (two) times daily.   losartan (COZAAR) 100 MG tablet TAKE ONE TABLET (100 MG) BY MOUTH EVERY DAY   nitroGLYCERIN (NITROSTAT) 0.4 MG SL tablet Place 1 tablet (0.4 mg total) under the tongue every 5 (five) minutes as needed for chest pain. May repeat x 1.  If persistent pain, call 911 (Patient taking differently: Place 0.4 mg under the tongue every 5 (five) minutes as needed for chest pain. May repeat x 1.  If persistent pain, call 911)   omeprazole (PRILOSEC) 20 MG capsule Take 20 mg by mouth 2 (two) times daily.   sertraline (ZOLOFT) 50 MG tablet Take 1 tablet (50 mg total) by mouth daily.   triamcinolone cream (KENALOG) 0.1 % Apply 1 application topically 2 (two) times daily.   metoCLOPramide (REGLAN) 5 MG tablet Take 1 tablet (5 mg total) by mouth every 8 (eight) hours as needed for nausea or vomiting.   No facility-administered encounter medications on file as of 11/20/2021.     Lab Results  Component Value Date   WBC 7.3 09/10/2021   HGB 12.7 09/10/2021   HCT 36.3 09/10/2021   PLT 305 09/10/2021   GLUCOSE 114 (H) 11/20/2021    CHOL 157 05/19/2021   TRIG 114.0 05/19/2021   HDL 48.30 05/19/2021   LDLDIRECT 75.0 01/06/2018   LDLCALC 86 05/19/2021   ALT 13 11/20/2021   AST 20 11/20/2021   NA 136 11/20/2021   K 4.0 11/20/2021   CL 97 11/20/2021   CREATININE 0.72 11/20/2021   BUN 17 11/20/2021   CO2 31 11/20/2021   TSH 0.92 11/20/2021   HGBA1C 6.3 05/19/2021   MICROALBUR <0.7 08/27/2020       Assessment & Plan:   Problem List Items Addressed This Visit     Abdominal pain     Has had extensive GI w/up as outlined.  Previous EGD - gastritis.  Celiac testing negative.  S/p treatment for SIBO.  Off budesonide.  Has adjusted dietary intake.  Recent CT abdomen and pelvis as outlined.  Seeing Dr Vicente Males.  Discussed colonoscopy.  She is agreeable for referral back - for colonoscopy/further w/up.   On zoloft.  Pain is better.        Aortic atherosclerosis (HCC)    Off simvastatin. She stopped.  Hold on restarting as outlined.        Carotid artery disease ([place])    Had carotid ultrasound 02/2021 - some build up L>R.  No significant stenosis.  Continue risk factor modification.       Essential hypertension    Continue losartan and amlodipine.  Follow pressures.  Follow metabolic panel.       GERD (gastroesophageal  reflux disease)    On prilosec.   No acid reflux reported.        History of colonic polyps    Colonoscopy 04/2018 - mild active colitis.  Discussed f/u colonoscopy.  Agrees for f/u with Dr Vicente Males to discuss further w/up including colonoscopy.        Hypercholesterolemia    Off simvastatin. She stopped.  Hold on restarting as outlined.        Mild depression    On zoloft $RemoveB'50mg'CsbncHJG$  q day now.  Feels doing better on the medication.  Follow.       Thyroid nodule    S/p biopsy - pathology - atypia of undetermined significance.  Saw ENT and endocrinology.        Type 2 diabetes mellitus with hyperglycemia (HCC)    Has lost a lot of weight.  Check met b and a1c.        Weight loss - Primary    Weight  continues to decline.  Extensive w/up as outlined in previous note. Seeing Dr Vicente Males.  Discussed previous w/up.  Continue to encourage increased po intake.  Treating stress and depression with zoloft.  Does feel is better.  Discussed colonoscopy.  Has previously declined.  Given persistent weight loss, she is agreeable to follow up with Dr Vicente Males and proceed with his recommended - further w/up.          Relevant Orders   Hepatic function panel (Completed)   TSH (Completed)   Basic metabolic panel (Completed)     Einar Pheasant, MD

## 2021-11-23 ENCOUNTER — Encounter: Payer: Self-pay | Admitting: Internal Medicine

## 2021-11-23 NOTE — Assessment & Plan Note (Addendum)
On prilosec.   No acid reflux reported.

## 2021-11-23 NOTE — Assessment & Plan Note (Signed)
Had carotid ultrasound 02/2021 - some build up L>R.  No significant stenosis.  Continue risk factor modification.

## 2021-11-23 NOTE — Assessment & Plan Note (Signed)
Weight continues to decline.  Extensive w/up as outlined in previous note. Seeing Dr Vicente Males.  Discussed previous w/up.  Continue to encourage increased po intake.  Treating stress and depression with zoloft.  Does feel is better.  Discussed colonoscopy.  Has previously declined.  Given persistent weight loss, she is agreeable to follow up with Dr Vicente Males and proceed with his recommended - further w/up.

## 2021-11-23 NOTE — Assessment & Plan Note (Signed)
Off simvastatin. She stopped.  Hold on restarting as outlined.

## 2021-11-23 NOTE — Assessment & Plan Note (Signed)
Continue losartan and amlodipine.  Follow pressures.  Follow metabolic panel.  

## 2021-11-23 NOTE — Assessment & Plan Note (Signed)
On zoloft 50mg  q day now.  Feels doing better on the medication.  Follow.

## 2021-11-23 NOTE — Assessment & Plan Note (Signed)
S/p biopsy - pathology - atypia of undetermined significance.  Saw ENT and endocrinology.

## 2021-11-23 NOTE — Assessment & Plan Note (Signed)
Has lost a lot of weight.  Check met b and a1c.

## 2021-11-23 NOTE — Assessment & Plan Note (Signed)
Has had extensive GI w/up as outlined.  Previous EGD - gastritis.  Celiac testing negative.  S/p treatment for SIBO.  Off budesonide.  Has adjusted dietary intake.  Recent CT abdomen and pelvis as outlined.  Seeing Dr Vicente Males.  Discussed colonoscopy.  She is agreeable for referral back - for colonoscopy/further w/up.   On zoloft.  Pain is better.

## 2021-11-23 NOTE — Assessment & Plan Note (Signed)
Colonoscopy 04/2018 - mild active colitis.  Discussed f/u colonoscopy.  Agrees for f/u with Dr Vicente Males to discuss further w/up including colonoscopy.

## 2021-11-24 ENCOUNTER — Telehealth: Payer: Self-pay | Admitting: Internal Medicine

## 2021-11-24 NOTE — Telephone Encounter (Signed)
LM for [facility] to call back

## 2021-11-24 NOTE — Telephone Encounter (Signed)
She sees Dr Vicente Males.  He has wanted to do colonoscopy. She has previously declined.  She is agreeable now.  Please call and schedule her an appt with Dr Vicente Males.  Persistent weight loss, abdominal pain.  I have messaged Dr Vicente Males as well.

## 2021-11-25 NOTE — Telephone Encounter (Signed)
LMTCB

## 2021-11-26 ENCOUNTER — Telehealth: Payer: Self-pay | Admitting: Internal Medicine

## 2021-11-26 NOTE — Telephone Encounter (Signed)
-----   Message from Jonathon Bellows, MD sent at 11/25/2021  9:30 AM EST ----- Regarding: RE: update Good morning Dr. Nicki Reaper, I will have my office contact her to schedule a visit to discuss this further and we will proceed with evaluation if she agrees.  I think it has been over 5 months since I have last seen her.   Maritza  Can you please offer her an office visit next week.  I am on call but I can still see her in the afternoon if needed.  Regards  Kiran  ----- Message ----- From: Einar Pheasant, MD Sent: 11/23/2021   8:17 PM EST To: Jonathon Bellows, MD Subject: update                                         I had messaged you earlier with an update on Ms Downard.  I had started her on zoloft and she (in some ways is doing better), but continues to lose weight.  Per your last visit and our last discussion, you had recommended a follow up colonoscopy.  She had previously refused.  She is agreeable now, given her persistent symptoms and persistent weight loss.  I will have my nurse call and arrange a follow up appt with you to discuss.  Just wanted you to be aware.  Thank you again for seeing her and helping take care of her.    Hope you had a nice Christmas.  Einar Pheasant

## 2021-12-01 ENCOUNTER — Telehealth: Payer: Self-pay

## 2021-12-01 NOTE — Telephone Encounter (Signed)
Called patient but had to leave her a voicemail letting her know that her PCP wanted her to be seen. Therefore, she is to call us back to schedule an appointment.

## 2021-12-01 NOTE — Telephone Encounter (Signed)
Per note from GI, they have called patient to scheduled and left her a message to return call to their office.

## 2021-12-01 NOTE — Telephone Encounter (Signed)
Pt has been scheduled for 01/20/22. I did advise the pt if we have anything sooner we will give her a call.

## 2021-12-01 NOTE — Telephone Encounter (Signed)
-----   Message from Jonathon Bellows, MD sent at 11/25/2021  9:30 AM EST ----- Regarding: RE: update Good morning Dr. Nicki Reaper, I will have my office contact her to schedule a visit to discuss this further and we will proceed with evaluation if she agrees.  I think it has been over 5 months since I have last seen her.   Kyrian Stage  Can you please offer her an office visit next week.  I am on call but I can still see her in the afternoon if needed.  Regards  Kiran  ----- Message ----- From: Einar Pheasant, MD Sent: 11/23/2021   8:17 PM EST To: Jonathon Bellows, MD Subject: update                                         I had messaged you earlier with an update on Ms Nylund.  I had started her on zoloft and she (in some ways is doing better), but continues to lose weight.  Per your last visit and our last discussion, you had recommended a follow up colonoscopy.  She had previously refused.  She is agreeable now, given her persistent symptoms and persistent weight loss.  I will have my nurse call and arrange a follow up appt with you to discuss.  Just wanted you to be aware.  Thank you again for seeing her and helping take care of her.    Hope you had a nice Christmas.  Einar Pheasant

## 2021-12-07 ENCOUNTER — Other Ambulatory Visit: Payer: Self-pay | Admitting: Internal Medicine

## 2021-12-08 ENCOUNTER — Telehealth: Payer: Self-pay | Admitting: Internal Medicine

## 2021-12-08 NOTE — Telephone Encounter (Signed)
Ok to refill omeprazole for her?

## 2021-12-08 NOTE — Telephone Encounter (Signed)
LMTCB

## 2021-12-08 NOTE — Telephone Encounter (Signed)
Pt would like a refill for omeprazole (PRILOSEC) 20 MG capsule Pt states she was prescribed this during her ED visit back in June and she would like to continue this medication. Pt uses [facility] in [place]. Pt is also requesting a 90 day supply and states she originally was prescribed to take two a day.

## 2021-12-08 NOTE — Telephone Encounter (Signed)
If she feels prilosec is helping - confirm how she is taking - I think only q day is how she is taking.  If helping and taking daily then ok to refill prilosec 20mg  q day.  I had contacted GI after our last visit.  She should have a f/u appt scheduled with them.  Confirm.

## 2021-12-10 NOTE — Telephone Encounter (Signed)
Patient confirmed that she is taking BID. Script was sent to [facility] 1/10. Patient is aware. Confirmed GI appt has been scheduled for Feb 2023 with Dr Vicente Males

## 2021-12-30 ENCOUNTER — Other Ambulatory Visit: Payer: Self-pay | Admitting: Internal Medicine

## 2022-01-05 ENCOUNTER — Other Ambulatory Visit: Payer: Self-pay

## 2022-01-05 ENCOUNTER — Ambulatory Visit
Admission: RE | Admit: 2022-01-05 | Discharge: 2022-01-05 | Disposition: A | Discharge status: Home / Self Care | Payer: Medicare Other | Source: Ambulatory Visit | Attending: Otolaryngology | Admitting: Otolaryngology

## 2022-01-05 DIAGNOSIS — E041 Nontoxic single thyroid nodule: Secondary | ICD-10-CM | POA: Insufficient documentation

## 2022-01-06 ENCOUNTER — Ambulatory Visit: Payer: Medicare Other

## 2022-01-20 ENCOUNTER — Ambulatory Visit: Payer: Medicare Other | Admitting: Gastroenterology

## 2022-01-27 DEATH — deceased

## 2022-01-29 ENCOUNTER — Ambulatory Visit: Payer: Medicare Other | Admitting: Internal Medicine
